# Patient Record
Sex: Female | Born: 1935 | Race: White | Hispanic: No | Smoking: Never smoker
Health system: Southern US, Community
[De-identification: ages and names within clinical notes are randomized; demographics above are authoritative.]

## PROBLEM LIST (undated history)

## (undated) DIAGNOSIS — E079 Disorder of thyroid, unspecified: Secondary | ICD-10-CM

## (undated) DIAGNOSIS — I714 Abdominal aortic aneurysm, without rupture, unspecified: Secondary | ICD-10-CM

## (undated) DIAGNOSIS — H409 Unspecified glaucoma: Secondary | ICD-10-CM

## (undated) DIAGNOSIS — F0390 Unspecified dementia without behavioral disturbance: Secondary | ICD-10-CM

## (undated) DIAGNOSIS — K5792 Diverticulitis of intestine, part unspecified, without perforation or abscess without bleeding: Secondary | ICD-10-CM

## (undated) DIAGNOSIS — I10 Essential (primary) hypertension: Secondary | ICD-10-CM

## (undated) DIAGNOSIS — R32 Unspecified urinary incontinence: Secondary | ICD-10-CM

## (undated) HISTORY — DX: Essential (primary) hypertension: I10

## (undated) HISTORY — PX: HIP SURGERY: SHX245

## (undated) HISTORY — DX: Disorder of thyroid, unspecified: E07.9

## (undated) HISTORY — DX: Unspecified glaucoma: H40.9

## (undated) HISTORY — DX: Diverticulitis of intestine, part unspecified, without perforation or abscess without bleeding: K57.92

## (undated) HISTORY — DX: Unspecified urinary incontinence: R32

## (undated) HISTORY — PX: BLADDER SURGERY: SHX569

---

## 2013-04-18 DIAGNOSIS — I8310 Varicose veins of unspecified lower extremity with inflammation: Secondary | ICD-10-CM | POA: Diagnosis not present

## 2013-04-18 DIAGNOSIS — H61009 Unspecified perichondritis of external ear, unspecified ear: Secondary | ICD-10-CM | POA: Diagnosis not present

## 2013-04-18 DIAGNOSIS — B351 Tinea unguium: Secondary | ICD-10-CM | POA: Diagnosis not present

## 2013-05-17 DIAGNOSIS — R7989 Other specified abnormal findings of blood chemistry: Secondary | ICD-10-CM | POA: Diagnosis not present

## 2013-05-17 DIAGNOSIS — Z880 Allergy status to penicillin: Secondary | ICD-10-CM | POA: Diagnosis not present

## 2013-05-17 DIAGNOSIS — A498 Other bacterial infections of unspecified site: Secondary | ICD-10-CM | POA: Diagnosis present

## 2013-05-17 DIAGNOSIS — E039 Hypothyroidism, unspecified: Secondary | ICD-10-CM | POA: Diagnosis present

## 2013-05-17 DIAGNOSIS — Z7982 Long term (current) use of aspirin: Secondary | ICD-10-CM | POA: Diagnosis not present

## 2013-05-17 DIAGNOSIS — Z882 Allergy status to sulfonamides status: Secondary | ICD-10-CM | POA: Diagnosis not present

## 2013-05-17 DIAGNOSIS — R0902 Hypoxemia: Secondary | ICD-10-CM | POA: Diagnosis not present

## 2013-05-17 DIAGNOSIS — E876 Hypokalemia: Secondary | ICD-10-CM | POA: Diagnosis not present

## 2013-05-17 DIAGNOSIS — I10 Essential (primary) hypertension: Secondary | ICD-10-CM | POA: Diagnosis present

## 2013-05-17 DIAGNOSIS — E860 Dehydration: Secondary | ICD-10-CM | POA: Diagnosis not present

## 2013-05-17 DIAGNOSIS — J189 Pneumonia, unspecified organism: Secondary | ICD-10-CM | POA: Diagnosis not present

## 2013-05-17 DIAGNOSIS — R05 Cough: Secondary | ICD-10-CM | POA: Diagnosis not present

## 2013-05-17 DIAGNOSIS — N179 Acute kidney failure, unspecified: Secondary | ICD-10-CM | POA: Diagnosis not present

## 2013-05-17 DIAGNOSIS — N390 Urinary tract infection, site not specified: Secondary | ICD-10-CM | POA: Diagnosis not present

## 2013-05-17 DIAGNOSIS — R5381 Other malaise: Secondary | ICD-10-CM | POA: Diagnosis not present

## 2013-05-17 DIAGNOSIS — F0390 Unspecified dementia without behavioral disturbance: Secondary | ICD-10-CM | POA: Diagnosis not present

## 2013-05-17 DIAGNOSIS — Z91013 Allergy to seafood: Secondary | ICD-10-CM | POA: Diagnosis not present

## 2013-05-17 DIAGNOSIS — R059 Cough, unspecified: Secondary | ICD-10-CM | POA: Diagnosis not present

## 2013-05-17 DIAGNOSIS — R4182 Altered mental status, unspecified: Secondary | ICD-10-CM | POA: Diagnosis not present

## 2013-05-27 DIAGNOSIS — I10 Essential (primary) hypertension: Secondary | ICD-10-CM | POA: Diagnosis not present

## 2013-05-27 DIAGNOSIS — F0390 Unspecified dementia without behavioral disturbance: Secondary | ICD-10-CM | POA: Diagnosis not present

## 2013-05-27 DIAGNOSIS — E039 Hypothyroidism, unspecified: Secondary | ICD-10-CM | POA: Diagnosis not present

## 2013-05-27 DIAGNOSIS — R609 Edema, unspecified: Secondary | ICD-10-CM | POA: Diagnosis not present

## 2013-06-06 DIAGNOSIS — R609 Edema, unspecified: Secondary | ICD-10-CM | POA: Diagnosis not present

## 2013-07-11 DIAGNOSIS — L98499 Non-pressure chronic ulcer of skin of other sites with unspecified severity: Secondary | ICD-10-CM | POA: Diagnosis not present

## 2013-07-11 DIAGNOSIS — I8310 Varicose veins of unspecified lower extremity with inflammation: Secondary | ICD-10-CM | POA: Diagnosis not present

## 2013-07-11 DIAGNOSIS — H61009 Unspecified perichondritis of external ear, unspecified ear: Secondary | ICD-10-CM | POA: Diagnosis not present

## 2013-07-16 DIAGNOSIS — N390 Urinary tract infection, site not specified: Secondary | ICD-10-CM | POA: Diagnosis not present

## 2013-08-26 DIAGNOSIS — M549 Dorsalgia, unspecified: Secondary | ICD-10-CM | POA: Diagnosis not present

## 2013-09-05 DIAGNOSIS — D649 Anemia, unspecified: Secondary | ICD-10-CM | POA: Diagnosis not present

## 2013-09-05 DIAGNOSIS — E039 Hypothyroidism, unspecified: Secondary | ICD-10-CM | POA: Diagnosis not present

## 2013-09-05 DIAGNOSIS — Z5189 Encounter for other specified aftercare: Secondary | ICD-10-CM | POA: Diagnosis not present

## 2013-09-05 DIAGNOSIS — I10 Essential (primary) hypertension: Secondary | ICD-10-CM | POA: Diagnosis not present

## 2013-09-05 DIAGNOSIS — Z9181 History of falling: Secondary | ICD-10-CM | POA: Diagnosis not present

## 2013-09-05 DIAGNOSIS — F0390 Unspecified dementia without behavioral disturbance: Secondary | ICD-10-CM | POA: Diagnosis not present

## 2013-09-09 DIAGNOSIS — E039 Hypothyroidism, unspecified: Secondary | ICD-10-CM | POA: Diagnosis not present

## 2013-09-09 DIAGNOSIS — F0390 Unspecified dementia without behavioral disturbance: Secondary | ICD-10-CM | POA: Diagnosis not present

## 2013-09-09 DIAGNOSIS — D649 Anemia, unspecified: Secondary | ICD-10-CM | POA: Diagnosis not present

## 2013-09-09 DIAGNOSIS — Z9181 History of falling: Secondary | ICD-10-CM | POA: Diagnosis not present

## 2013-09-09 DIAGNOSIS — Z5189 Encounter for other specified aftercare: Secondary | ICD-10-CM | POA: Diagnosis not present

## 2013-09-09 DIAGNOSIS — I10 Essential (primary) hypertension: Secondary | ICD-10-CM | POA: Diagnosis not present

## 2013-09-16 DIAGNOSIS — E039 Hypothyroidism, unspecified: Secondary | ICD-10-CM | POA: Diagnosis not present

## 2013-09-16 DIAGNOSIS — I10 Essential (primary) hypertension: Secondary | ICD-10-CM | POA: Diagnosis not present

## 2013-09-16 DIAGNOSIS — F0390 Unspecified dementia without behavioral disturbance: Secondary | ICD-10-CM | POA: Diagnosis not present

## 2013-09-16 DIAGNOSIS — R609 Edema, unspecified: Secondary | ICD-10-CM | POA: Diagnosis not present

## 2013-09-18 DIAGNOSIS — Z5189 Encounter for other specified aftercare: Secondary | ICD-10-CM | POA: Diagnosis not present

## 2013-09-18 DIAGNOSIS — F0390 Unspecified dementia without behavioral disturbance: Secondary | ICD-10-CM | POA: Diagnosis not present

## 2013-09-18 DIAGNOSIS — Z9181 History of falling: Secondary | ICD-10-CM | POA: Diagnosis not present

## 2013-09-18 DIAGNOSIS — E039 Hypothyroidism, unspecified: Secondary | ICD-10-CM | POA: Diagnosis not present

## 2013-09-18 DIAGNOSIS — D649 Anemia, unspecified: Secondary | ICD-10-CM | POA: Diagnosis not present

## 2013-09-18 DIAGNOSIS — I10 Essential (primary) hypertension: Secondary | ICD-10-CM | POA: Diagnosis not present

## 2013-09-23 DIAGNOSIS — F0390 Unspecified dementia without behavioral disturbance: Secondary | ICD-10-CM | POA: Diagnosis not present

## 2013-09-23 DIAGNOSIS — D649 Anemia, unspecified: Secondary | ICD-10-CM | POA: Diagnosis not present

## 2013-09-23 DIAGNOSIS — I10 Essential (primary) hypertension: Secondary | ICD-10-CM | POA: Diagnosis not present

## 2013-09-23 DIAGNOSIS — Z9181 History of falling: Secondary | ICD-10-CM | POA: Diagnosis not present

## 2013-09-23 DIAGNOSIS — E039 Hypothyroidism, unspecified: Secondary | ICD-10-CM | POA: Diagnosis not present

## 2013-09-23 DIAGNOSIS — Z5189 Encounter for other specified aftercare: Secondary | ICD-10-CM | POA: Diagnosis not present

## 2013-09-24 DIAGNOSIS — D649 Anemia, unspecified: Secondary | ICD-10-CM | POA: Diagnosis not present

## 2013-09-24 DIAGNOSIS — I10 Essential (primary) hypertension: Secondary | ICD-10-CM | POA: Diagnosis not present

## 2013-09-24 DIAGNOSIS — E039 Hypothyroidism, unspecified: Secondary | ICD-10-CM | POA: Diagnosis not present

## 2013-09-24 DIAGNOSIS — F0390 Unspecified dementia without behavioral disturbance: Secondary | ICD-10-CM | POA: Diagnosis not present

## 2013-09-24 DIAGNOSIS — Z5189 Encounter for other specified aftercare: Secondary | ICD-10-CM | POA: Diagnosis not present

## 2013-09-24 DIAGNOSIS — Z9181 History of falling: Secondary | ICD-10-CM | POA: Diagnosis not present

## 2013-09-29 DIAGNOSIS — F0390 Unspecified dementia without behavioral disturbance: Secondary | ICD-10-CM | POA: Diagnosis not present

## 2013-09-29 DIAGNOSIS — I10 Essential (primary) hypertension: Secondary | ICD-10-CM | POA: Diagnosis not present

## 2013-09-29 DIAGNOSIS — D649 Anemia, unspecified: Secondary | ICD-10-CM | POA: Diagnosis not present

## 2013-09-29 DIAGNOSIS — E039 Hypothyroidism, unspecified: Secondary | ICD-10-CM | POA: Diagnosis not present

## 2013-09-30 DIAGNOSIS — F0390 Unspecified dementia without behavioral disturbance: Secondary | ICD-10-CM | POA: Diagnosis not present

## 2013-09-30 DIAGNOSIS — E039 Hypothyroidism, unspecified: Secondary | ICD-10-CM | POA: Diagnosis not present

## 2013-09-30 DIAGNOSIS — I10 Essential (primary) hypertension: Secondary | ICD-10-CM | POA: Diagnosis not present

## 2013-09-30 DIAGNOSIS — D649 Anemia, unspecified: Secondary | ICD-10-CM | POA: Diagnosis not present

## 2013-09-30 DIAGNOSIS — Z9181 History of falling: Secondary | ICD-10-CM | POA: Diagnosis not present

## 2013-09-30 DIAGNOSIS — Z5189 Encounter for other specified aftercare: Secondary | ICD-10-CM | POA: Diagnosis not present

## 2013-11-21 DIAGNOSIS — T148XXA Other injury of unspecified body region, initial encounter: Secondary | ICD-10-CM | POA: Diagnosis not present

## 2013-11-21 DIAGNOSIS — K5909 Other constipation: Secondary | ICD-10-CM | POA: Diagnosis not present

## 2013-11-21 DIAGNOSIS — K219 Gastro-esophageal reflux disease without esophagitis: Secondary | ICD-10-CM | POA: Diagnosis not present

## 2013-11-21 DIAGNOSIS — S34109A Unspecified injury to unspecified level of lumbar spinal cord, initial encounter: Secondary | ICD-10-CM | POA: Diagnosis not present

## 2013-11-21 DIAGNOSIS — M25559 Pain in unspecified hip: Secondary | ICD-10-CM | POA: Diagnosis not present

## 2013-11-21 DIAGNOSIS — A498 Other bacterial infections of unspecified site: Secondary | ICD-10-CM | POA: Diagnosis not present

## 2013-11-21 DIAGNOSIS — R339 Retention of urine, unspecified: Secondary | ICD-10-CM | POA: Diagnosis present

## 2013-11-21 DIAGNOSIS — F0390 Unspecified dementia without behavioral disturbance: Secondary | ICD-10-CM | POA: Diagnosis not present

## 2013-11-21 DIAGNOSIS — K449 Diaphragmatic hernia without obstruction or gangrene: Secondary | ICD-10-CM | POA: Diagnosis not present

## 2013-11-21 DIAGNOSIS — S72143A Displaced intertrochanteric fracture of unspecified femur, initial encounter for closed fracture: Secondary | ICD-10-CM | POA: Diagnosis not present

## 2013-11-21 DIAGNOSIS — Z9181 History of falling: Secondary | ICD-10-CM | POA: Diagnosis not present

## 2013-11-21 DIAGNOSIS — R918 Other nonspecific abnormal finding of lung field: Secondary | ICD-10-CM | POA: Diagnosis not present

## 2013-11-21 DIAGNOSIS — L89109 Pressure ulcer of unspecified part of back, unspecified stage: Secondary | ICD-10-CM | POA: Diagnosis not present

## 2013-11-21 DIAGNOSIS — Z66 Do not resuscitate: Secondary | ICD-10-CM | POA: Diagnosis present

## 2013-11-21 DIAGNOSIS — M6281 Muscle weakness (generalized): Secondary | ICD-10-CM | POA: Diagnosis not present

## 2013-11-21 DIAGNOSIS — M47812 Spondylosis without myelopathy or radiculopathy, cervical region: Secondary | ICD-10-CM | POA: Diagnosis not present

## 2013-11-21 DIAGNOSIS — E039 Hypothyroidism, unspecified: Secondary | ICD-10-CM | POA: Diagnosis present

## 2013-11-21 DIAGNOSIS — S72009D Fracture of unspecified part of neck of unspecified femur, subsequent encounter for closed fracture with routine healing: Secondary | ICD-10-CM | POA: Diagnosis not present

## 2013-11-21 DIAGNOSIS — Z471 Aftercare following joint replacement surgery: Secondary | ICD-10-CM | POA: Diagnosis not present

## 2013-11-21 DIAGNOSIS — R262 Difficulty in walking, not elsewhere classified: Secondary | ICD-10-CM | POA: Diagnosis not present

## 2013-11-21 DIAGNOSIS — N390 Urinary tract infection, site not specified: Secondary | ICD-10-CM | POA: Diagnosis not present

## 2013-11-21 DIAGNOSIS — L8992 Pressure ulcer of unspecified site, stage 2: Secondary | ICD-10-CM | POA: Diagnosis not present

## 2013-11-21 DIAGNOSIS — R279 Unspecified lack of coordination: Secondary | ICD-10-CM | POA: Diagnosis not present

## 2013-11-21 DIAGNOSIS — Z043 Encounter for examination and observation following other accident: Secondary | ICD-10-CM | POA: Diagnosis not present

## 2013-11-21 DIAGNOSIS — S72009A Fracture of unspecified part of neck of unspecified femur, initial encounter for closed fracture: Secondary | ICD-10-CM | POA: Diagnosis not present

## 2013-11-21 DIAGNOSIS — I10 Essential (primary) hypertension: Secondary | ICD-10-CM | POA: Diagnosis not present

## 2013-11-21 DIAGNOSIS — D62 Acute posthemorrhagic anemia: Secondary | ICD-10-CM | POA: Diagnosis not present

## 2013-11-27 DIAGNOSIS — S72143A Displaced intertrochanteric fracture of unspecified femur, initial encounter for closed fracture: Secondary | ICD-10-CM | POA: Diagnosis not present

## 2013-11-27 DIAGNOSIS — F0390 Unspecified dementia without behavioral disturbance: Secondary | ICD-10-CM | POA: Diagnosis not present

## 2013-11-27 DIAGNOSIS — K219 Gastro-esophageal reflux disease without esophagitis: Secondary | ICD-10-CM | POA: Diagnosis not present

## 2013-11-27 DIAGNOSIS — K5900 Constipation, unspecified: Secondary | ICD-10-CM | POA: Diagnosis not present

## 2013-11-27 DIAGNOSIS — N390 Urinary tract infection, site not specified: Secondary | ICD-10-CM | POA: Diagnosis not present

## 2013-11-27 DIAGNOSIS — F411 Generalized anxiety disorder: Secondary | ICD-10-CM | POA: Diagnosis not present

## 2013-11-27 DIAGNOSIS — Z471 Aftercare following joint replacement surgery: Secondary | ICD-10-CM | POA: Diagnosis not present

## 2013-11-27 DIAGNOSIS — D62 Acute posthemorrhagic anemia: Secondary | ICD-10-CM | POA: Diagnosis not present

## 2013-11-27 DIAGNOSIS — M25559 Pain in unspecified hip: Secondary | ICD-10-CM | POA: Diagnosis not present

## 2013-11-27 DIAGNOSIS — L8992 Pressure ulcer of unspecified site, stage 2: Secondary | ICD-10-CM | POA: Diagnosis not present

## 2013-11-27 DIAGNOSIS — E039 Hypothyroidism, unspecified: Secondary | ICD-10-CM | POA: Diagnosis not present

## 2013-11-27 DIAGNOSIS — R262 Difficulty in walking, not elsewhere classified: Secondary | ICD-10-CM | POA: Diagnosis not present

## 2013-11-27 DIAGNOSIS — M79609 Pain in unspecified limb: Secondary | ICD-10-CM | POA: Diagnosis not present

## 2013-11-27 DIAGNOSIS — I10 Essential (primary) hypertension: Secondary | ICD-10-CM | POA: Diagnosis not present

## 2013-11-27 DIAGNOSIS — B351 Tinea unguium: Secondary | ICD-10-CM | POA: Diagnosis not present

## 2013-11-27 DIAGNOSIS — K5909 Other constipation: Secondary | ICD-10-CM | POA: Diagnosis not present

## 2013-11-27 DIAGNOSIS — M6281 Muscle weakness (generalized): Secondary | ICD-10-CM | POA: Diagnosis not present

## 2013-11-27 DIAGNOSIS — R279 Unspecified lack of coordination: Secondary | ICD-10-CM | POA: Diagnosis not present

## 2013-11-27 DIAGNOSIS — L89109 Pressure ulcer of unspecified part of back, unspecified stage: Secondary | ICD-10-CM | POA: Diagnosis not present

## 2013-11-27 DIAGNOSIS — S72009D Fracture of unspecified part of neck of unspecified femur, subsequent encounter for closed fracture with routine healing: Secondary | ICD-10-CM | POA: Diagnosis not present

## 2013-11-27 DIAGNOSIS — Z9181 History of falling: Secondary | ICD-10-CM | POA: Diagnosis not present

## 2013-11-29 ENCOUNTER — Encounter: Payer: Self-pay | Admitting: Adult Health

## 2013-11-29 ENCOUNTER — Non-Acute Institutional Stay (SKILLED_NURSING_FACILITY): Payer: Medicare Other | Admitting: Adult Health

## 2013-11-29 DIAGNOSIS — E039 Hypothyroidism, unspecified: Secondary | ICD-10-CM | POA: Insufficient documentation

## 2013-11-29 DIAGNOSIS — K219 Gastro-esophageal reflux disease without esophagitis: Secondary | ICD-10-CM | POA: Diagnosis not present

## 2013-11-29 DIAGNOSIS — I10 Essential (primary) hypertension: Secondary | ICD-10-CM

## 2013-11-29 DIAGNOSIS — S72141A Displaced intertrochanteric fracture of right femur, initial encounter for closed fracture: Secondary | ICD-10-CM | POA: Insufficient documentation

## 2013-11-29 DIAGNOSIS — F419 Anxiety disorder, unspecified: Secondary | ICD-10-CM | POA: Insufficient documentation

## 2013-11-29 DIAGNOSIS — F411 Generalized anxiety disorder: Secondary | ICD-10-CM

## 2013-11-29 DIAGNOSIS — S72143A Displaced intertrochanteric fracture of unspecified femur, initial encounter for closed fracture: Secondary | ICD-10-CM | POA: Diagnosis not present

## 2013-11-29 DIAGNOSIS — N390 Urinary tract infection, site not specified: Secondary | ICD-10-CM

## 2013-11-29 DIAGNOSIS — K5900 Constipation, unspecified: Secondary | ICD-10-CM

## 2013-11-29 DIAGNOSIS — F0390 Unspecified dementia without behavioral disturbance: Secondary | ICD-10-CM

## 2013-11-29 DIAGNOSIS — D62 Acute posthemorrhagic anemia: Secondary | ICD-10-CM

## 2013-11-29 NOTE — Progress Notes (Signed)
Patient ID: Virginia Davidson, female   DOB: 24-Jun-1916, 80 y.o.   MRN: [id number]               PROGRESS NOTE  DATE: 11/29/2013  FACILITY: Nursing Home Location: [facility]  LEVEL OF CARE: SNF (31)  Acute Visit  CHIEF COMPLAINT:  Follow-up Hospitalization  HISTORY OF PRESENT ILLNESS: This is a 80 year old female who has been admitted to [facility] on 11/27/13 from [facility] in [place] with her I intratrochanteric fracture, right hip status post ORIF and IM screw. She has been admitted for a short-term rehabilitation.    REASSESSMENT OF ONGOING PROBLEM(S):  HTN: Pt 's HTN remains stable.  Denies CP, sob, DOE, pedal edema, headaches, dizziness or visual disturbances.  No complications from the medications currently being used.  Last BP : 118/60  UTI: The UTI remains stable.  The patient denies ongoing suprapubic pain, flank pain, dysuria, urinary frequency, urinary hesitancy or hematuria.  No complications reported from the current antibiotic being used.  DEMENTIA: The dementia remaines stable and continues to function adequately in the current living environment with supervision.  The patient has had little changes in behavior. No complications noted from the medications presently being used.   PAST MEDICAL HISTORY : Reviewed.  No changes/see problem list  CURRENT MEDICATIONS: Reviewed per MAR/see medication list  REVIEW OF SYSTEMS: unable to asses; patient is sleepy  PHYSICAL EXAMINATION  GENERAL: no acute distress, normal body habitus SKIN:  Right hip surgical site is dry, no erythema NECK: supple, trachea midline, no neck masses, no thyroid tenderness, no thyromegaly LYMPHATICS: no LAN in the neck, no supraclavicular LAN RESPIRATORY: breathing is even & unlabored, BS CTAB CARDIAC: RRR, no murmur,no extra heart sounds, no edema GI: abdomen soft, normal BS, no masses, no tenderness, no hepatomegaly, no splenomegaly PSYCHIATRIC: the  patient is alert & oriented to person, affect & behavior appropriate  LABS/RADIOLOGY: 11/24/13  WBC 8.1 hemoglobin 10.3 hematocrit 31.5 MCV 95.7 11/21/13  sodium 141 potassium 3.5 BUN 27 creatinine 1.10 calcium 9.7 glucose 162  ASSESSMENT/PLAN:  Right intertrochanteric fracture, right  status post ORIF and IM Screw - for rehabilitation Anxiety - decrease Ativan to 0.25 mg by mouth each bedtime when necessary Right hip pain - discontinue Norco due to sedation; start old from 50 mg daily one half tab = 25 mg by mouth every 4 hours when necessary UTI - continue Ceftin Dementia - continue Namenda XR Constipation - continue Colace Hypertension - well controlled; continue lisinopril GERD - continue Prilosec Anemia, acute blood loss - check CBC Hypothyroidism - continue Synthroid   CPT CODE: 70962  Lyan Holck Vargas - NP [facility] [phone]

## 2013-12-03 ENCOUNTER — Non-Acute Institutional Stay (SKILLED_NURSING_FACILITY): Payer: Medicare Other | Admitting: Internal Medicine

## 2013-12-03 DIAGNOSIS — S72143A Displaced intertrochanteric fracture of unspecified femur, initial encounter for closed fracture: Secondary | ICD-10-CM | POA: Diagnosis not present

## 2013-12-03 DIAGNOSIS — S72141A Displaced intertrochanteric fracture of right femur, initial encounter for closed fracture: Secondary | ICD-10-CM

## 2013-12-03 DIAGNOSIS — D62 Acute posthemorrhagic anemia: Secondary | ICD-10-CM

## 2013-12-03 DIAGNOSIS — F0390 Unspecified dementia without behavioral disturbance: Secondary | ICD-10-CM | POA: Diagnosis not present

## 2013-12-03 DIAGNOSIS — K5900 Constipation, unspecified: Secondary | ICD-10-CM

## 2013-12-11 DIAGNOSIS — S72143A Displaced intertrochanteric fracture of unspecified femur, initial encounter for closed fracture: Secondary | ICD-10-CM | POA: Diagnosis not present

## 2013-12-12 NOTE — Progress Notes (Signed)
Patient ID: Virginia Davidson, female   DOB: [birth year], 80 y.o.   MRN: [id number]                HISTORY & PHYSICAL  DATE: 12/03/2013            FACILITY: [facility]  LEVEL OF CARE: SNF (31)  ALLERGIES:   Penicillins.    Sulfa.    Shellfish.    Tetracycline.    CHIEF COMPLAINT:  Manage right hip fracture, acute blood loss anemia, and dementia.    HISTORY OF PRESENT ILLNESS:  Patient is a 80 year-old, Caucasian female.    HIP FRACTURE: The patient had a mechanical fall and sustained a femur fracture.  Patient subsequently underwent surgical repair and tolerated the procedure well. Patient is admitted to this facility for short-term rehabilitation. Patient denies hip pain currently. No complications reported from the pain medications currently being used.    ANEMIA:  Postoperatively, patient suffered acute blood loss.  The anemia has been stable. The patient denies fatigue, melena or hematochezia. No complications from the medications currently being used.  Last hemoglobin was 10.3.     DEMENTIA: The dementia remains stable and continues to function adequately in the current living environment with supervision.  The patient has had little changes in behavior. No complications noted from the medications presently being used.  Overall, patient is a poor historian.    PAST MEDICAL HISTORY :   Dementia.    Hypertension.    Hypothyroidism.     GERD.    Hypokalemia.    Anxiety.    PAST SURGICAL HISTORY:  Bladder surgery x2.    SOCIAL HISTORY:  reports that she has never smoked. She has never used smokeless tobacco. She reports that she does not drink alcohol or use illicit drugs.  FAMILY HISTORY:   Patient cannot recall.    CURRENT MEDICATIONS: Reviewed per MAR/see medication list  REVIEW OF SYSTEMS:  Difficult to obtain.  Patient is a poor historian due to dementia.  However, she does complain of constipation.    See HPI otherwise 14 point ROS is  negative.  PHYSICAL EXAMINATION  VS:  See VS section  GENERAL: no acute distress, normal body habitus EYES: conjunctivae normal, sclerae normal, normal eye lids MOUTH/THROAT: lips without lesions,no lesions in the mouth,tongue is without lesions,uvula elevates in midline NECK: supple, trachea midline, no neck masses, no thyroid tenderness, no thyromegaly LYMPHATICS: no LAN in the neck, no supraclavicular LAN RESPIRATORY: breathing is even & unlabored, BS CTAB CARDIAC: RRR, no murmur,no extra heart sounds, no edema GI:  ABDOMEN: abdomen soft, normal BS, no masses, no tenderness  LIVER/SPLEEN: no hepatomegaly, no splenomegaly MUSCULOSKELETAL: HEAD: normal to inspection  EXTREMITIES: LEFT UPPER EXTREMITY: strength decreased, range of motion normal   RIGHT UPPER EXTREMITY: strength decreased, range of motion normal    LEFT LOWER EXTREMITY: unable to assess   RIGHT LOWER EXTREMITY: not tested due to surgery     PSYCHIATRIC: the patient is alert & oriented to person, affect & behavior appropriate  LABS/RADIOLOGY:  WBC 8.1, hemoglobin 10.3, platelets 182.    Creatinine 1.1.    Electrolytes normal.     Urine culture:  Positive for E.coli.    11/21/2013:  Chest x-ray:  Hiatal hernia.  Left lung base parenchymal opacity.    Right hip x-ray:  Right femur intertrochanteric fracture  ASSESSMENT/PLAN:  Right hip intertrochanteric fracture.  Status post ORIF.  Continue rehabilitation.    Acute blood loss anemia.  Recheck hemoglobin.    Dementia.  Stable.    Constipation.  New problem.  Start MiraLAX 17 g q.d.    Hypothyroidism.  Continue levothyroxine.    Hiatal hernia.  Continue Prilosec.    Hypokalemia.  Continue potassium supplementation.     Hypertension.  Well controlled.     Check CBC with diff and BMP.    I have reviewed patient's medical records received at admission/from hospitalization.  CPT CODE: 93716           Laura Caldas Y Kiki Bivens, [place] [phone]

## 2013-12-13 ENCOUNTER — Encounter: Payer: Self-pay | Admitting: *Deleted

## 2013-12-18 ENCOUNTER — Other Ambulatory Visit: Payer: Self-pay | Admitting: *Deleted

## 2013-12-18 MED ORDER — TRAMADOL HCL 50 MG PO TABS: ORAL_TABLET | ORAL | Status: DC

## 2013-12-18 NOTE — Telephone Encounter (Signed)
[facility] 

## 2013-12-24 ENCOUNTER — Encounter: Payer: Self-pay | Admitting: Adult Health

## 2013-12-24 ENCOUNTER — Non-Acute Institutional Stay (SKILLED_NURSING_FACILITY): Payer: Medicare Other | Admitting: Adult Health

## 2013-12-24 DIAGNOSIS — F0390 Unspecified dementia without behavioral disturbance: Secondary | ICD-10-CM | POA: Diagnosis not present

## 2013-12-24 DIAGNOSIS — I10 Essential (primary) hypertension: Secondary | ICD-10-CM | POA: Diagnosis not present

## 2013-12-24 DIAGNOSIS — F411 Generalized anxiety disorder: Secondary | ICD-10-CM | POA: Diagnosis not present

## 2013-12-24 DIAGNOSIS — S72009D Fracture of unspecified part of neck of unspecified femur, subsequent encounter for closed fracture with routine healing: Secondary | ICD-10-CM | POA: Diagnosis not present

## 2013-12-24 DIAGNOSIS — K219 Gastro-esophageal reflux disease without esophagitis: Secondary | ICD-10-CM

## 2013-12-24 DIAGNOSIS — S72141D Displaced intertrochanteric fracture of right femur, subsequent encounter for closed fracture with routine healing: Secondary | ICD-10-CM

## 2013-12-24 DIAGNOSIS — K5900 Constipation, unspecified: Secondary | ICD-10-CM

## 2013-12-24 DIAGNOSIS — F419 Anxiety disorder, unspecified: Secondary | ICD-10-CM

## 2013-12-24 DIAGNOSIS — D62 Acute posthemorrhagic anemia: Secondary | ICD-10-CM

## 2013-12-24 NOTE — Progress Notes (Signed)
Patient ID: Virginia Davidson, female   DOB: 1917/01/21, 80 y.o.   MRN: [id number]              PROGRESS NOTE  DATE:       12/24/13  FACILITY: Nursing Home Location: [facility]  LEVEL OF CARE: SNF (31)  Acute Visit  Discharge Notes  HISTORY OF PRESENT ILLNESS: This is a 80 year old female who is for discharge home with home health PT, OT and Nursing . DME: Wheelchair  18" X 18"  with elevating leg rest, desk arms and cushion. She has been admitted to [facility] on 11/27/13 from  [facility] in [place] with intertrochanteric fracture, right hip status post ORIF and IM screw. Patient was admitted to this facility for short-term rehabilitation after the patient's recent hospitalization.  Patient has completed SNF rehabilitation and therapy has cleared the patient for discharge.  REASSESSMENT OF ONGOING PROBLEM(S):  HTN: Pt 's HTN remains stable.  Denies CP, sob, DOE, pedal edema, headaches, dizziness or visual disturbances.  No complications from the medications currently being used.  Last BP : 130/68  GERD: pt's GERD is stable.  Denies ongoing heartburn, abd. Pain, nausea or vomiting.  Currently on a PPI & tolerates it without any adverse reactions.  ANEMIA: The anemia has been stable. The patient denies fatigue, melena or hematochezia. No complications from the medications currently being used. 9/15 hgb 11.3  PAST MEDICAL HISTORY : Reviewed.  No changes/see problem list  CURRENT MEDICATIONS: Reviewed per MAR/see medication list  REVIEW OF SYSTEMS: unable to asses; patient is sleepy  PHYSICAL EXAMINATION  GENERAL: no acute distress, normal body habitus SKIN:  Right hip surgical site is dry, no erythema NECK: supple, trachea midline, no neck masses, no thyroid tenderness, no thyromegaly LYMPHATICS: no LAN in the neck, no supraclavicular LAN RESPIRATORY: breathing is even & unlabored, BS CTAB CARDIAC: RRR, no murmur,no extra heart sounds, no  edema GI: abdomen soft, normal BS, no masses, no tenderness, no hepatomegaly, no splenomegaly PSYCHIATRIC: the patient is alert & oriented to person, affect & behavior appropriate  LABS/RADIOLOGY: 12/03/13  WBC 7.9 hemoglobin 11.3 hematocrit 38.2 MCV 101.1 11/24/13  WBC 8.1 hemoglobin 10.3 hematocrit 31.5 MCV 95.7 11/21/13  sodium 141 potassium 3.5 BUN 27 creatinine 1.10 calcium 9.7 glucose 162  ASSESSMENT/PLAN:  Right intertrochanteric fracture, right  status post ORIF and IM Screw - for home health PT, OT and Nursing Anxiety - decrease Ativan to 0.25 mg by mouth each bedtime when necessary Dementia - continue Namenda XR Constipation - continue Colace Hypertension - well controlled; continue lisinopril GERD - continue Prilosec Anemia, acute blood loss - stable Hypothyroidism - continue Synthroid    I have filled out patient's discharge paperwork and written prescriptions.  Patient will receive home health PT, OT and Nursing.  DME provided:  Wheelchair  18" X 18"  with elevating leg rest, desk arms and cushion  Total discharge time: Greater than 30 minutes  Discharge time involved coordination of the discharge process with social worker, nursing staff and therapy department. Medical justification for home health services/DME verified.  CPT CODE: 48016  Seth Bake - NP [facility] [phone]

## 2013-12-26 DIAGNOSIS — G309 Alzheimer's disease, unspecified: Secondary | ICD-10-CM | POA: Diagnosis not present

## 2013-12-26 DIAGNOSIS — E559 Vitamin D deficiency, unspecified: Secondary | ICD-10-CM | POA: Diagnosis not present

## 2013-12-26 DIAGNOSIS — E039 Hypothyroidism, unspecified: Secondary | ICD-10-CM | POA: Diagnosis not present

## 2013-12-26 DIAGNOSIS — D649 Anemia, unspecified: Secondary | ICD-10-CM | POA: Diagnosis not present

## 2013-12-26 DIAGNOSIS — D539 Nutritional anemia, unspecified: Secondary | ICD-10-CM | POA: Diagnosis not present

## 2013-12-26 DIAGNOSIS — F0390 Unspecified dementia without behavioral disturbance: Secondary | ICD-10-CM | POA: Diagnosis not present

## 2013-12-26 DIAGNOSIS — Z23 Encounter for immunization: Secondary | ICD-10-CM | POA: Diagnosis not present

## 2013-12-27 DIAGNOSIS — I10 Essential (primary) hypertension: Secondary | ICD-10-CM | POA: Diagnosis not present

## 2013-12-27 DIAGNOSIS — F0390 Unspecified dementia without behavioral disturbance: Secondary | ICD-10-CM

## 2013-12-27 DIAGNOSIS — L89152 Pressure ulcer of sacral region, stage 2: Secondary | ICD-10-CM

## 2013-12-27 DIAGNOSIS — S72141D Displaced intertrochanteric fracture of right femur, subsequent encounter for closed fracture with routine healing: Secondary | ICD-10-CM

## 2013-12-27 DIAGNOSIS — R269 Unspecified abnormalities of gait and mobility: Secondary | ICD-10-CM | POA: Diagnosis not present

## 2013-12-30 DIAGNOSIS — S72141D Displaced intertrochanteric fracture of right femur, subsequent encounter for closed fracture with routine healing: Secondary | ICD-10-CM | POA: Diagnosis not present

## 2013-12-30 DIAGNOSIS — I10 Essential (primary) hypertension: Secondary | ICD-10-CM | POA: Diagnosis not present

## 2013-12-30 DIAGNOSIS — R269 Unspecified abnormalities of gait and mobility: Secondary | ICD-10-CM | POA: Diagnosis not present

## 2013-12-30 DIAGNOSIS — L89152 Pressure ulcer of sacral region, stage 2: Secondary | ICD-10-CM | POA: Diagnosis not present

## 2013-12-30 DIAGNOSIS — F0390 Unspecified dementia without behavioral disturbance: Secondary | ICD-10-CM | POA: Diagnosis not present

## 2013-12-31 DIAGNOSIS — R413 Other amnesia: Secondary | ICD-10-CM | POA: Diagnosis not present

## 2014-01-01 DIAGNOSIS — L89152 Pressure ulcer of sacral region, stage 2: Secondary | ICD-10-CM | POA: Diagnosis not present

## 2014-01-01 DIAGNOSIS — R269 Unspecified abnormalities of gait and mobility: Secondary | ICD-10-CM | POA: Diagnosis not present

## 2014-01-01 DIAGNOSIS — S72141D Displaced intertrochanteric fracture of right femur, subsequent encounter for closed fracture with routine healing: Secondary | ICD-10-CM | POA: Diagnosis not present

## 2014-01-01 DIAGNOSIS — F0390 Unspecified dementia without behavioral disturbance: Secondary | ICD-10-CM | POA: Diagnosis not present

## 2014-01-01 DIAGNOSIS — I10 Essential (primary) hypertension: Secondary | ICD-10-CM | POA: Diagnosis not present

## 2014-01-03 DIAGNOSIS — I10 Essential (primary) hypertension: Secondary | ICD-10-CM | POA: Diagnosis not present

## 2014-01-03 DIAGNOSIS — F0390 Unspecified dementia without behavioral disturbance: Secondary | ICD-10-CM | POA: Diagnosis not present

## 2014-01-03 DIAGNOSIS — L89152 Pressure ulcer of sacral region, stage 2: Secondary | ICD-10-CM | POA: Diagnosis not present

## 2014-01-03 DIAGNOSIS — R269 Unspecified abnormalities of gait and mobility: Secondary | ICD-10-CM | POA: Diagnosis not present

## 2014-01-03 DIAGNOSIS — S72141D Displaced intertrochanteric fracture of right femur, subsequent encounter for closed fracture with routine healing: Secondary | ICD-10-CM | POA: Diagnosis not present

## 2014-01-05 DIAGNOSIS — G309 Alzheimer's disease, unspecified: Secondary | ICD-10-CM | POA: Diagnosis not present

## 2014-01-05 DIAGNOSIS — E039 Hypothyroidism, unspecified: Secondary | ICD-10-CM | POA: Diagnosis not present

## 2014-01-05 DIAGNOSIS — F0390 Unspecified dementia without behavioral disturbance: Secondary | ICD-10-CM | POA: Diagnosis not present

## 2014-01-05 DIAGNOSIS — I10 Essential (primary) hypertension: Secondary | ICD-10-CM | POA: Diagnosis not present

## 2014-01-06 DIAGNOSIS — S72141D Displaced intertrochanteric fracture of right femur, subsequent encounter for closed fracture with routine healing: Secondary | ICD-10-CM | POA: Diagnosis not present

## 2014-01-06 DIAGNOSIS — R269 Unspecified abnormalities of gait and mobility: Secondary | ICD-10-CM | POA: Diagnosis not present

## 2014-01-06 DIAGNOSIS — L89152 Pressure ulcer of sacral region, stage 2: Secondary | ICD-10-CM | POA: Diagnosis not present

## 2014-01-06 DIAGNOSIS — F0390 Unspecified dementia without behavioral disturbance: Secondary | ICD-10-CM | POA: Diagnosis not present

## 2014-01-06 DIAGNOSIS — I10 Essential (primary) hypertension: Secondary | ICD-10-CM | POA: Diagnosis not present

## 2014-01-07 DIAGNOSIS — R3981 Functional urinary incontinence: Secondary | ICD-10-CM | POA: Diagnosis not present

## 2014-01-07 DIAGNOSIS — N390 Urinary tract infection, site not specified: Secondary | ICD-10-CM | POA: Diagnosis not present

## 2014-01-08 DIAGNOSIS — F0390 Unspecified dementia without behavioral disturbance: Secondary | ICD-10-CM | POA: Diagnosis not present

## 2014-01-08 DIAGNOSIS — R269 Unspecified abnormalities of gait and mobility: Secondary | ICD-10-CM | POA: Diagnosis not present

## 2014-01-08 DIAGNOSIS — S72141D Displaced intertrochanteric fracture of right femur, subsequent encounter for closed fracture with routine healing: Secondary | ICD-10-CM | POA: Diagnosis not present

## 2014-01-08 DIAGNOSIS — L89152 Pressure ulcer of sacral region, stage 2: Secondary | ICD-10-CM | POA: Diagnosis not present

## 2014-01-08 DIAGNOSIS — I10 Essential (primary) hypertension: Secondary | ICD-10-CM | POA: Diagnosis not present

## 2014-01-09 DIAGNOSIS — E8809 Other disorders of plasma-protein metabolism, not elsewhere classified: Secondary | ICD-10-CM | POA: Diagnosis not present

## 2014-01-09 DIAGNOSIS — E039 Hypothyroidism, unspecified: Secondary | ICD-10-CM | POA: Diagnosis not present

## 2014-01-09 DIAGNOSIS — E559 Vitamin D deficiency, unspecified: Secondary | ICD-10-CM | POA: Diagnosis not present

## 2014-01-09 DIAGNOSIS — F419 Anxiety disorder, unspecified: Secondary | ICD-10-CM | POA: Diagnosis not present

## 2014-01-10 DIAGNOSIS — S72141D Displaced intertrochanteric fracture of right femur, subsequent encounter for closed fracture with routine healing: Secondary | ICD-10-CM | POA: Diagnosis not present

## 2014-01-10 DIAGNOSIS — L89152 Pressure ulcer of sacral region, stage 2: Secondary | ICD-10-CM | POA: Diagnosis not present

## 2014-01-10 DIAGNOSIS — I10 Essential (primary) hypertension: Secondary | ICD-10-CM | POA: Diagnosis not present

## 2014-01-10 DIAGNOSIS — R269 Unspecified abnormalities of gait and mobility: Secondary | ICD-10-CM | POA: Diagnosis not present

## 2014-01-10 DIAGNOSIS — F0390 Unspecified dementia without behavioral disturbance: Secondary | ICD-10-CM | POA: Diagnosis not present

## 2014-01-13 DIAGNOSIS — L89152 Pressure ulcer of sacral region, stage 2: Secondary | ICD-10-CM | POA: Diagnosis not present

## 2014-01-13 DIAGNOSIS — R269 Unspecified abnormalities of gait and mobility: Secondary | ICD-10-CM | POA: Diagnosis not present

## 2014-01-13 DIAGNOSIS — I10 Essential (primary) hypertension: Secondary | ICD-10-CM | POA: Diagnosis not present

## 2014-01-13 DIAGNOSIS — S72141D Displaced intertrochanteric fracture of right femur, subsequent encounter for closed fracture with routine healing: Secondary | ICD-10-CM | POA: Diagnosis not present

## 2014-01-13 DIAGNOSIS — F0390 Unspecified dementia without behavioral disturbance: Secondary | ICD-10-CM | POA: Diagnosis not present

## 2014-01-14 DIAGNOSIS — R269 Unspecified abnormalities of gait and mobility: Secondary | ICD-10-CM | POA: Diagnosis not present

## 2014-01-14 DIAGNOSIS — L89152 Pressure ulcer of sacral region, stage 2: Secondary | ICD-10-CM | POA: Diagnosis not present

## 2014-01-14 DIAGNOSIS — F0390 Unspecified dementia without behavioral disturbance: Secondary | ICD-10-CM | POA: Diagnosis not present

## 2014-01-14 DIAGNOSIS — S72141D Displaced intertrochanteric fracture of right femur, subsequent encounter for closed fracture with routine healing: Secondary | ICD-10-CM | POA: Diagnosis not present

## 2014-01-14 DIAGNOSIS — I10 Essential (primary) hypertension: Secondary | ICD-10-CM | POA: Diagnosis not present

## 2014-01-15 DIAGNOSIS — S72143D Displaced intertrochanteric fracture of unspecified femur, subsequent encounter for closed fracture with routine healing: Secondary | ICD-10-CM | POA: Diagnosis not present

## 2014-01-15 DIAGNOSIS — F0390 Unspecified dementia without behavioral disturbance: Secondary | ICD-10-CM | POA: Diagnosis not present

## 2014-01-15 DIAGNOSIS — S72141D Displaced intertrochanteric fracture of right femur, subsequent encounter for closed fracture with routine healing: Secondary | ICD-10-CM | POA: Diagnosis not present

## 2014-01-15 DIAGNOSIS — I10 Essential (primary) hypertension: Secondary | ICD-10-CM | POA: Diagnosis not present

## 2014-01-15 DIAGNOSIS — L89152 Pressure ulcer of sacral region, stage 2: Secondary | ICD-10-CM | POA: Diagnosis not present

## 2014-01-15 DIAGNOSIS — R269 Unspecified abnormalities of gait and mobility: Secondary | ICD-10-CM | POA: Diagnosis not present

## 2014-01-16 DIAGNOSIS — I10 Essential (primary) hypertension: Secondary | ICD-10-CM | POA: Diagnosis not present

## 2014-01-16 DIAGNOSIS — F0390 Unspecified dementia without behavioral disturbance: Secondary | ICD-10-CM | POA: Diagnosis not present

## 2014-01-16 DIAGNOSIS — F419 Anxiety disorder, unspecified: Secondary | ICD-10-CM | POA: Diagnosis not present

## 2014-01-16 DIAGNOSIS — E559 Vitamin D deficiency, unspecified: Secondary | ICD-10-CM | POA: Diagnosis not present

## 2014-01-17 DIAGNOSIS — I10 Essential (primary) hypertension: Secondary | ICD-10-CM | POA: Diagnosis not present

## 2014-01-17 DIAGNOSIS — L89152 Pressure ulcer of sacral region, stage 2: Secondary | ICD-10-CM | POA: Diagnosis not present

## 2014-01-17 DIAGNOSIS — R269 Unspecified abnormalities of gait and mobility: Secondary | ICD-10-CM | POA: Diagnosis not present

## 2014-01-17 DIAGNOSIS — F0390 Unspecified dementia without behavioral disturbance: Secondary | ICD-10-CM | POA: Diagnosis not present

## 2014-01-17 DIAGNOSIS — S72141D Displaced intertrochanteric fracture of right femur, subsequent encounter for closed fracture with routine healing: Secondary | ICD-10-CM | POA: Diagnosis not present

## 2014-01-20 DIAGNOSIS — S72141D Displaced intertrochanteric fracture of right femur, subsequent encounter for closed fracture with routine healing: Secondary | ICD-10-CM | POA: Diagnosis not present

## 2014-01-20 DIAGNOSIS — B351 Tinea unguium: Secondary | ICD-10-CM | POA: Diagnosis not present

## 2014-01-20 DIAGNOSIS — I10 Essential (primary) hypertension: Secondary | ICD-10-CM | POA: Diagnosis not present

## 2014-01-20 DIAGNOSIS — K439 Ventral hernia without obstruction or gangrene: Secondary | ICD-10-CM | POA: Diagnosis not present

## 2014-01-20 DIAGNOSIS — M79609 Pain in unspecified limb: Secondary | ICD-10-CM | POA: Diagnosis not present

## 2014-01-20 DIAGNOSIS — F0390 Unspecified dementia without behavioral disturbance: Secondary | ICD-10-CM | POA: Diagnosis not present

## 2014-01-20 DIAGNOSIS — L89152 Pressure ulcer of sacral region, stage 2: Secondary | ICD-10-CM | POA: Diagnosis not present

## 2014-01-20 DIAGNOSIS — R269 Unspecified abnormalities of gait and mobility: Secondary | ICD-10-CM | POA: Diagnosis not present

## 2014-01-21 DIAGNOSIS — I10 Essential (primary) hypertension: Secondary | ICD-10-CM | POA: Diagnosis not present

## 2014-01-21 DIAGNOSIS — S72141D Displaced intertrochanteric fracture of right femur, subsequent encounter for closed fracture with routine healing: Secondary | ICD-10-CM | POA: Diagnosis not present

## 2014-01-21 DIAGNOSIS — L89152 Pressure ulcer of sacral region, stage 2: Secondary | ICD-10-CM | POA: Diagnosis not present

## 2014-01-21 DIAGNOSIS — F0390 Unspecified dementia without behavioral disturbance: Secondary | ICD-10-CM | POA: Diagnosis not present

## 2014-01-21 DIAGNOSIS — R269 Unspecified abnormalities of gait and mobility: Secondary | ICD-10-CM | POA: Diagnosis not present

## 2014-01-23 DIAGNOSIS — R269 Unspecified abnormalities of gait and mobility: Secondary | ICD-10-CM | POA: Diagnosis not present

## 2014-01-23 DIAGNOSIS — L89152 Pressure ulcer of sacral region, stage 2: Secondary | ICD-10-CM | POA: Diagnosis not present

## 2014-01-23 DIAGNOSIS — S72141D Displaced intertrochanteric fracture of right femur, subsequent encounter for closed fracture with routine healing: Secondary | ICD-10-CM | POA: Diagnosis not present

## 2014-01-23 DIAGNOSIS — F0390 Unspecified dementia without behavioral disturbance: Secondary | ICD-10-CM | POA: Diagnosis not present

## 2014-01-23 DIAGNOSIS — I10 Essential (primary) hypertension: Secondary | ICD-10-CM | POA: Diagnosis not present

## 2014-01-28 DIAGNOSIS — S72141D Displaced intertrochanteric fracture of right femur, subsequent encounter for closed fracture with routine healing: Secondary | ICD-10-CM | POA: Diagnosis not present

## 2014-01-28 DIAGNOSIS — I10 Essential (primary) hypertension: Secondary | ICD-10-CM | POA: Diagnosis not present

## 2014-01-28 DIAGNOSIS — F0390 Unspecified dementia without behavioral disturbance: Secondary | ICD-10-CM | POA: Diagnosis not present

## 2014-01-28 DIAGNOSIS — R269 Unspecified abnormalities of gait and mobility: Secondary | ICD-10-CM | POA: Diagnosis not present

## 2014-01-28 DIAGNOSIS — L89152 Pressure ulcer of sacral region, stage 2: Secondary | ICD-10-CM | POA: Diagnosis not present

## 2014-01-29 DIAGNOSIS — F0390 Unspecified dementia without behavioral disturbance: Secondary | ICD-10-CM | POA: Diagnosis not present

## 2014-01-29 DIAGNOSIS — S72141D Displaced intertrochanteric fracture of right femur, subsequent encounter for closed fracture with routine healing: Secondary | ICD-10-CM | POA: Diagnosis not present

## 2014-01-29 DIAGNOSIS — L89152 Pressure ulcer of sacral region, stage 2: Secondary | ICD-10-CM | POA: Diagnosis not present

## 2014-01-29 DIAGNOSIS — R269 Unspecified abnormalities of gait and mobility: Secondary | ICD-10-CM | POA: Diagnosis not present

## 2014-01-29 DIAGNOSIS — I10 Essential (primary) hypertension: Secondary | ICD-10-CM | POA: Diagnosis not present

## 2014-01-30 DIAGNOSIS — F0390 Unspecified dementia without behavioral disturbance: Secondary | ICD-10-CM | POA: Diagnosis not present

## 2014-01-30 DIAGNOSIS — I10 Essential (primary) hypertension: Secondary | ICD-10-CM | POA: Diagnosis not present

## 2014-01-30 DIAGNOSIS — L89152 Pressure ulcer of sacral region, stage 2: Secondary | ICD-10-CM | POA: Diagnosis not present

## 2014-01-30 DIAGNOSIS — S72141D Displaced intertrochanteric fracture of right femur, subsequent encounter for closed fracture with routine healing: Secondary | ICD-10-CM | POA: Diagnosis not present

## 2014-01-30 DIAGNOSIS — R269 Unspecified abnormalities of gait and mobility: Secondary | ICD-10-CM | POA: Diagnosis not present

## 2014-02-03 DIAGNOSIS — R269 Unspecified abnormalities of gait and mobility: Secondary | ICD-10-CM | POA: Diagnosis not present

## 2014-02-03 DIAGNOSIS — L89152 Pressure ulcer of sacral region, stage 2: Secondary | ICD-10-CM | POA: Diagnosis not present

## 2014-02-03 DIAGNOSIS — F0390 Unspecified dementia without behavioral disturbance: Secondary | ICD-10-CM | POA: Diagnosis not present

## 2014-02-03 DIAGNOSIS — I10 Essential (primary) hypertension: Secondary | ICD-10-CM | POA: Diagnosis not present

## 2014-02-03 DIAGNOSIS — S72141D Displaced intertrochanteric fracture of right femur, subsequent encounter for closed fracture with routine healing: Secondary | ICD-10-CM | POA: Diagnosis not present

## 2014-02-05 DIAGNOSIS — L89152 Pressure ulcer of sacral region, stage 2: Secondary | ICD-10-CM | POA: Diagnosis not present

## 2014-02-05 DIAGNOSIS — R269 Unspecified abnormalities of gait and mobility: Secondary | ICD-10-CM | POA: Diagnosis not present

## 2014-02-05 DIAGNOSIS — I10 Essential (primary) hypertension: Secondary | ICD-10-CM | POA: Diagnosis not present

## 2014-02-05 DIAGNOSIS — F0390 Unspecified dementia without behavioral disturbance: Secondary | ICD-10-CM | POA: Diagnosis not present

## 2014-02-05 DIAGNOSIS — S72141D Displaced intertrochanteric fracture of right femur, subsequent encounter for closed fracture with routine healing: Secondary | ICD-10-CM | POA: Diagnosis not present

## 2014-02-10 DIAGNOSIS — M5136 Other intervertebral disc degeneration, lumbar region: Secondary | ICD-10-CM | POA: Diagnosis not present

## 2014-02-10 DIAGNOSIS — I2510 Atherosclerotic heart disease of native coronary artery without angina pectoris: Secondary | ICD-10-CM | POA: Diagnosis not present

## 2014-02-10 DIAGNOSIS — K5730 Diverticulosis of large intestine without perforation or abscess without bleeding: Secondary | ICD-10-CM | POA: Diagnosis not present

## 2014-02-10 DIAGNOSIS — J9811 Atelectasis: Secondary | ICD-10-CM | POA: Diagnosis not present

## 2014-02-10 DIAGNOSIS — R269 Unspecified abnormalities of gait and mobility: Secondary | ICD-10-CM | POA: Diagnosis not present

## 2014-02-10 DIAGNOSIS — K8020 Calculus of gallbladder without cholecystitis without obstruction: Secondary | ICD-10-CM | POA: Diagnosis not present

## 2014-02-10 DIAGNOSIS — F0390 Unspecified dementia without behavioral disturbance: Secondary | ICD-10-CM | POA: Diagnosis not present

## 2014-02-10 DIAGNOSIS — G8929 Other chronic pain: Secondary | ICD-10-CM | POA: Diagnosis not present

## 2014-02-10 DIAGNOSIS — M47898 Other spondylosis, sacral and sacrococcygeal region: Secondary | ICD-10-CM | POA: Diagnosis not present

## 2014-02-10 DIAGNOSIS — M47816 Spondylosis without myelopathy or radiculopathy, lumbar region: Secondary | ICD-10-CM | POA: Diagnosis not present

## 2014-02-10 DIAGNOSIS — K429 Umbilical hernia without obstruction or gangrene: Secondary | ICD-10-CM | POA: Diagnosis not present

## 2014-02-10 DIAGNOSIS — K449 Diaphragmatic hernia without obstruction or gangrene: Secondary | ICD-10-CM | POA: Diagnosis not present

## 2014-02-10 DIAGNOSIS — R102 Pelvic and perineal pain: Secondary | ICD-10-CM | POA: Diagnosis not present

## 2014-02-10 DIAGNOSIS — S72141D Displaced intertrochanteric fracture of right femur, subsequent encounter for closed fracture with routine healing: Secondary | ICD-10-CM | POA: Diagnosis not present

## 2014-02-10 DIAGNOSIS — I10 Essential (primary) hypertension: Secondary | ICD-10-CM | POA: Diagnosis not present

## 2014-02-10 DIAGNOSIS — K828 Other specified diseases of gallbladder: Secondary | ICD-10-CM | POA: Diagnosis not present

## 2014-02-10 DIAGNOSIS — L89152 Pressure ulcer of sacral region, stage 2: Secondary | ICD-10-CM | POA: Diagnosis not present

## 2014-02-10 DIAGNOSIS — D734 Cyst of spleen: Secondary | ICD-10-CM | POA: Diagnosis not present

## 2014-02-10 DIAGNOSIS — J90 Pleural effusion, not elsewhere classified: Secondary | ICD-10-CM | POA: Diagnosis not present

## 2014-02-10 DIAGNOSIS — L0591 Pilonidal cyst without abscess: Secondary | ICD-10-CM | POA: Diagnosis not present

## 2014-02-10 DIAGNOSIS — I714 Abdominal aortic aneurysm, without rupture: Secondary | ICD-10-CM | POA: Diagnosis not present

## 2014-02-10 DIAGNOSIS — M160 Bilateral primary osteoarthritis of hip: Secondary | ICD-10-CM | POA: Diagnosis not present

## 2014-02-10 DIAGNOSIS — N858 Other specified noninflammatory disorders of uterus: Secondary | ICD-10-CM | POA: Diagnosis not present

## 2014-02-10 DIAGNOSIS — N281 Cyst of kidney, acquired: Secondary | ICD-10-CM | POA: Diagnosis not present

## 2014-02-10 DIAGNOSIS — D1803 Hemangioma of intra-abdominal structures: Secondary | ICD-10-CM | POA: Diagnosis not present

## 2014-02-10 DIAGNOSIS — K4090 Unilateral inguinal hernia, without obstruction or gangrene, not specified as recurrent: Secondary | ICD-10-CM | POA: Diagnosis not present

## 2014-02-10 DIAGNOSIS — M4806 Spinal stenosis, lumbar region: Secondary | ICD-10-CM | POA: Diagnosis not present

## 2014-02-13 DIAGNOSIS — H6123 Impacted cerumen, bilateral: Secondary | ICD-10-CM | POA: Diagnosis not present

## 2014-02-14 DIAGNOSIS — S72141D Displaced intertrochanteric fracture of right femur, subsequent encounter for closed fracture with routine healing: Secondary | ICD-10-CM | POA: Diagnosis not present

## 2014-02-14 DIAGNOSIS — L89152 Pressure ulcer of sacral region, stage 2: Secondary | ICD-10-CM | POA: Diagnosis not present

## 2014-02-14 DIAGNOSIS — F0390 Unspecified dementia without behavioral disturbance: Secondary | ICD-10-CM | POA: Diagnosis not present

## 2014-02-14 DIAGNOSIS — R269 Unspecified abnormalities of gait and mobility: Secondary | ICD-10-CM | POA: Diagnosis not present

## 2014-02-14 DIAGNOSIS — I10 Essential (primary) hypertension: Secondary | ICD-10-CM | POA: Diagnosis not present

## 2014-02-17 DIAGNOSIS — L89152 Pressure ulcer of sacral region, stage 2: Secondary | ICD-10-CM | POA: Diagnosis not present

## 2014-02-17 DIAGNOSIS — S72141D Displaced intertrochanteric fracture of right femur, subsequent encounter for closed fracture with routine healing: Secondary | ICD-10-CM | POA: Diagnosis not present

## 2014-02-17 DIAGNOSIS — F0390 Unspecified dementia without behavioral disturbance: Secondary | ICD-10-CM | POA: Diagnosis not present

## 2014-02-17 DIAGNOSIS — I10 Essential (primary) hypertension: Secondary | ICD-10-CM | POA: Diagnosis not present

## 2014-02-17 DIAGNOSIS — R269 Unspecified abnormalities of gait and mobility: Secondary | ICD-10-CM | POA: Diagnosis not present

## 2014-02-19 DIAGNOSIS — N390 Urinary tract infection, site not specified: Secondary | ICD-10-CM | POA: Diagnosis not present

## 2014-02-19 DIAGNOSIS — I10 Essential (primary) hypertension: Secondary | ICD-10-CM | POA: Diagnosis not present

## 2014-02-19 DIAGNOSIS — S72141D Displaced intertrochanteric fracture of right femur, subsequent encounter for closed fracture with routine healing: Secondary | ICD-10-CM | POA: Diagnosis not present

## 2014-02-19 DIAGNOSIS — R269 Unspecified abnormalities of gait and mobility: Secondary | ICD-10-CM | POA: Diagnosis not present

## 2014-02-19 DIAGNOSIS — F0390 Unspecified dementia without behavioral disturbance: Secondary | ICD-10-CM | POA: Diagnosis not present

## 2014-02-19 DIAGNOSIS — L89152 Pressure ulcer of sacral region, stage 2: Secondary | ICD-10-CM | POA: Diagnosis not present

## 2014-02-21 DIAGNOSIS — L89152 Pressure ulcer of sacral region, stage 2: Secondary | ICD-10-CM | POA: Diagnosis not present

## 2014-02-21 DIAGNOSIS — F0390 Unspecified dementia without behavioral disturbance: Secondary | ICD-10-CM | POA: Diagnosis not present

## 2014-02-21 DIAGNOSIS — I10 Essential (primary) hypertension: Secondary | ICD-10-CM | POA: Diagnosis not present

## 2014-02-21 DIAGNOSIS — S72141D Displaced intertrochanteric fracture of right femur, subsequent encounter for closed fracture with routine healing: Secondary | ICD-10-CM | POA: Diagnosis not present

## 2014-02-21 DIAGNOSIS — R269 Unspecified abnormalities of gait and mobility: Secondary | ICD-10-CM | POA: Diagnosis not present

## 2014-02-24 DIAGNOSIS — L89152 Pressure ulcer of sacral region, stage 2: Secondary | ICD-10-CM | POA: Diagnosis not present

## 2014-02-24 DIAGNOSIS — S72141D Displaced intertrochanteric fracture of right femur, subsequent encounter for closed fracture with routine healing: Secondary | ICD-10-CM | POA: Diagnosis not present

## 2014-02-24 DIAGNOSIS — F0390 Unspecified dementia without behavioral disturbance: Secondary | ICD-10-CM | POA: Diagnosis not present

## 2014-02-24 DIAGNOSIS — R269 Unspecified abnormalities of gait and mobility: Secondary | ICD-10-CM | POA: Diagnosis not present

## 2014-02-24 DIAGNOSIS — I10 Essential (primary) hypertension: Secondary | ICD-10-CM | POA: Diagnosis not present

## 2014-02-25 DIAGNOSIS — F0390 Unspecified dementia without behavioral disturbance: Secondary | ICD-10-CM | POA: Diagnosis not present

## 2014-02-25 DIAGNOSIS — I714 Abdominal aortic aneurysm, without rupture: Secondary | ICD-10-CM | POA: Diagnosis not present

## 2014-02-25 DIAGNOSIS — S72141D Displaced intertrochanteric fracture of right femur, subsequent encounter for closed fracture with routine healing: Secondary | ICD-10-CM | POA: Diagnosis not present

## 2014-02-25 DIAGNOSIS — R269 Unspecified abnormalities of gait and mobility: Secondary | ICD-10-CM | POA: Diagnosis not present

## 2014-02-25 DIAGNOSIS — I10 Essential (primary) hypertension: Secondary | ICD-10-CM | POA: Diagnosis not present

## 2014-02-28 DIAGNOSIS — I10 Essential (primary) hypertension: Secondary | ICD-10-CM | POA: Diagnosis not present

## 2014-02-28 DIAGNOSIS — S72141D Displaced intertrochanteric fracture of right femur, subsequent encounter for closed fracture with routine healing: Secondary | ICD-10-CM | POA: Diagnosis not present

## 2014-02-28 DIAGNOSIS — R269 Unspecified abnormalities of gait and mobility: Secondary | ICD-10-CM | POA: Diagnosis not present

## 2014-02-28 DIAGNOSIS — I714 Abdominal aortic aneurysm, without rupture: Secondary | ICD-10-CM | POA: Diagnosis not present

## 2014-02-28 DIAGNOSIS — F0390 Unspecified dementia without behavioral disturbance: Secondary | ICD-10-CM | POA: Diagnosis not present

## 2014-03-04 DIAGNOSIS — I714 Abdominal aortic aneurysm, without rupture: Secondary | ICD-10-CM | POA: Diagnosis not present

## 2014-03-04 DIAGNOSIS — I10 Essential (primary) hypertension: Secondary | ICD-10-CM | POA: Diagnosis not present

## 2014-03-04 DIAGNOSIS — S72141D Displaced intertrochanteric fracture of right femur, subsequent encounter for closed fracture with routine healing: Secondary | ICD-10-CM | POA: Diagnosis not present

## 2014-03-04 DIAGNOSIS — F0390 Unspecified dementia without behavioral disturbance: Secondary | ICD-10-CM | POA: Diagnosis not present

## 2014-03-04 DIAGNOSIS — R269 Unspecified abnormalities of gait and mobility: Secondary | ICD-10-CM | POA: Diagnosis not present

## 2014-03-05 DIAGNOSIS — I714 Abdominal aortic aneurysm, without rupture: Secondary | ICD-10-CM | POA: Diagnosis not present

## 2014-03-05 DIAGNOSIS — B351 Tinea unguium: Secondary | ICD-10-CM | POA: Diagnosis not present

## 2014-03-05 DIAGNOSIS — H9190 Unspecified hearing loss, unspecified ear: Secondary | ICD-10-CM | POA: Diagnosis not present

## 2014-03-05 DIAGNOSIS — G309 Alzheimer's disease, unspecified: Secondary | ICD-10-CM | POA: Diagnosis not present

## 2014-03-05 DIAGNOSIS — I10 Essential (primary) hypertension: Secondary | ICD-10-CM | POA: Diagnosis not present

## 2014-03-06 DIAGNOSIS — F0390 Unspecified dementia without behavioral disturbance: Secondary | ICD-10-CM | POA: Diagnosis not present

## 2014-03-06 DIAGNOSIS — H40023 Open angle with borderline findings, high risk, bilateral: Secondary | ICD-10-CM | POA: Diagnosis not present

## 2014-03-06 DIAGNOSIS — I714 Abdominal aortic aneurysm, without rupture: Secondary | ICD-10-CM | POA: Diagnosis not present

## 2014-03-06 DIAGNOSIS — H5203 Hypermetropia, bilateral: Secondary | ICD-10-CM | POA: Diagnosis not present

## 2014-03-06 DIAGNOSIS — H25813 Combined forms of age-related cataract, bilateral: Secondary | ICD-10-CM | POA: Diagnosis not present

## 2014-03-06 DIAGNOSIS — R269 Unspecified abnormalities of gait and mobility: Secondary | ICD-10-CM | POA: Diagnosis not present

## 2014-03-06 DIAGNOSIS — H47233 Glaucomatous optic atrophy, bilateral: Secondary | ICD-10-CM | POA: Diagnosis not present

## 2014-03-06 DIAGNOSIS — S72141D Displaced intertrochanteric fracture of right femur, subsequent encounter for closed fracture with routine healing: Secondary | ICD-10-CM | POA: Diagnosis not present

## 2014-03-06 DIAGNOSIS — I10 Essential (primary) hypertension: Secondary | ICD-10-CM | POA: Diagnosis not present

## 2014-03-10 DIAGNOSIS — F0390 Unspecified dementia without behavioral disturbance: Secondary | ICD-10-CM | POA: Diagnosis not present

## 2014-03-10 DIAGNOSIS — S72141D Displaced intertrochanteric fracture of right femur, subsequent encounter for closed fracture with routine healing: Secondary | ICD-10-CM | POA: Diagnosis not present

## 2014-03-10 DIAGNOSIS — I10 Essential (primary) hypertension: Secondary | ICD-10-CM | POA: Diagnosis not present

## 2014-03-10 DIAGNOSIS — I714 Abdominal aortic aneurysm, without rupture: Secondary | ICD-10-CM | POA: Diagnosis not present

## 2014-03-10 DIAGNOSIS — D539 Nutritional anemia, unspecified: Secondary | ICD-10-CM | POA: Diagnosis not present

## 2014-03-10 DIAGNOSIS — D649 Anemia, unspecified: Secondary | ICD-10-CM | POA: Diagnosis not present

## 2014-03-10 DIAGNOSIS — M4806 Spinal stenosis, lumbar region: Secondary | ICD-10-CM | POA: Diagnosis not present

## 2014-03-10 DIAGNOSIS — F419 Anxiety disorder, unspecified: Secondary | ICD-10-CM | POA: Diagnosis not present

## 2014-03-10 DIAGNOSIS — R269 Unspecified abnormalities of gait and mobility: Secondary | ICD-10-CM | POA: Diagnosis not present

## 2014-03-11 DIAGNOSIS — I714 Abdominal aortic aneurysm, without rupture: Secondary | ICD-10-CM | POA: Diagnosis not present

## 2014-03-11 DIAGNOSIS — I10 Essential (primary) hypertension: Secondary | ICD-10-CM | POA: Diagnosis not present

## 2014-03-11 DIAGNOSIS — S72141D Displaced intertrochanteric fracture of right femur, subsequent encounter for closed fracture with routine healing: Secondary | ICD-10-CM | POA: Diagnosis not present

## 2014-03-11 DIAGNOSIS — F0390 Unspecified dementia without behavioral disturbance: Secondary | ICD-10-CM | POA: Diagnosis not present

## 2014-03-11 DIAGNOSIS — K8020 Calculus of gallbladder without cholecystitis without obstruction: Secondary | ICD-10-CM | POA: Diagnosis not present

## 2014-03-11 DIAGNOSIS — R269 Unspecified abnormalities of gait and mobility: Secondary | ICD-10-CM | POA: Diagnosis not present

## 2014-03-12 DIAGNOSIS — R269 Unspecified abnormalities of gait and mobility: Secondary | ICD-10-CM | POA: Diagnosis not present

## 2014-03-12 DIAGNOSIS — I10 Essential (primary) hypertension: Secondary | ICD-10-CM | POA: Diagnosis not present

## 2014-03-12 DIAGNOSIS — S72141D Displaced intertrochanteric fracture of right femur, subsequent encounter for closed fracture with routine healing: Secondary | ICD-10-CM | POA: Diagnosis not present

## 2014-03-12 DIAGNOSIS — F0390 Unspecified dementia without behavioral disturbance: Secondary | ICD-10-CM | POA: Diagnosis not present

## 2014-03-12 DIAGNOSIS — I714 Abdominal aortic aneurysm, without rupture: Secondary | ICD-10-CM | POA: Diagnosis not present

## 2014-03-17 DIAGNOSIS — I714 Abdominal aortic aneurysm, without rupture: Secondary | ICD-10-CM | POA: Diagnosis not present

## 2014-03-17 DIAGNOSIS — F0390 Unspecified dementia without behavioral disturbance: Secondary | ICD-10-CM | POA: Diagnosis not present

## 2014-03-17 DIAGNOSIS — S72141D Displaced intertrochanteric fracture of right femur, subsequent encounter for closed fracture with routine healing: Secondary | ICD-10-CM | POA: Diagnosis not present

## 2014-03-17 DIAGNOSIS — I10 Essential (primary) hypertension: Secondary | ICD-10-CM | POA: Diagnosis not present

## 2014-03-17 DIAGNOSIS — R269 Unspecified abnormalities of gait and mobility: Secondary | ICD-10-CM | POA: Diagnosis not present

## 2014-03-18 DIAGNOSIS — I10 Essential (primary) hypertension: Secondary | ICD-10-CM | POA: Diagnosis not present

## 2014-03-18 DIAGNOSIS — S72141D Displaced intertrochanteric fracture of right femur, subsequent encounter for closed fracture with routine healing: Secondary | ICD-10-CM | POA: Diagnosis not present

## 2014-03-18 DIAGNOSIS — F0390 Unspecified dementia without behavioral disturbance: Secondary | ICD-10-CM | POA: Diagnosis not present

## 2014-03-18 DIAGNOSIS — R269 Unspecified abnormalities of gait and mobility: Secondary | ICD-10-CM | POA: Diagnosis not present

## 2014-03-18 DIAGNOSIS — I714 Abdominal aortic aneurysm, without rupture: Secondary | ICD-10-CM | POA: Diagnosis not present

## 2014-03-24 DIAGNOSIS — I714 Abdominal aortic aneurysm, without rupture: Secondary | ICD-10-CM | POA: Diagnosis not present

## 2014-03-24 DIAGNOSIS — I10 Essential (primary) hypertension: Secondary | ICD-10-CM | POA: Diagnosis not present

## 2014-03-24 DIAGNOSIS — G309 Alzheimer's disease, unspecified: Secondary | ICD-10-CM | POA: Diagnosis not present

## 2014-03-27 DIAGNOSIS — S72141D Displaced intertrochanteric fracture of right femur, subsequent encounter for closed fracture with routine healing: Secondary | ICD-10-CM | POA: Diagnosis not present

## 2014-03-27 DIAGNOSIS — F0390 Unspecified dementia without behavioral disturbance: Secondary | ICD-10-CM | POA: Diagnosis not present

## 2014-03-27 DIAGNOSIS — I10 Essential (primary) hypertension: Secondary | ICD-10-CM | POA: Diagnosis not present

## 2014-03-27 DIAGNOSIS — I714 Abdominal aortic aneurysm, without rupture: Secondary | ICD-10-CM | POA: Diagnosis not present

## 2014-03-27 DIAGNOSIS — R269 Unspecified abnormalities of gait and mobility: Secondary | ICD-10-CM | POA: Diagnosis not present

## 2014-04-04 DIAGNOSIS — H3530 Unspecified macular degeneration: Secondary | ICD-10-CM | POA: Diagnosis not present

## 2014-04-04 DIAGNOSIS — H2513 Age-related nuclear cataract, bilateral: Secondary | ICD-10-CM | POA: Diagnosis not present

## 2014-04-09 DIAGNOSIS — S72143D Displaced intertrochanteric fracture of unspecified femur, subsequent encounter for closed fracture with routine healing: Secondary | ICD-10-CM | POA: Diagnosis not present

## 2014-04-11 DIAGNOSIS — F0390 Unspecified dementia without behavioral disturbance: Secondary | ICD-10-CM | POA: Diagnosis not present

## 2014-04-11 DIAGNOSIS — I10 Essential (primary) hypertension: Secondary | ICD-10-CM | POA: Diagnosis not present

## 2014-04-11 DIAGNOSIS — I714 Abdominal aortic aneurysm, without rupture: Secondary | ICD-10-CM | POA: Diagnosis not present

## 2014-04-11 DIAGNOSIS — S72141D Displaced intertrochanteric fracture of right femur, subsequent encounter for closed fracture with routine healing: Secondary | ICD-10-CM | POA: Diagnosis not present

## 2014-04-11 DIAGNOSIS — R269 Unspecified abnormalities of gait and mobility: Secondary | ICD-10-CM | POA: Diagnosis not present

## 2014-04-17 DIAGNOSIS — I714 Abdominal aortic aneurysm, without rupture: Secondary | ICD-10-CM | POA: Diagnosis not present

## 2014-04-17 DIAGNOSIS — R269 Unspecified abnormalities of gait and mobility: Secondary | ICD-10-CM | POA: Diagnosis not present

## 2014-04-17 DIAGNOSIS — F0390 Unspecified dementia without behavioral disturbance: Secondary | ICD-10-CM | POA: Diagnosis not present

## 2014-04-17 DIAGNOSIS — S72141D Displaced intertrochanteric fracture of right femur, subsequent encounter for closed fracture with routine healing: Secondary | ICD-10-CM | POA: Diagnosis not present

## 2014-04-17 DIAGNOSIS — I10 Essential (primary) hypertension: Secondary | ICD-10-CM | POA: Diagnosis not present

## 2014-05-12 DIAGNOSIS — E559 Vitamin D deficiency, unspecified: Secondary | ICD-10-CM | POA: Diagnosis not present

## 2014-05-12 DIAGNOSIS — E039 Hypothyroidism, unspecified: Secondary | ICD-10-CM | POA: Diagnosis not present

## 2014-05-12 DIAGNOSIS — I10 Essential (primary) hypertension: Secondary | ICD-10-CM | POA: Diagnosis not present

## 2014-05-12 DIAGNOSIS — Z79899 Other long term (current) drug therapy: Secondary | ICD-10-CM | POA: Diagnosis not present

## 2014-05-12 DIAGNOSIS — F0390 Unspecified dementia without behavioral disturbance: Secondary | ICD-10-CM | POA: Diagnosis not present

## 2014-05-15 DIAGNOSIS — J309 Allergic rhinitis, unspecified: Secondary | ICD-10-CM | POA: Diagnosis not present

## 2014-05-15 DIAGNOSIS — L309 Dermatitis, unspecified: Secondary | ICD-10-CM | POA: Diagnosis not present

## 2014-05-15 DIAGNOSIS — F0390 Unspecified dementia without behavioral disturbance: Secondary | ICD-10-CM | POA: Diagnosis not present

## 2014-05-16 DIAGNOSIS — R3981 Functional urinary incontinence: Secondary | ICD-10-CM | POA: Diagnosis not present

## 2014-05-16 DIAGNOSIS — N390 Urinary tract infection, site not specified: Secondary | ICD-10-CM | POA: Diagnosis not present

## 2014-05-23 DIAGNOSIS — S91109A Unspecified open wound of unspecified toe(s) without damage to nail, initial encounter: Secondary | ICD-10-CM | POA: Diagnosis not present

## 2014-05-23 DIAGNOSIS — L97519 Non-pressure chronic ulcer of other part of right foot with unspecified severity: Secondary | ICD-10-CM | POA: Diagnosis not present

## 2014-05-28 DIAGNOSIS — L97519 Non-pressure chronic ulcer of other part of right foot with unspecified severity: Secondary | ICD-10-CM | POA: Diagnosis not present

## 2014-05-28 DIAGNOSIS — M25571 Pain in right ankle and joints of right foot: Secondary | ICD-10-CM | POA: Diagnosis not present

## 2014-06-07 DIAGNOSIS — R197 Diarrhea, unspecified: Secondary | ICD-10-CM | POA: Diagnosis not present

## 2014-06-10 DIAGNOSIS — R197 Diarrhea, unspecified: Secondary | ICD-10-CM | POA: Diagnosis not present

## 2014-06-10 DIAGNOSIS — I10 Essential (primary) hypertension: Secondary | ICD-10-CM | POA: Diagnosis not present

## 2014-06-10 DIAGNOSIS — F0390 Unspecified dementia without behavioral disturbance: Secondary | ICD-10-CM | POA: Diagnosis not present

## 2014-06-10 DIAGNOSIS — E8809 Other disorders of plasma-protein metabolism, not elsewhere classified: Secondary | ICD-10-CM | POA: Diagnosis not present

## 2014-06-11 DIAGNOSIS — R197 Diarrhea, unspecified: Secondary | ICD-10-CM | POA: Diagnosis not present

## 2014-06-14 DIAGNOSIS — R197 Diarrhea, unspecified: Secondary | ICD-10-CM | POA: Diagnosis not present

## 2014-06-18 DIAGNOSIS — K5900 Constipation, unspecified: Secondary | ICD-10-CM | POA: Diagnosis not present

## 2014-06-18 DIAGNOSIS — L89322 Pressure ulcer of left buttock, stage 2: Secondary | ICD-10-CM | POA: Diagnosis not present

## 2014-06-18 DIAGNOSIS — R197 Diarrhea, unspecified: Secondary | ICD-10-CM | POA: Diagnosis not present

## 2014-06-18 DIAGNOSIS — G309 Alzheimer's disease, unspecified: Secondary | ICD-10-CM | POA: Diagnosis not present

## 2014-06-18 DIAGNOSIS — K648 Other hemorrhoids: Secondary | ICD-10-CM | POA: Diagnosis not present

## 2014-06-21 DIAGNOSIS — K8020 Calculus of gallbladder without cholecystitis without obstruction: Secondary | ICD-10-CM | POA: Diagnosis not present

## 2014-06-21 DIAGNOSIS — Z79899 Other long term (current) drug therapy: Secondary | ICD-10-CM | POA: Diagnosis not present

## 2014-06-21 DIAGNOSIS — F0390 Unspecified dementia without behavioral disturbance: Secondary | ICD-10-CM | POA: Diagnosis not present

## 2014-06-21 DIAGNOSIS — E876 Hypokalemia: Secondary | ICD-10-CM | POA: Diagnosis not present

## 2014-06-21 DIAGNOSIS — K5150 Left sided colitis without complications: Secondary | ICD-10-CM | POA: Diagnosis not present

## 2014-06-21 DIAGNOSIS — R109 Unspecified abdominal pain: Secondary | ICD-10-CM | POA: Diagnosis not present

## 2014-06-21 DIAGNOSIS — A09 Infectious gastroenteritis and colitis, unspecified: Secondary | ICD-10-CM | POA: Diagnosis not present

## 2014-06-21 DIAGNOSIS — Z880 Allergy status to penicillin: Secondary | ICD-10-CM | POA: Diagnosis not present

## 2014-06-21 DIAGNOSIS — I10 Essential (primary) hypertension: Secondary | ICD-10-CM | POA: Diagnosis not present

## 2014-06-21 DIAGNOSIS — I714 Abdominal aortic aneurysm, without rupture: Secondary | ICD-10-CM | POA: Diagnosis not present

## 2014-06-21 DIAGNOSIS — R197 Diarrhea, unspecified: Secondary | ICD-10-CM | POA: Diagnosis not present

## 2014-06-21 DIAGNOSIS — B9620 Unspecified Escherichia coli [E. coli] as the cause of diseases classified elsewhere: Secondary | ICD-10-CM | POA: Diagnosis not present

## 2014-06-21 DIAGNOSIS — R05 Cough: Secondary | ICD-10-CM | POA: Diagnosis not present

## 2014-06-21 DIAGNOSIS — M1712 Unilateral primary osteoarthritis, left knee: Secondary | ICD-10-CM | POA: Diagnosis present

## 2014-06-21 DIAGNOSIS — N390 Urinary tract infection, site not specified: Secondary | ICD-10-CM | POA: Diagnosis not present

## 2014-06-21 DIAGNOSIS — E039 Hypothyroidism, unspecified: Secondary | ICD-10-CM | POA: Diagnosis present

## 2014-06-25 DIAGNOSIS — R197 Diarrhea, unspecified: Secondary | ICD-10-CM | POA: Diagnosis not present

## 2014-06-30 DIAGNOSIS — G309 Alzheimer's disease, unspecified: Secondary | ICD-10-CM | POA: Diagnosis not present

## 2014-06-30 DIAGNOSIS — L89322 Pressure ulcer of left buttock, stage 2: Secondary | ICD-10-CM | POA: Diagnosis not present

## 2014-07-03 DIAGNOSIS — L89322 Pressure ulcer of left buttock, stage 2: Secondary | ICD-10-CM | POA: Diagnosis not present

## 2014-07-03 DIAGNOSIS — G309 Alzheimer's disease, unspecified: Secondary | ICD-10-CM | POA: Diagnosis not present

## 2014-07-07 DIAGNOSIS — G309 Alzheimer's disease, unspecified: Secondary | ICD-10-CM | POA: Diagnosis not present

## 2014-07-07 DIAGNOSIS — L89322 Pressure ulcer of left buttock, stage 2: Secondary | ICD-10-CM | POA: Diagnosis not present

## 2014-07-08 DIAGNOSIS — L89322 Pressure ulcer of left buttock, stage 2: Secondary | ICD-10-CM | POA: Diagnosis not present

## 2014-07-08 DIAGNOSIS — G309 Alzheimer's disease, unspecified: Secondary | ICD-10-CM | POA: Diagnosis not present

## 2014-07-10 DIAGNOSIS — G309 Alzheimer's disease, unspecified: Secondary | ICD-10-CM | POA: Diagnosis not present

## 2014-07-10 DIAGNOSIS — H2513 Age-related nuclear cataract, bilateral: Secondary | ICD-10-CM | POA: Diagnosis not present

## 2014-07-10 DIAGNOSIS — H3530 Unspecified macular degeneration: Secondary | ICD-10-CM | POA: Diagnosis not present

## 2014-07-10 DIAGNOSIS — L89322 Pressure ulcer of left buttock, stage 2: Secondary | ICD-10-CM | POA: Diagnosis not present

## 2014-07-15 DIAGNOSIS — G309 Alzheimer's disease, unspecified: Secondary | ICD-10-CM | POA: Diagnosis not present

## 2014-07-15 DIAGNOSIS — L89322 Pressure ulcer of left buttock, stage 2: Secondary | ICD-10-CM | POA: Diagnosis not present

## 2014-07-17 DIAGNOSIS — G309 Alzheimer's disease, unspecified: Secondary | ICD-10-CM | POA: Diagnosis not present

## 2014-07-17 DIAGNOSIS — L89322 Pressure ulcer of left buttock, stage 2: Secondary | ICD-10-CM | POA: Diagnosis not present

## 2014-07-22 DIAGNOSIS — G309 Alzheimer's disease, unspecified: Secondary | ICD-10-CM | POA: Diagnosis not present

## 2014-07-22 DIAGNOSIS — L89322 Pressure ulcer of left buttock, stage 2: Secondary | ICD-10-CM | POA: Diagnosis not present

## 2014-07-24 DIAGNOSIS — L89322 Pressure ulcer of left buttock, stage 2: Secondary | ICD-10-CM | POA: Diagnosis not present

## 2014-07-24 DIAGNOSIS — G309 Alzheimer's disease, unspecified: Secondary | ICD-10-CM | POA: Diagnosis not present

## 2014-07-28 DIAGNOSIS — L89322 Pressure ulcer of left buttock, stage 2: Secondary | ICD-10-CM | POA: Diagnosis not present

## 2014-07-28 DIAGNOSIS — G309 Alzheimer's disease, unspecified: Secondary | ICD-10-CM | POA: Diagnosis not present

## 2014-07-29 DIAGNOSIS — L89322 Pressure ulcer of left buttock, stage 2: Secondary | ICD-10-CM | POA: Diagnosis not present

## 2014-07-29 DIAGNOSIS — G309 Alzheimer's disease, unspecified: Secondary | ICD-10-CM | POA: Diagnosis not present

## 2014-07-31 DIAGNOSIS — G309 Alzheimer's disease, unspecified: Secondary | ICD-10-CM | POA: Diagnosis not present

## 2014-07-31 DIAGNOSIS — L89322 Pressure ulcer of left buttock, stage 2: Secondary | ICD-10-CM | POA: Diagnosis not present

## 2014-08-07 DIAGNOSIS — G309 Alzheimer's disease, unspecified: Secondary | ICD-10-CM | POA: Diagnosis not present

## 2014-08-07 DIAGNOSIS — L89322 Pressure ulcer of left buttock, stage 2: Secondary | ICD-10-CM | POA: Diagnosis not present

## 2014-08-13 DIAGNOSIS — L89322 Pressure ulcer of left buttock, stage 2: Secondary | ICD-10-CM | POA: Diagnosis not present

## 2014-08-13 DIAGNOSIS — G309 Alzheimer's disease, unspecified: Secondary | ICD-10-CM | POA: Diagnosis not present

## 2014-08-15 DIAGNOSIS — G309 Alzheimer's disease, unspecified: Secondary | ICD-10-CM | POA: Diagnosis not present

## 2014-08-15 DIAGNOSIS — L89322 Pressure ulcer of left buttock, stage 2: Secondary | ICD-10-CM | POA: Diagnosis not present

## 2014-08-20 DIAGNOSIS — M1611 Unilateral primary osteoarthritis, right hip: Secondary | ICD-10-CM | POA: Diagnosis not present

## 2014-08-20 DIAGNOSIS — S72143D Displaced intertrochanteric fracture of unspecified femur, subsequent encounter for closed fracture with routine healing: Secondary | ICD-10-CM | POA: Diagnosis not present

## 2014-09-26 ENCOUNTER — Emergency Department (HOSPITAL_BASED_OUTPATIENT_CLINIC_OR_DEPARTMENT_OTHER): Payer: Medicare Other

## 2014-09-26 ENCOUNTER — Encounter (HOSPITAL_BASED_OUTPATIENT_CLINIC_OR_DEPARTMENT_OTHER): Payer: Self-pay | Admitting: *Deleted

## 2014-09-26 ENCOUNTER — Emergency Department (HOSPITAL_BASED_OUTPATIENT_CLINIC_OR_DEPARTMENT_OTHER)
Admission: EM | Admit: 2014-09-26 | Discharge: 2014-09-26 | Disposition: A | Discharge status: Home / Self Care | Payer: Medicare Other | Attending: Emergency Medicine | Admitting: Emergency Medicine

## 2014-09-26 DIAGNOSIS — S60511A Abrasion of right hand, initial encounter: Secondary | ICD-10-CM | POA: Insufficient documentation

## 2014-09-26 DIAGNOSIS — N390 Urinary tract infection, site not specified: Secondary | ICD-10-CM | POA: Diagnosis not present

## 2014-09-26 DIAGNOSIS — R197 Diarrhea, unspecified: Secondary | ICD-10-CM | POA: Insufficient documentation

## 2014-09-26 DIAGNOSIS — M545 Low back pain, unspecified: Secondary | ICD-10-CM

## 2014-09-26 DIAGNOSIS — W19XXXA Unspecified fall, initial encounter: Secondary | ICD-10-CM

## 2014-09-26 DIAGNOSIS — S3992XA Unspecified injury of lower back, initial encounter: Secondary | ICD-10-CM | POA: Insufficient documentation

## 2014-09-26 DIAGNOSIS — I10 Essential (primary) hypertension: Secondary | ICD-10-CM | POA: Insufficient documentation

## 2014-09-26 DIAGNOSIS — Z791 Long term (current) use of non-steroidal anti-inflammatories (NSAID): Secondary | ICD-10-CM | POA: Diagnosis not present

## 2014-09-26 DIAGNOSIS — Y998 Other external cause status: Secondary | ICD-10-CM | POA: Diagnosis not present

## 2014-09-26 DIAGNOSIS — Y9289 Other specified places as the place of occurrence of the external cause: Secondary | ICD-10-CM | POA: Diagnosis not present

## 2014-09-26 DIAGNOSIS — S3993XA Unspecified injury of pelvis, initial encounter: Secondary | ICD-10-CM | POA: Diagnosis not present

## 2014-09-26 DIAGNOSIS — Z79899 Other long term (current) drug therapy: Secondary | ICD-10-CM | POA: Insufficient documentation

## 2014-09-26 DIAGNOSIS — Z880 Allergy status to penicillin: Secondary | ICD-10-CM | POA: Diagnosis not present

## 2014-09-26 DIAGNOSIS — E079 Disorder of thyroid, unspecified: Secondary | ICD-10-CM | POA: Insufficient documentation

## 2014-09-26 DIAGNOSIS — W1839XA Other fall on same level, initial encounter: Secondary | ICD-10-CM | POA: Diagnosis not present

## 2014-09-26 DIAGNOSIS — F0390 Unspecified dementia without behavioral disturbance: Secondary | ICD-10-CM | POA: Diagnosis not present

## 2014-09-26 DIAGNOSIS — R109 Unspecified abdominal pain: Secondary | ICD-10-CM | POA: Diagnosis not present

## 2014-09-26 DIAGNOSIS — M549 Dorsalgia, unspecified: Secondary | ICD-10-CM | POA: Diagnosis not present

## 2014-09-26 DIAGNOSIS — Y9389 Activity, other specified: Secondary | ICD-10-CM | POA: Insufficient documentation

## 2014-09-26 DIAGNOSIS — S3991XA Unspecified injury of abdomen, initial encounter: Secondary | ICD-10-CM | POA: Insufficient documentation

## 2014-09-26 HISTORY — DX: Unspecified dementia, unspecified severity, without behavioral disturbance, psychotic disturbance, mood disturbance, and anxiety: F03.90

## 2014-09-26 HISTORY — DX: Abdominal aortic aneurysm, without rupture: I71.4

## 2014-09-26 HISTORY — DX: Abdominal aortic aneurysm, without rupture, unspecified: I71.40

## 2014-09-26 LAB — COMPREHENSIVE METABOLIC PANEL
ALK PHOS: 78 U/L (ref 38–126)
ALT: 17 U/L (ref 14–54)
AST: 21 U/L (ref 15–41)
Albumin: 3.5 g/dL (ref 3.5–5.0)
Anion gap: 6 (ref 5–15)
BUN: 18 mg/dL (ref 6–20)
CHLORIDE: 100 mmol/L — AB (ref 101–111)
CO2: 30 mmol/L (ref 22–32)
CREATININE: 0.84 mg/dL (ref 0.44–1.00)
Calcium: 9.8 mg/dL (ref 8.9–10.3)
GFR calc Af Amer: >60 mL/min (ref >60)
GFR, EST NON AFRICAN AMERICAN: 57 mL/min — AB (ref >60)
Glucose, Bld: 116 mg/dL — ABNORMAL HIGH (ref 65–99)
Potassium: 4.4 mmol/L (ref 3.5–5.1)
Sodium: 136 mmol/L (ref 135–145)
TOTAL PROTEIN: 6.3 g/dL — AB (ref 6.5–8.1)
Total Bilirubin: 0.5 mg/dL (ref 0.3–1.2)

## 2014-09-26 LAB — CBC WITH DIFFERENTIAL/PLATELET
BASOS ABS: 0 10*3/uL (ref 0.0–0.1)
BASOS PCT: 0 % (ref 0–1)
Band Neutrophils: 2 % (ref 0–10)
EOS ABS: 0.2 10*3/uL (ref 0.0–0.7)
EOS PCT: 2 % (ref 0–5)
HEMATOCRIT: 45.6 % (ref 36.0–46.0)
Hemoglobin: 15 g/dL (ref 12.0–15.0)
Lymphocytes Relative: 8 % — ABNORMAL LOW (ref 12–46)
Lymphs Abs: 0.7 10*3/uL (ref 0.7–4.0)
MCH: 30.2 pg (ref 26.0–34.0)
MCHC: 32.9 g/dL (ref 30.0–36.0)
MCV: 91.8 fL (ref 78.0–100.0)
MONOS PCT: 10 % (ref 3–12)
Monocytes Absolute: 0.9 10*3/uL (ref 0.1–1.0)
NEUTROS ABS: 7.3 10*3/uL (ref 1.7–7.7)
Neutrophils Relative %: 78 % — ABNORMAL HIGH (ref 43–77)
Platelets: 208 10*3/uL (ref 150–400)
RBC: 4.97 MIL/uL (ref 3.87–5.11)
RDW: 15.1 % (ref 11.5–15.5)
WBC: 9.1 10*3/uL (ref 4.0–10.5)

## 2014-09-26 LAB — URINALYSIS, ROUTINE W REFLEX MICROSCOPIC
Bilirubin Urine: NEGATIVE
Glucose, UA: NEGATIVE mg/dL
KETONES UR: NEGATIVE mg/dL
NITRITE: NEGATIVE
Protein, ur: NEGATIVE mg/dL
Specific Gravity, Urine: 1.015 (ref 1.005–1.030)
Urobilinogen, UA: 0.2 mg/dL (ref 0.0–1.0)
pH: 7.5 (ref 5.0–8.0)

## 2014-09-26 LAB — URINE MICROSCOPIC-ADD ON

## 2014-09-26 MED ORDER — CEPHALEXIN 500 MG PO CAPS: 500.0000 mg | ORAL_CAPSULE | Freq: Two times a day (BID) | ORAL | Status: DC

## 2014-09-26 MED ORDER — CEPHALEXIN 250 MG PO CAPS
500.0000 mg | ORAL_CAPSULE | Freq: Once | ORAL | Status: AC
  Administered 2014-09-26: 500 mg via ORAL
  Filled 2014-09-26: qty 2

## 2014-09-26 MED ORDER — ACETAMINOPHEN 325 MG PO TABS
650.0000 mg | ORAL_TABLET | Freq: Once | ORAL | Status: AC
  Administered 2014-09-26: 650 mg via ORAL
  Filled 2014-09-26: qty 2

## 2014-09-26 NOTE — ED Notes (Signed)
MD at bedside. 

## 2014-09-26 NOTE — ED Notes (Signed)
She lost her balance and fell backward against another person. The elevator door shut on her. C.o back pain. Abrasion to her right wrist.

## 2014-09-26 NOTE — Discharge Instructions (Signed)
Ms. Hearn had a CT scan of her abdomen today that demonstrated an old compression fracture on your lower back.  You also have an aortic aneurysm that is 5.1 by 5.4 cm in size, slightly increased from the last study.  Please follow up with your family doctor or vascular surgeon regarding this.  She can take tylenol, 650 mg every six hours as needed for pain.   Back Pain, Adult Low back pain is very common. About 1 in 5 people have back pain.The cause of low back pain is rarely dangerous. The pain often gets better over time.About half of people with a sudden onset of back pain feel better in just 2 weeks. About 8 in 10 people feel better by 6 weeks.  CAUSES Some common causes of back pain include:  Strain of the muscles or ligaments supporting the spine.  Wear and tear (degeneration) of the spinal discs.  Arthritis.  Direct injury to the back. DIAGNOSIS Most of the time, the direct cause of low back pain is not known.However, back pain can be treated effectively even when the exact cause of the pain is unknown.Answering your caregiver's questions about your overall health and symptoms is one of the most accurate ways to make sure the cause of your pain is not dangerous. If your caregiver needs more information, he or she may order lab work or imaging tests (X-rays or MRIs).However, even if imaging tests show changes in your back, this usually does not require surgery. HOME CARE INSTRUCTIONS For many people, back pain returns.Since low back pain is rarely dangerous, it is often a condition that people can learn to [facility] their own.   Remain active. It is stressful on the back to sit or stand in one place. Do not sit, drive, or stand in one place for more than 30 minutes at a time. Take short walks on level surfaces as soon as pain allows.Try to increase the length of time you walk each day.  Do not stay in bed.Resting more than 1 or 2 days can delay your recovery.  Do not avoid  exercise or work.Your body is made to move.It is not dangerous to be active, even though your back may hurt.Your back will likely heal faster if you return to being active before your pain is gone.  Pay attention to your body when you bend and lift. Many people have less discomfortwhen lifting if they bend their knees, keep the load close to their bodies,and avoid twisting. Often, the most comfortable positions are those that put less stress on your recovering back.  Find a comfortable position to sleep. Use a firm mattress and lie on your side with your knees slightly bent. If you lie on your back, put a pillow under your knees.  Only take over-the-counter or prescription medicines as directed by your caregiver. Over-the-counter medicines to reduce pain and inflammation are often the most helpful.Your caregiver may prescribe muscle relaxant drugs.These medicines help dull your pain so you can more quickly return to your normal activities and healthy exercise.  Put ice on the injured area.  Put ice in a plastic bag.  Place a towel between your skin and the bag.  Leave the ice on for 15-20 minutes, 03-04 times a day for the first 2 to 3 days. After that, ice and heat may be alternated to reduce pain and spasms.  Ask your caregiver about trying back exercises and gentle massage. This may be of some benefit.  Avoid feeling anxious  or stressed.Stress increases muscle tension and can worsen back pain.It is important to recognize when you are anxious or stressed and learn ways to manage it.Exercise is a great option. SEEK MEDICAL CARE IF:  You have pain that is not relieved with rest or medicine.  You have pain that does not improve in 1 week.  You have new symptoms.  You are generally not feeling well. SEEK IMMEDIATE MEDICAL CARE IF:   You have pain that radiates from your back into your legs.  You develop new bowel or bladder control problems.  You have unusual weakness or  numbness in your arms or legs.  You develop nausea or vomiting.  You develop abdominal pain.  You feel faint. Document Released: 03/14/2005 Document Revised: 09/13/2011 Document Reviewed: 07/16/2013 [facility] Patient Information 2015 [place], [place]. This information is not intended to replace advice given to you by your health care provider. Make sure you discuss any questions you have with your health care provider.  Fall Prevention and Home Safety Falls cause injuries and can affect all age groups. It is possible to use preventive measures to significantly decrease the likelihood of falls. There are many simple measures which can make your home safer and prevent falls. OUTDOORS  Repair cracks and edges of walkways and driveways.  Remove high doorway thresholds.  Trim shrubbery on the main path into your home.  Have good outside lighting.  Clear walkways of tools, rocks, debris, and clutter.  Check that handrails are not broken and are securely fastened. Both sides of steps should have handrails.  Have leaves, snow, and ice cleared regularly.  Use sand or salt on walkways during winter months.  In the garage, clean up grease or oil spills. BATHROOM  Install night lights.  Install grab bars by the toilet and in the tub and shower.  Use non-skid mats or decals in the tub or shower.  Place a plastic non-slip stool in the shower to sit on, if needed.  Keep floors dry and clean up all water on the floor immediately.  Remove soap buildup in the tub or shower on a regular basis.  Secure bath mats with non-slip, double-sided rug tape.  Remove throw rugs and tripping hazards from the floors. BEDROOMS  Install night lights.  Make sure a bedside light is easy to reach.  Do not use oversized bedding.  Keep a telephone by your bedside.  Have a firm chair with side arms to use for getting dressed.  Remove throw rugs and tripping hazards from the floor. KITCHEN  Keep  handles on pots and pans turned toward the center of the stove. Use back burners when possible.  Clean up spills quickly and allow time for drying.  Avoid walking on wet floors.  Avoid hot utensils and knives.  Position shelves so they are not too high or low.  Place commonly used objects within easy reach.  If necessary, use a sturdy step stool with a grab bar when reaching.  Keep electrical cables out of the way.  Do not use floor polish or wax that makes floors slippery. If you must use wax, use non-skid floor wax.  Remove throw rugs and tripping hazards from the floor. STAIRWAYS  Never leave objects on stairs.  Place handrails on both sides of stairways and use them. Fix any loose handrails. Make sure handrails on both sides of the stairways are as long as the stairs.  Check carpeting to make sure it is firmly attached along stairs. Make  repairs to worn or loose carpet promptly.  Avoid placing throw rugs at the top or bottom of stairways, or properly secure the rug with carpet tape to prevent slippage. Get rid of throw rugs, if possible.  Have an electrician put in a light switch at the top and bottom of the stairs. OTHER FALL PREVENTION TIPS  Wear low-heel or rubber-soled shoes that are supportive and fit well. Wear closed toe shoes.  When using a stepladder, make sure it is fully opened and both spreaders are firmly locked. Do not climb a closed stepladder.  Add color or contrast paint or tape to grab bars and handrails in your home. Place contrasting color strips on first and last steps.  Learn and use mobility aids as needed. Install an electrical emergency response system.  Turn on lights to avoid dark areas. Replace light bulbs that burn out immediately. Get light switches that glow.  Arrange furniture to create clear pathways. Keep furniture in the same place.  Firmly attach carpet with non-skid or double-sided tape.  Eliminate uneven floor surfaces.  Select  a carpet pattern that does not visually hide the edge of steps.  Be aware of all pets. OTHER HOME SAFETY TIPS  Set the water temperature for 120 F (48.8 C).  Keep emergency numbers on or near the telephone.  Keep smoke detectors on every level of the home and near sleeping areas. Document Released: 03/04/2002 Document Revised: 09/13/2011 Document Reviewed: 06/03/2011 [facility] Patient Information 2015 [place], [place]. This information is not intended to replace advice given to you by your health care provider. Make sure you discuss any questions you have with your health care provider.  Urinary Tract Infection Urinary tract infections (UTIs) can develop anywhere along your urinary tract. Your urinary tract is your body's drainage system for removing wastes and extra water. Your urinary tract includes two kidneys, two ureters, a bladder, and a urethra. Your kidneys are a pair of bean-shaped organs. Each kidney is about the size of your fist. They are located below your ribs, one on each side of your spine. CAUSES Infections are caused by microbes, which are microscopic organisms, including fungi, viruses, and bacteria. These organisms are so small that they can only be seen through a microscope. Bacteria are the microbes that most commonly cause UTIs. SYMPTOMS  Symptoms of UTIs may vary by age and gender of the patient and by the location of the infection. Symptoms in young women typically include a frequent and intense urge to urinate and a painful, burning feeling in the bladder or urethra during urination. Older women and men are more likely to be tired, shaky, and weak and have muscle aches and abdominal pain. A fever may mean the infection is in your kidneys. Other symptoms of a kidney infection include pain in your back or sides below the ribs, nausea, and vomiting. DIAGNOSIS To diagnose a UTI, your caregiver will ask you about your symptoms. Your caregiver also will ask to provide a urine  sample. The urine sample will be tested for bacteria and white blood cells. White blood cells are made by your body to help fight infection. TREATMENT  Typically, UTIs can be treated with medication. Because most UTIs are caused by a bacterial infection, they usually can be treated with the use of antibiotics. The choice of antibiotic and length of treatment depend on your symptoms and the type of bacteria causing your infection. HOME CARE INSTRUCTIONS  If you were prescribed antibiotics, take them exactly as your  caregiver instructs you. Finish the medication even if you feel better after you have only taken some of the medication.  Drink enough water and fluids to keep your urine clear or pale yellow.  Avoid caffeine, tea, and carbonated beverages. They tend to irritate your bladder.  Empty your bladder often. Avoid holding urine for long periods of time.  Empty your bladder before and after sexual intercourse.  After a bowel movement, women should cleanse from front to back. Use each tissue only once. SEEK MEDICAL CARE IF:   You have back pain.  You develop a fever.  Your symptoms do not begin to resolve within 3 days. SEEK IMMEDIATE MEDICAL CARE IF:   You have severe back pain or lower abdominal pain.  You develop chills.  You have nausea or vomiting.  You have continued burning or discomfort with urination. MAKE SURE YOU:   Understand these instructions.  Will watch your condition.  Will get help right away if you are not doing well or get worse. Document Released: 12/22/2004 Document Revised: 09/13/2011 Document Reviewed: 24-Sep-202013 [facility] Patient Information 2015 [place], [place]. This information is not intended to replace advice given to you by your health care provider. Make sure you discuss any questions you have with your health care provider.

## 2014-09-26 NOTE — ED Provider Notes (Signed)
CSN: [id number]     Arrival date & time 09/26/14  2012 History  This chart was scribed for Quintella Reichert, MD by Irene Pap, ED Scribe. This patient was seen in room MH03/MH03 and patient care was started at 9:10 PM.   Chief Complaint  Patient presents with  . Fall   The history is provided by a caregiver, a relative and the patient. The history is limited by the condition of the patient. No language interpreter was used.    HPI Comments: Virginia Davidson is a 81 y.o. Female with dementia and abdominal aneurysm who presents to the Emergency Department complaining of a fall onset 2 hours ago. Caregiver states that the pt fell against her when she took her hands off her walker and became dizzy; states it was a hard fall that occurred in the elevator. Pt is complaining of back pain and some abdominal pain with a non-painful abrasion to the right hand; caregiver states the pt has been complaining of abdominal pain but pt states that she currently not experiencing any pain. Family member states that the pt has an abdominal  aneurysm and recently had a UTI: had associated diarrhea and was diagnosed with colitis; also a rod in right leg and hip. Pt is not oriented to place.  She denies hitting head or LOC. Family denies history of kidney disease. Level V caveat due to dementia.  Past Medical History  Diagnosis Date  . Hypertension   . Thyroid disease   . Dementia   . Abdominal aneurysm    Past Surgical History  Procedure Laterality Date  . Bladder surgery N/A     x2  . Hip surgery     No family history on file. History  Substance Use Topics  . Smoking status: Never Smoker   . Smokeless tobacco: Never Used  . Alcohol Use: No   OB History    No data available     Review of Systems  Gastrointestinal: Positive for abdominal pain.  Musculoskeletal: Positive for back pain.  Skin:       Abrasion to right wrist  All other systems reviewed and are negative.  Allergies  Alendronate sodium;  Penicillins; Sulfamethoxazole; and Tetracyclines & related  Home Medications   Prior to Admission medications   Medication Sig Start Date End Date Taking? Authorizing Provider  amLODipine (NORVASC) 2.5 MG tablet Take 2.5 mg by mouth 2 (two) times daily.    Historical Provider, MD  Calcium Carbonate-Vitamin D 600-200 MG-UNIT CAPS Take 1 tablet by mouth 2 (two) times daily.    Historical Provider, MD  levothyroxine (SYNTHROID, LEVOTHROID) 100 MCG tablet Take 100 mcg by mouth daily before breakfast.    Historical Provider, MD  lisinopril (PRINIVIL,ZESTRIL) 10 MG tablet Take 20 mg by mouth daily.     Historical Provider, MD  LORazepam (ATIVAN) 0.5 MG tablet Take 0.5 mg by mouth at bedtime.    Historical Provider, MD  memantine (NAMENDA) 10 MG tablet Take 10 mg by mouth daily.    Historical Provider, MD  nitroGLYCERIN (NITROSTAT) 0.4 MG SL tablet Place 0.4 mg under the tongue every 5 (five) minutes as needed for chest pain.    Historical Provider, MD  potassium chloride (K-DUR) 10 MEQ tablet Take 10 mEq by mouth 2 (two) times daily.    Historical Provider, MD  traMADol Veatrice Bourbon) 50 MG tablet Take one tablet by mouth three times daily for pain 12/18/13   Estill Dooms, MD   BP 146/59 mmHg  Pulse  81  Temp(Src) 98.1 F (36.7 C) (Oral)  Resp 18  Ht 5\' 4"  (1.626 m)  Wt 138 lb (62.596 kg)  BMI 23.68 kg/m2  SpO2 97% Physical Exam  Constitutional: She appears well-developed and well-nourished.  HENT:  Head: Normocephalic and atraumatic.  Cardiovascular: Normal rate and regular rhythm.   No murmur heard. Pulmonary/Chest: Effort normal and breath sounds normal. No respiratory distress.  Abdominal: Soft. There is no tenderness. There is no rebound and no guarding.  Musculoskeletal: She exhibits no edema or tenderness.  Abrasion to right dorsal hand without any local tenderness. No discrete back tenderness to palpation. 2+ femoral pulses bilaterally  Neurological: She is alert.  Disoriented to  place and time, moves all extremities symmetrically 5 out of 5 strength in bilateral lower and bilateral upper extremities  Skin: Skin is warm and dry.  Psychiatric: She has a normal mood and affect. Her behavior is normal.  Nursing note and vitals reviewed.   ED Course  Procedures (including critical care time) DIAGNOSTIC STUDIES: Oxygen Saturation is 97% on RA, normal by my interpretation.    COORDINATION OF CARE: 9:17 PM-Discussed treatment plan which includes CT scan without contrast, labs and tylenol with family at bedside and family agreed to plan.   Labs Review Labs Reviewed  URINALYSIS, ROUTINE W REFLEX MICROSCOPIC (NOT AT [facility]) - Abnormal; Notable for the following:    APPearance CLOUDY (*)    Hgb urine dipstick SMALL (*)    Leukocytes, UA LARGE (*)    All other components within normal limits  COMPREHENSIVE METABOLIC PANEL - Abnormal; Notable for the following:    Chloride 100 (*)    Glucose, Bld 116 (*)    Total Protein 6.3 (*)    GFR calc non Af Amer 57 (*)    All other components within normal limits  CBC WITH DIFFERENTIAL/PLATELET - Abnormal; Notable for the following:    Neutrophils Relative % 78 (*)    Lymphocytes Relative 8 (*)    All other components within normal limits  URINE MICROSCOPIC-ADD ON - Abnormal; Notable for the following:    Bacteria, UA MANY (*)    All other components within normal limits  URINE CULTURE    Imaging Review Ct Abdomen Pelvis Wo Contrast  09/26/2014   CLINICAL DATA:  Status post fall. Dizziness, back pain and abdominal pain. Initial encounter.  EXAM: CT ABDOMEN AND PELVIS WITHOUT CONTRAST  TECHNIQUE: Multidetector CT imaging of the abdomen and pelvis was performed following the standard protocol without IV contrast.  COMPARISON:  CT of the abdomen and pelvis from 06/21/2014  FINDINGS: Mild bibasilar atelectasis is noted. A relatively large decompressed hiatal hernia is seen. Diffuse coronary artery calcifications are noted.  The  liver and spleen are unremarkable in appearance. A large stone is noted within the decompressed gallbladder. There is persistent dilatation of the common bile duct, measuring 1.3 cm in diameter, similar in appearance to the prior study. The pancreas and adrenal glands are grossly unremarkable.  Prominent bilateral renal parapelvic cysts are seen. There is no definite evidence of hydronephrosis. Scattered tiny calcifications at the right kidney are thought to be vascular in nature. No renal or ureteral stones are seen. No perinephric stranding is appreciated.  No free fluid is identified. There is herniation of a short segment of distal ileum into a right inguinal hernia, without evidence for obstruction or strangulation. No significant associated soft tissue inflammation is seen at this time. The remaining small bowel loops are grossly unremarkable. The  stomach is within normal limits. No acute vascular abnormalities are seen.  Relatively diffuse calcification is seen along the abdominal aorta and its branches. The patient's infrarenal abdominal aortic aneurysm measures 5.4 cm in AP dimension and 5.1 cm in transverse dimension, slightly increased in size from the prior study. The lumen is not well assessed without contrast, but appears grossly stable.  The appendix is normal in caliber, without evidence of appendicitis. Scattered diverticulosis is noted along the splenic flexure of the colon, and along the descending and sigmoid colon, without evidence of diverticulitis.  The bladder is mildly distended and grossly unremarkable. The uterus is unremarkable in appearance. The ovaries are grossly symmetric. No suspicious adnexal masses are seen. No inguinal lymphadenopathy is seen.  No acute osseous abnormalities are identified. The patient's right hip arthroplasty is grossly unremarkable in appearance, though incompletely imaged. There is mild chronic compression deformity involving vertebral body L2, and multilevel  vacuum phenomenon is noted along the lower thoracic and lumbar spine. There is grade 1 retrolisthesis of T12 on L1, and grade 1 anterolisthesis of L4 on L5, reflecting underlying facet disease.  IMPRESSION: 1. No evidence of traumatic injury to the abdomen or pelvis. 2. New herniation of a short segment of distal ileum into a right inguinal hernia, without evidence for obstruction or strangulation. 3. Stable dilatation of the common bile duct to 1.3 cm, similar in appearance to the prior study and possibly reflecting the patient's baseline. 4. Cholelithiasis; gallbladder otherwise unremarkable. 5. Infrarenal abdominal aortic aneurysm measures 5.4 cm in AP dimension and 5.1 cm in transverse dimension, slightly increased in size from the prior study. The lumen is not well assessed without contrast, but appears grossly stable. Underlying diffuse calcification along the abdominal aorta and its branches. Recommend followup by abdomen and pelvis CTA in 3-6 months, and vascular surgery referral/consultation if not already obtained. This recommendation follows ACR consensus guidelines: White Paper of the ACR Incidental Findings Committee II on Vascular Findings. J Am Coll Radiol 2013; 10:789-794. 6. Relatively large decompressed hiatal hernia seen. 7. Diffuse coronary artery calcifications seen. 8. Scattered diverticulosis noted along the splenic flexure of the colon, and along the descending and sigmoid colon, without evidence of diverticulitis. 9. Prominent bilateral renal parapelvic cysts noted. 10. Mild bibasilar atelectasis seen. 11. Mild diffuse degenerative change along the lumbar spine, with mild chronic compression deformity at vertebral body L2.   Electronically Signed   By: Garald Balding M.D.   On: 09/26/2014 22:15     EKG Interpretation None      MDM   Final diagnoses:  Fall, initial encounter  Midline low back pain without sciatica  UTI (lower urinary tract infection)   Patient here for  evaluation of injuries following a fall, no history of head trauma. Patient does not have any focal hip tenderness on examination and is able to range bilateral hips. Family reports the patient complained of abdominal pain earlier, this does not appear to be present on examination the emergency department. UAs concerning for urinary tract infection, will treat with Keflex. CT abdomen and pelvis without any evidence of acute fracture or acute change in aneurysm size. Discussed with family home care and follow-up.  I personally performed the services described in this documentation, which was scribed in my presence. The recorded information has been reviewed and is accurate.    Quintella Reichert, MD 09/27/14 [phone]

## 2014-09-28 LAB — URINE CULTURE: Culture: >=100000

## 2014-09-30 ENCOUNTER — Telehealth (HOSPITAL_COMMUNITY): Payer: Self-pay

## 2014-09-30 NOTE — Telephone Encounter (Signed)
Post ED Visit - Positive Culture Follow-up  Culture report reviewed by antimicrobial stewardship pharmacist: []  Wes Dulaney, Pharm.D., BCPS [x]  Heide Guile, Pharm.D., BCPS []  Alycia Rossetti, Pharm.D., BCPS []  Zoar, Pharm.D., BCPS, AAHIVP []  Legrand Como, Pharm.D., BCPS, AAHIVP []  Isac Sarna, Pharm.D., BCPS  Positive Urine culture, >/= 100,000 colonies -> Streptococcus Agalactiae Treated with Cephalexin, organism sensitive to the same and no further patient follow-up is required at this time.  Dortha Kern 09/30/2014, 6:24 AM

## 2014-10-02 ENCOUNTER — Encounter: Payer: Self-pay | Admitting: Family Medicine

## 2014-10-02 ENCOUNTER — Ambulatory Visit (INDEPENDENT_AMBULATORY_CARE_PROVIDER_SITE_OTHER): Payer: Medicare Other | Admitting: Family Medicine

## 2014-10-02 ENCOUNTER — Ambulatory Visit (HOSPITAL_BASED_OUTPATIENT_CLINIC_OR_DEPARTMENT_OTHER)
Admission: RE | Admit: 2014-10-02 | Discharge: 2014-10-02 | Disposition: A | Discharge status: Home / Self Care | Payer: Medicare Other | Source: Ambulatory Visit | Attending: Family Medicine | Admitting: Family Medicine

## 2014-10-02 VITALS — BP 118/74 | HR 70 | Temp 98.1°F | Wt 133.8 lb

## 2014-10-02 DIAGNOSIS — M791 Myalgia: Secondary | ICD-10-CM | POA: Diagnosis not present

## 2014-10-02 DIAGNOSIS — M4856XA Collapsed vertebra, not elsewhere classified, lumbar region, initial encounter for fracture: Secondary | ICD-10-CM | POA: Diagnosis not present

## 2014-10-02 DIAGNOSIS — K449 Diaphragmatic hernia without obstruction or gangrene: Secondary | ICD-10-CM

## 2014-10-02 DIAGNOSIS — M8580 Other specified disorders of bone density and structure, unspecified site: Secondary | ICD-10-CM | POA: Insufficient documentation

## 2014-10-02 DIAGNOSIS — M5489 Other dorsalgia: Secondary | ICD-10-CM | POA: Diagnosis not present

## 2014-10-02 DIAGNOSIS — M545 Low back pain, unspecified: Secondary | ICD-10-CM

## 2014-10-02 DIAGNOSIS — K5900 Constipation, unspecified: Secondary | ICD-10-CM | POA: Insufficient documentation

## 2014-10-02 DIAGNOSIS — M810 Age-related osteoporosis without current pathological fracture: Secondary | ICD-10-CM | POA: Diagnosis not present

## 2014-10-02 DIAGNOSIS — E039 Hypothyroidism, unspecified: Secondary | ICD-10-CM | POA: Diagnosis not present

## 2014-10-02 DIAGNOSIS — S3992XA Unspecified injury of lower back, initial encounter: Secondary | ICD-10-CM | POA: Diagnosis not present

## 2014-10-02 DIAGNOSIS — M609 Myositis, unspecified: Secondary | ICD-10-CM

## 2014-10-02 DIAGNOSIS — M549 Dorsalgia, unspecified: Secondary | ICD-10-CM | POA: Diagnosis not present

## 2014-10-02 DIAGNOSIS — R29890 Loss of height: Secondary | ICD-10-CM | POA: Insufficient documentation

## 2014-10-02 DIAGNOSIS — S299XXA Unspecified injury of thorax, initial encounter: Secondary | ICD-10-CM | POA: Diagnosis not present

## 2014-10-02 DIAGNOSIS — IMO0001 Reserved for inherently not codable concepts without codable children: Secondary | ICD-10-CM

## 2014-10-02 MED ORDER — RANITIDINE HCL 300 MG PO TABS: 300.0000 mg | ORAL_TABLET | Freq: Every day | ORAL | Status: DC

## 2014-10-02 MED ORDER — TIZANIDINE HCL 4 MG PO TABS: 4.0000 mg | ORAL_TABLET | Freq: Three times a day (TID) | ORAL | Status: DC | PRN

## 2014-10-02 NOTE — Progress Notes (Signed)
Pre visit review using our clinic review tool, if applicable. No additional management support is needed unless otherwise documented below in the visit note. 

## 2014-10-02 NOTE — Progress Notes (Signed)
Patient ID: Virginia Davidson, female    DOB: [birth year]  Age: 81 y.o. MRN: [id number]    Subjective:  Subjective HPI Virginia Davidson presents with daughter in law and caretaker to establish.  She fell several days ago and was seen in ER here.  CT abd and pelvis was done with no new findings.  She has hx gallstones and is c/o abd pain and back pain after fall.  . Er note and imaging reviewed.   Review of Systems  Constitutional: Positive for activity change and appetite change. Negative for fatigue and unexpected weight change.  Respiratory: Negative for cough and shortness of breath.   Cardiovascular: Negative for chest pain and palpitations.  Musculoskeletal: Positive for back pain, arthralgias and gait problem. Negative for myalgias, joint swelling, neck pain and neck stiffness.  Psychiatric/Behavioral: Positive for dysphoric mood. Negative for suicidal ideas, hallucinations, behavioral problems, sleep disturbance and self-injury. The patient is not nervous/anxious.     History Past Medical History  Diagnosis Date  . Hypertension   . Thyroid disease   . Dementia   . Abdominal aneurysm   . Diverticulitis   . Glaucoma   . Urinary incontinence     She has past surgical history that includes Bladder surgery (N/A) and Hip surgery (Right).   Her family history includes Stroke in her mother.She reports that she has never smoked. She has never used smokeless tobacco. She reports that she does not drink alcohol or use illicit drugs.  Current Outpatient Prescriptions on File Prior to Visit  Medication Sig Dispense Refill  . Calcium Carbonate-Vitamin D 600-200 MG-UNIT CAPS Take 1 tablet by mouth 2 (two) times daily.    . cephALEXin (KEFLEX) 500 MG capsule Take 1 capsule (500 mg total) by mouth 2 (two) times daily. 10 capsule 0  . lisinopril (PRINIVIL,ZESTRIL) 10 MG tablet Take 20 mg by mouth daily.     . nitroGLYCERIN (NITROSTAT) 0.4 MG SL tablet Place 0.4 mg under the tongue every 5 (five)  minutes as needed for chest pain.     No current facility-administered medications on file prior to visit.     Objective:  Objective Physical Exam  Constitutional: She is oriented to person, place, and time. She appears well-developed and well-nourished.  HENT:  Head: Normocephalic and atraumatic.  Neck: Normal range of motion. Neck supple. No JVD present. Carotid bruit is not present. No thyromegaly present.  Cardiovascular: Normal rate, regular rhythm and normal heart sounds.   No murmur heard. Pulmonary/Chest: Effort normal and breath sounds normal. No respiratory distress. She has no wheezes. She has no rales. She exhibits no tenderness.  Abdominal: She exhibits no distension and no mass. There is no tenderness. There is no rebound and no guarding.  Musculoskeletal: She exhibits tenderness. She exhibits no edema.       Thoracic back: She exhibits tenderness, pain and spasm. She exhibits no bony tenderness, no swelling, no edema, no deformity and no laceration.       Back:  Neurological: She is alert and oriented to person, place, and time.  Skin: No rash noted. No erythema.  Psychiatric: She has a normal mood and affect. Her behavior is normal.   BP 118/74 mmHg  Pulse 70  Temp(Src) 98.1 F (36.7 C) (Oral)  Wt 133 lb 12.8 oz (60.691 kg)  SpO2 98% Wt Readings from Last 3 Encounters:  10/02/14 133 lb 12.8 oz (60.691 kg)  09/26/14 138 lb (62.596 kg)  12/24/13 142 lb 3.2 oz (64.501 kg)  Lab Results  Component Value Date   WBC 9.1 09/26/2014   HGB 15.0 09/26/2014   HCT 45.6 09/26/2014   PLT 208 09/26/2014   GLUCOSE 116* 09/26/2014   ALT 17 09/26/2014   AST 21 09/26/2014   NA 136 09/26/2014   K 4.4 09/26/2014   CL 100* 09/26/2014   CREATININE 0.84 09/26/2014   BUN 18 09/26/2014   CO2 30 09/26/2014    Ct Abdomen Pelvis Wo Contrast  09/26/2014   CLINICAL DATA:  Status post fall. Dizziness, back pain and abdominal pain. Initial encounter.  EXAM: CT ABDOMEN AND  PELVIS WITHOUT CONTRAST  TECHNIQUE: Multidetector CT imaging of the abdomen and pelvis was performed following the standard protocol without IV contrast.  COMPARISON:  CT of the abdomen and pelvis from 06/21/2014  FINDINGS: Mild bibasilar atelectasis is noted. A relatively large decompressed hiatal hernia is seen. Diffuse coronary artery calcifications are noted.  The liver and spleen are unremarkable in appearance. A large stone is noted within the decompressed gallbladder. There is persistent dilatation of the common bile duct, measuring 1.3 cm in diameter, similar in appearance to the prior study. The pancreas and adrenal glands are grossly unremarkable.  Prominent bilateral renal parapelvic cysts are seen. There is no definite evidence of hydronephrosis. Scattered tiny calcifications at the right kidney are thought to be vascular in nature. No renal or ureteral stones are seen. No perinephric stranding is appreciated.  No free fluid is identified. There is herniation of a short segment of distal ileum into a right inguinal hernia, without evidence for obstruction or strangulation. No significant associated soft tissue inflammation is seen at this time. The remaining small bowel loops are grossly unremarkable. The stomach is within normal limits. No acute vascular abnormalities are seen.  Relatively diffuse calcification is seen along the abdominal aorta and its branches. The patient's infrarenal abdominal aortic aneurysm measures 5.4 cm in AP dimension and 5.1 cm in transverse dimension, slightly increased in size from the prior study. The lumen is not well assessed without contrast, but appears grossly stable.  The appendix is normal in caliber, without evidence of appendicitis. Scattered diverticulosis is noted along the splenic flexure of the colon, and along the descending and sigmoid colon, without evidence of diverticulitis.  The bladder is mildly distended and grossly unremarkable. The uterus is  unremarkable in appearance. The ovaries are grossly symmetric. No suspicious adnexal masses are seen. No inguinal lymphadenopathy is seen.  No acute osseous abnormalities are identified. The patient's right hip arthroplasty is grossly unremarkable in appearance, though incompletely imaged. There is mild chronic compression deformity involving vertebral body L2, and multilevel vacuum phenomenon is noted along the lower thoracic and lumbar spine. There is grade 1 retrolisthesis of T12 on L1, and grade 1 anterolisthesis of L4 on L5, reflecting underlying facet disease.  IMPRESSION: 1. No evidence of traumatic injury to the abdomen or pelvis. 2. New herniation of a short segment of distal ileum into a right inguinal hernia, without evidence for obstruction or strangulation. 3. Stable dilatation of the common bile duct to 1.3 cm, similar in appearance to the prior study and possibly reflecting the patient's baseline. 4. Cholelithiasis; gallbladder otherwise unremarkable. 5. Infrarenal abdominal aortic aneurysm measures 5.4 cm in AP dimension and 5.1 cm in transverse dimension, slightly increased in size from the prior study. The lumen is not well assessed without contrast, but appears grossly stable. Underlying diffuse calcification along the abdominal aorta and its branches. Recommend followup by abdomen and pelvis CTA  in 3-6 months, and vascular surgery referral/consultation if not already obtained. This recommendation follows ACR consensus guidelines: White Paper of the ACR Incidental Findings Committee II on Vascular Findings. J Am Coll Radiol 2013; 10:789-794. 6. Relatively large decompressed hiatal hernia seen. 7. Diffuse coronary artery calcifications seen. 8. Scattered diverticulosis noted along the splenic flexure of the colon, and along the descending and sigmoid colon, without evidence of diverticulitis. 9. Prominent bilateral renal parapelvic cysts noted. 10. Mild bibasilar atelectasis seen. 11. Mild diffuse  degenerative change along the lumbar spine, with mild chronic compression deformity at vertebral body L2.   Electronically Signed   By: Garald Balding M.D.   On: 09/26/2014 22:15     Assessment & Plan:  Plan I have discontinued Ms. Bale's amLODipine, memantine, potassium chloride, LORazepam, and traMADol. I am also having her start on ranitidine and tiZANidine. Additionally, I am having her maintain her lisinopril, Calcium Carbonate-Vitamin D, nitroGLYCERIN, cephALEXin, NAMENDA XR, and levothyroxine.  Meds ordered this encounter  Medications  . NAMENDA XR 14 MG CP24 24 hr capsule    Sig: Take 1 capsule by mouth daily.    Refill:  6  . levothyroxine (SYNTHROID, LEVOTHROID) 88 MCG tablet    Sig: Take 1 tablet by mouth daily.  . ranitidine (ZANTAC) 300 MG tablet    Sig: Take 1 tablet (300 mg total) by mouth at bedtime.    Dispense:  30 tablet    Refill:  5  . tiZANidine (ZANAFLEX) 4 MG tablet    Sig: Take 1 tablet (4 mg total) by mouth every 8 (eight) hours as needed for muscle spasms.    Dispense:  30 tablet    Refill:  0    Problem List Items Addressed This Visit    None    Visit Diagnoses    Midline low back pain without sciatica    -  Primary    Relevant Medications    tiZANidine (ZANAFLEX) 4 MG tablet    Other Relevant Orders    DG Lumbar Spine Complete (Completed)    DG Thoracic Spine 2 View (Completed)    Hiatal hernia        Relevant Medications    ranitidine (ZANTAC) 300 MG tablet    Other Relevant Orders    Basic metabolic panel    CBC with Differential/Platelet    Hepatic function panel    Lipid panel    POCT urinalysis dipstick    TSH    Vitamin D 1,25 dihydroxy    Hypothyroidism, unspecified hypothyroidism type        Relevant Medications    levothyroxine (SYNTHROID, LEVOTHROID) 88 MCG tablet    Other Relevant Orders    Basic metabolic panel    CBC with Differential/Platelet    Hepatic function panel    Lipid panel    POCT urinalysis dipstick    TSH     Vitamin D 1,25 dihydroxy    Myalgia and myositis        Relevant Orders    Vitamin D 1,25 dihydroxy    Osteoporosis        Relevant Orders    Vitamin D 1,25 dihydroxy       Follow-up: Return in about 3 months (around 01/02/2015), or if symptoms worsen or fail to improve, for annual exam.  Garnet Koyanagi, DO

## 2014-10-02 NOTE — Patient Instructions (Signed)
Hiatal Hernia A hiatal hernia occurs when part of your stomach slides above the muscle that separates your abdomen from your chest (diaphragm). You can be born with a hiatal hernia (congenital), or it may develop over time. In almost all cases of hiatal hernia, only the top part of the stomach pushes through.  Many people have a hiatal hernia with no symptoms. The larger the hernia, the more likely that you will have symptoms. In some cases, a hiatal hernia allows stomach acid to flow back into the tube that carries food from your mouth to your stomach (esophagus). This may cause heartburn symptoms. Severe heartburn symptoms may mean you have developed a condition called gastroesophageal reflux disease (GERD).  CAUSES  Hiatal hernias are caused by a weakness in the opening (hiatus) where your esophagus passes through your diaphragm to attach to the upper part of your stomach. You may be born with a weakness in your hiatus, or a weakness can develop. RISK FACTORS Older age is a major risk factor for a hiatal hernia. Anything that increases pressure on your diaphragm can also increase your risk of a hiatal hernia. This includes:  Pregnancy.  Excess weight.  Frequent constipation. SIGNS AND SYMPTOMS  People with a hiatal hernia often have no symptoms. If symptoms develop, they are almost always caused by GERD. They may include:  Heartburn.  Belching.  Indigestion.  Trouble swallowing.  Coughing or wheezing.  Sore throat.  Hoarseness.  Chest pain. DIAGNOSIS  A hiatal hernia is sometimes found during an exam for another problem. Your health care provider may suspect a hiatal hernia if you have symptoms of GERD. Tests may be done to diagnose GERD. These may include:  X-rays of your stomach or chest.  An upper gastrointestinal (GI) series. This is an X-ray exam of your GI tract involving the use of a chalky liquid that you swallow. The liquid shows up clearly on the X-ray.  Endoscopy.  This is a procedure to look into your stomach using a thin, flexible tube that has a tiny camera and light on the end of it. TREATMENT  If you have no symptoms, you may not need treatment. If you have symptoms, treatment may include:  Dietary and lifestyle changes to help reduce GERD symptoms.  Medicines. These may include:  Over-the-counter antacids.  Medicines that make your stomach empty more quickly.  Medicines that block the production of stomach acid (H2 blockers).  Stronger medicines to reduce stomach acid (proton pump inhibitors).  You may need surgery to repair the hernia if other treatments are not helping. HOME CARE INSTRUCTIONS   Take all medicines as directed by your health care provider.  Quit smoking, if you smoke.  Try to achieve and maintain a healthy body weight.  Eat frequent small meals instead of three large meals a day. This keeps your stomach from getting too full.  Eat slowly.  Do not lie down right after eating.  Do noteat 1-2 hours before bed.   Do not drink beverages with caffeine. These include cola, coffee, cocoa, and tea.  Do not drink alcohol.  Avoid foods that can make symptoms of GERD worse. These may include:  Fatty foods.  Citrus fruits.  Other foods and drinks that contain acid.  Avoid putting pressure on your belly. Anything that puts pressure on your belly increases the amount of acid that may be pushed up into your esophagus.   Avoid bending over, especially after eating.  Raise the head of your bed   by putting blocks under the legs. This keeps your head and esophagus higher than your stomach.  Do not wear tight clothing around your chest or stomach.  Try not to strain when having a bowel movement, when urinating, or when lifting heavy objects. SEEK MEDICAL CARE IF:  Your symptoms are not controlled with medicines or lifestyle changes.  You are having trouble swallowing.  You have coughing or wheezing that will not  go away. SEEK IMMEDIATE MEDICAL CARE IF:  Your pain is getting worse.  Your pain spreads to your arms, neck, jaw, teeth, or back.  You have shortness of breath.  You sweat for no reason.  You feel sick to your stomach (nauseous) or vomit.  You vomit blood.  You have bright red blood in your stools.  You have black, tarry stools.  Document Released: 06/04/2003 Document Revised: 07/29/2013 Document Reviewed: 03/01/2013 Exit[employer] Patient Information 2015 [employer]. This information is not intended to replace advice given to you by your health care provider. Make sure you discuss any questions you have with your health care provider.  

## 2014-10-03 ENCOUNTER — Telehealth: Payer: Self-pay | Admitting: Family Medicine

## 2014-10-03 DIAGNOSIS — S22000A Wedge compression fracture of unspecified thoracic vertebra, initial encounter for closed fracture: Secondary | ICD-10-CM

## 2014-10-03 DIAGNOSIS — M5136 Other intervertebral disc degeneration, lumbar region: Secondary | ICD-10-CM

## 2014-10-03 LAB — HEPATIC FUNCTION PANEL
ALT: 14 U/L (ref 0–35)
AST: 17 U/L (ref 0–37)
Albumin: 3.5 g/dL (ref 3.5–5.2)
Alkaline Phosphatase: 76 U/L (ref 39–117)
BILIRUBIN DIRECT: 0.1 mg/dL (ref 0.0–0.3)
Total Bilirubin: 0.5 mg/dL (ref 0.2–1.2)
Total Protein: 6.2 g/dL (ref 6.0–8.3)

## 2014-10-03 LAB — CBC WITH DIFFERENTIAL/PLATELET
BASOS ABS: 0.1 10*3/uL (ref 0.0–0.1)
BASOS PCT: 1.9 % (ref 0.0–3.0)
Eosinophils Absolute: 0.2 10*3/uL (ref 0.0–0.7)
Eosinophils Relative: 3.1 % (ref 0.0–5.0)
HCT: 45 % (ref 36.0–46.0)
HEMOGLOBIN: 15.1 g/dL — AB (ref 12.0–15.0)
LYMPHS ABS: 1.2 10*3/uL (ref 0.7–4.0)
LYMPHS PCT: 16.5 % (ref 12.0–46.0)
MCHC: 33.6 g/dL (ref 30.0–36.0)
MCV: 89.9 fl (ref 78.0–100.0)
MONOS PCT: 12.1 % — AB (ref 3.0–12.0)
Monocytes Absolute: 0.8 10*3/uL (ref 0.1–1.0)
NEUTROS ABS: 4.6 10*3/uL (ref 1.4–7.7)
Neutrophils Relative %: 66.4 % (ref 43.0–77.0)
PLATELETS: 240 10*3/uL (ref 150.0–400.0)
RBC: 5.01 Mil/uL (ref 3.87–5.11)
RDW: 15.1 % (ref 11.5–15.5)
WBC: 7 10*3/uL (ref 4.0–10.5)

## 2014-10-03 LAB — LIPID PANEL
Cholesterol: 145 mg/dL (ref 0–200)
HDL: 50.1 mg/dL (ref >39.00)
LDL Cholesterol: 79 mg/dL (ref 0–99)
NONHDL: 94.9
Total CHOL/HDL Ratio: 3
Triglycerides: 78 mg/dL (ref 0.0–149.0)
VLDL: 15.6 mg/dL (ref 0.0–40.0)

## 2014-10-03 LAB — BASIC METABOLIC PANEL
BUN: 16 mg/dL (ref 6–23)
CO2: 29 mEq/L (ref 19–32)
CREATININE: 0.86 mg/dL (ref 0.40–1.20)
Calcium: 9.8 mg/dL (ref 8.4–10.5)
Chloride: 101 mEq/L (ref 96–112)
GFR: 64.97 mL/min (ref >60.00)
Glucose, Bld: 87 mg/dL (ref 70–99)
POTASSIUM: 4.3 meq/L (ref 3.5–5.1)
Sodium: 135 mEq/L (ref 135–145)

## 2014-10-03 LAB — TSH: TSH: 0.69 u[IU]/mL (ref 0.35–4.50)

## 2014-10-03 NOTE — Telephone Encounter (Signed)
Caller name: DIANNE Relationship to patient:DAUGHTER  Can be [phone] Pharmacy:  Reason for call: DR HANDY DOES NOT DO BACKS  SHE NEEDS A DIFFERENT  DOCTOR  SHOULD THE MRI BE DONE PREVIOUS TO SEEING ORTHO

## 2014-10-03 NOTE — Telephone Encounter (Signed)
Discussed with Diane and she verbalized understanding, she stated that Dr.Handy does not see people for backs she she is wanting to see Ortho in [place] who specializes in [place]. I made Jen aware and she will work on the referral.     KP  Notes Recorded by Rosalita Chessman, DO on 10/02/2014 at 5:31 PM + compression fracture ---- ? Old or new--- MRI t spine no contrast  Notes Recorded by Rosalita Chessman, DO on 10/02/2014 at 5:30 PM degen disc disease Osteopenia Chronic fracture--- not new

## 2014-10-03 NOTE — Telephone Encounter (Signed)
The ref has been placed for Ortho the patient is wanting to see someone in [place] who specializes in [place] [employer]. I made Jen aware and she is currently working on it.      KP

## 2014-10-03 NOTE — Telephone Encounter (Signed)
Caller name: diane Relation to pt: daughter in law Call back number: [phone] Pharmacy:  Reason for call:   Virginia Davidson is requesting patients Xray results

## 2014-10-05 LAB — VITAMIN D 1,25 DIHYDROXY
Vitamin D 1, 25 (OH)2 Total: 49 pg/mL (ref 18–72)
Vitamin D2 1, 25 (OH)2: 32 pg/mL
Vitamin D3 1, 25 (OH)2: 17 pg/mL

## 2014-10-07 NOTE — Telephone Encounter (Signed)
What is GI telling her?  She has gallstones it looks like---are they referring her to surgery for evaluation. ?

## 2014-10-07 NOTE — Telephone Encounter (Signed)
She probably should see a surgeon if she is in pain

## 2014-10-07 NOTE — Telephone Encounter (Signed)
They haven't told her anything, that why she called here.    KP

## 2014-10-07 NOTE — Telephone Encounter (Signed)
Virginia Davidson calling back regarding this. She states that she has additional questions regarding Xray and is wanting a callback.

## 2014-10-07 NOTE — Telephone Encounter (Signed)
patient would like your opinion on the gallbladder b/c the GI called and advised of abnormal CT, she wants to know if the results is something she needed to be concerned about. She is also waiting for a return call from GI, the patient see's [facility].     KP

## 2014-10-08 NOTE — Telephone Encounter (Signed)
Called and spoke with the pt's daughter-in-law(Diane) and informed her of the note below.  She asked what did Dr. Etter Sjogren see on the CT.  Informed her that Dr. Etter Sjogren did not receive the results because she didn't order the CT,so Dr. Etter Sjogren did not address the CT.  Informed her that Dr. Etter Sjogren stated that she should talk with GI.  Pt agreed and stated that she will wait to hear from GI.//AB/CMA

## 2014-10-09 DIAGNOSIS — W19XXXA Unspecified fall, initial encounter: Secondary | ICD-10-CM | POA: Diagnosis not present

## 2014-10-09 DIAGNOSIS — M8580 Other specified disorders of bone density and structure, unspecified site: Secondary | ICD-10-CM | POA: Diagnosis not present

## 2014-10-09 DIAGNOSIS — S32030A Wedge compression fracture of third lumbar vertebra, initial encounter for closed fracture: Secondary | ICD-10-CM | POA: Diagnosis not present

## 2014-10-09 DIAGNOSIS — M546 Pain in thoracic spine: Secondary | ICD-10-CM | POA: Diagnosis not present

## 2014-10-09 DIAGNOSIS — S32000A Wedge compression fracture of unspecified lumbar vertebra, initial encounter for closed fracture: Secondary | ICD-10-CM | POA: Diagnosis not present

## 2014-10-10 DIAGNOSIS — N390 Urinary tract infection, site not specified: Secondary | ICD-10-CM | POA: Diagnosis not present

## 2014-10-10 DIAGNOSIS — R3981 Functional urinary incontinence: Secondary | ICD-10-CM | POA: Diagnosis not present

## 2014-10-13 DIAGNOSIS — K8020 Calculus of gallbladder without cholecystitis without obstruction: Secondary | ICD-10-CM | POA: Diagnosis not present

## 2014-10-13 DIAGNOSIS — K449 Diaphragmatic hernia without obstruction or gangrene: Secondary | ICD-10-CM | POA: Diagnosis not present

## 2014-10-13 DIAGNOSIS — K4090 Unilateral inguinal hernia, without obstruction or gangrene, not specified as recurrent: Secondary | ICD-10-CM | POA: Diagnosis not present

## 2014-10-13 DIAGNOSIS — G309 Alzheimer's disease, unspecified: Secondary | ICD-10-CM | POA: Diagnosis not present

## 2014-10-13 DIAGNOSIS — I714 Abdominal aortic aneurysm, without rupture: Secondary | ICD-10-CM | POA: Diagnosis not present

## 2014-10-31 ENCOUNTER — Telehealth: Payer: Self-pay

## 2014-10-31 MED ORDER — LISINOPRIL 10 MG PO TABS: 10.0000 mg | ORAL_TABLET | Freq: Every day | ORAL | Status: DC

## 2014-10-31 MED ORDER — NAMENDA XR 14 MG PO CP24: 14.0000 mg | ORAL_CAPSULE | Freq: Every day | ORAL | Status: DC

## 2014-10-31 MED ORDER — ALPRAZOLAM 0.25 MG PO TABS: 0.2500 mg | ORAL_TABLET | Freq: Three times a day (TID) | ORAL | Status: DC | PRN

## 2014-10-31 MED ORDER — LEVOTHYROXINE SODIUM 88 MCG PO TABS: 88.0000 ug | ORAL_TABLET | Freq: Every day | ORAL | Status: DC

## 2014-10-31 NOTE — Telephone Encounter (Signed)
VM left advising Rx being faxed.      KP

## 2014-10-31 NOTE — Telephone Encounter (Signed)
Requesting Xanax 0.25 for the patient, she had a reaction to the Ativan (it was causing her to fall) she said the Xanax is only given as needed or she is requesting something to keep her calm but not drugged. She doesn't want to keep her sleep all day, just something that will help her relax. Please advise      KP

## 2014-10-31 NOTE — Telephone Encounter (Signed)
Xanax 0/25 mg #30  1 po tid prn

## 2014-11-26 ENCOUNTER — Ambulatory Visit (INDEPENDENT_AMBULATORY_CARE_PROVIDER_SITE_OTHER): Payer: Medicare Other | Admitting: Internal Medicine

## 2014-11-26 ENCOUNTER — Encounter: Payer: Self-pay | Admitting: Internal Medicine

## 2014-11-26 VITALS — BP 116/64 | HR 85 | Temp 98.5°F | Ht 62.5 in | Wt 131.1 lb

## 2014-11-26 DIAGNOSIS — R1031 Right lower quadrant pain: Secondary | ICD-10-CM

## 2014-11-26 NOTE — Progress Notes (Signed)
Subjective:    Patient ID: Virginia Davidson, female    DOB: 05-12-1916, 81 y.o.   MRN: [id number]  DOS:  11/26/2014 Type of visit - description : Acute visit, here with her care giver  (an RN) and the daughter-in-law Interval history: The patient has been complaining of right-sided, lower abdominal pain since Monday, on and off, described as a soreness. Caregiver have not noticed any bruises. Had a fall and had a CT at the ER 09/26/2014, report reviewed. The family is concerned about appendicitis.    Review of Systems No fever chills Appetite is okay, good by mouth tolerance No nausea vomiting. BMs regular, last BM yesterday. No rash. Has not reported dysuria or gross hematuria. Urine has certain odor which is not a rare occurrence.   Past Medical History  Diagnosis Date  . Hypertension   . Thyroid disease   . Dementia   . Abdominal aneurysm   . Diverticulitis   . Glaucoma   . Urinary incontinence     Past Surgical History  Procedure Laterality Date  . Bladder surgery N/A     x2  . Hip surgery Right     Social History   Social History  . Marital Status: Widowed    Spouse Name: N/A  . Number of Children: N/A  . Years of Education: N/A   Occupational History  . Not on file.   Social History Main Topics  . Smoking status: Never Smoker   . Smokeless tobacco: Never Used  . Alcohol Use: No  . Drug Use: No  . Sexual Activity: Not Currently   Other Topics Concern  . Not on file   Social History Narrative        Medication List       This list is accurate as of: 11/26/14 11:59 PM.  Always use your most recent med list.               ALPRAZolam 0.25 MG tablet  Commonly known as:  XANAX  Take 1 tablet (0.25 mg total) by mouth 3 (three) times daily as needed for anxiety.     Calcium Carbonate-Vitamin D 600-200 MG-UNIT Caps  Take 1 tablet by mouth 2 (two) times daily.     levothyroxine 88 MCG tablet  Commonly known as:  SYNTHROID, LEVOTHROID  Take  1 tablet (88 mcg total) by mouth daily.     lisinopril 10 MG tablet  Commonly known as:  PRINIVIL,ZESTRIL  Take 1 tablet (10 mg total) by mouth daily.     NAMENDA XR 14 MG Cp24 24 hr capsule  Generic drug:  memantine  Take 1 capsule (14 mg total) by mouth daily.     nitroGLYCERIN 0.4 MG SL tablet  Commonly known as:  NITROSTAT  Place 0.4 mg under the tongue every 5 (five) minutes as needed for chest pain.     ranitidine 300 MG tablet  Commonly known as:  ZANTAC  Take 1 tablet (300 mg total) by mouth at bedtime.     tiZANidine 4 MG tablet  Commonly known as:  ZANAFLEX  Take 1 tablet (4 mg total) by mouth every 8 (eight) hours as needed for muscle spasms.           Objective:   Physical Exam BP 116/64 mmHg  Pulse 85  Temp(Src) 98.5 F (36.9 C) (Oral)  Ht 5' 2.5" (1.588 m)  Wt 131 lb 2 oz (59.478 kg)  BMI 23.59 kg/m2  SpO2 98% General:   Well  developed, sitting in a wheelchair  HEENT:  Normocephalic . Face symmetric, atraumatic Lungs:  CTA B Normal respiratory effort, no intercostal retractions, no accessory muscle use. Heart: RRR,  no murmur.  no pretibial edema bilaterally  Abdomen:  Not distended, soft, non-tender. No rebound or rigidity. Good bowel sounds. On the right suprapubic area there is a hernia, reducible, no TTP. Skin: Not pale. Not jaundice Neurologic:  alert & oriented X3.  Speech normal. Gait not tested but we held her transfer to the examining table, she favored the left leg.  Psych-- recently demented, cooperative. No anxious or depressed appearing.    Assessment & Plan:    Abdominal pain: 81 year old lady with history of gallbladder stones, an abdominal aneurysm, inguinal hernia, dementia >>>>presents after she complained of right lower abdominal pain. Family is concerned about appendicitis.  Exam is benign, she has a reducible, nontender hernia. Symptoms are unlikely to be from the aortic aneurysm. A gallbladder problem or appendicitis  in the elderly may present  in a very subtle way and that remains in the differential.  A right hip pain also may present this way in the elderly, the daughter-in-law reports that she had an x-ray recently with no acute findings. I talked to the family that the only way to be sure this is not early appendicitis is to do a CAT scan but alternatively we could keep her in observation by the family, check a UA, BMP and CBC. I think that is a reasonable approach consequently we agreed to do that. See  instructions.  Addendum: After the blood was drawn, she again complained of pain this time at the upper bilateral abdomen. I reexamined the patient, abdomen remained soft,  no distress. We agreed to stick with our original plan.  Today, I spent more than 40 minutes with the patient and her family:    : >50% of the time counseling regards the differential diagnosis, the pros and cons of doing a CAT scan versus observation Also: -reviewing the chart and labs ordered by other providers     CT 09/26/2014 after a fall: 1. No evidence of traumatic injury to the abdomen or pelvis. 2. New herniation of a short segment of distal ileum into a right inguinal hernia, without evidence for obstruction or strangulation. 3. Stable dilatation of the common bile duct to 1.3 cm, similar in appearance to the prior study and possibly reflecting the patient's baseline. 4. Cholelithiasis; gallbladder otherwise unremarkable. 5. Infrarenal abdominal aortic aneurysm measures 5.4 cm in AP dimension and 5.1 cm in transverse dimension, slightly increased in size from the prior study. The lumen is not well assessed without contrast, but appears grossly stable. Underlying diffuse calcification along the abdominal aorta and its branches. Recommend followup by abdomen and pelvis CTA in 3-6 months, and vascular surgery referral/consultation if not already obtained. This recommendation follows ACR consensus guidelines: White  Paper of the ACR Incidental Findings Committee II on Vascular Findings. J Am Coll Radiol 2013; 10:789-794. 6. Relatively large decompressed hiatal hernia seen. 7. Diffuse coronary artery calcifications seen. 8. Scattered diverticulosis noted along the splenic flexure of the colon, and along the descending and sigmoid colon, without evidence of diverticulitis. 9. Prominent bilateral renal parapelvic cysts noted. 10. Mild bibasilar atelectasis seen. 11. Mild diffuse degenerative change along the lumbar spine, with mild chronic compression deformity at vertebral body L2.

## 2014-11-26 NOTE — Patient Instructions (Signed)
Get your blood work before you leave    We also need a urine sample  Please call if: Severe or persistent pain. Decreased appetite, fever, chills, nausea, constipation. Also if not improving in the next few days

## 2014-11-26 NOTE — Progress Notes (Signed)
Pre visit review using our clinic review tool, if applicable. No additional management support is needed unless otherwise documented below in the visit note. 

## 2014-11-27 LAB — CBC WITH DIFFERENTIAL/PLATELET
BASOS ABS: 0.1 10*3/uL (ref 0.0–0.1)
Basophils Relative: 1.7 % (ref 0.0–3.0)
Eosinophils Absolute: 0.2 10*3/uL (ref 0.0–0.7)
Eosinophils Relative: 3.1 % (ref 0.0–5.0)
HCT: 46.3 % — ABNORMAL HIGH (ref 36.0–46.0)
Hemoglobin: 15.3 g/dL — ABNORMAL HIGH (ref 12.0–15.0)
LYMPHS ABS: 1.2 10*3/uL (ref 0.7–4.0)
Lymphocytes Relative: 15.9 % (ref 12.0–46.0)
MCHC: 33 g/dL (ref 30.0–36.0)
MCV: 91.4 fl (ref 78.0–100.0)
MONO ABS: 1 10*3/uL (ref 0.1–1.0)
Monocytes Relative: 12.4 % — ABNORMAL HIGH (ref 3.0–12.0)
NEUTROS ABS: 5.2 10*3/uL (ref 1.4–7.7)
Neutrophils Relative %: 66.9 % (ref 43.0–77.0)
PLATELETS: 232 10*3/uL (ref 150.0–400.0)
RBC: 5.06 Mil/uL (ref 3.87–5.11)
RDW: 14.3 % (ref 11.5–15.5)
WBC: 7.7 10*3/uL (ref 4.0–10.5)

## 2014-11-27 LAB — BASIC METABOLIC PANEL
BUN: 16 mg/dL (ref 6–23)
CO2: 29 meq/L (ref 19–32)
Calcium: 9.9 mg/dL (ref 8.4–10.5)
Chloride: 102 mEq/L (ref 96–112)
Creatinine, Ser: 0.95 mg/dL (ref 0.40–1.20)
GFR: 57.9 mL/min — ABNORMAL LOW (ref >60.00)
GLUCOSE: 111 mg/dL — AB (ref 70–99)
Potassium: 4.1 mEq/L (ref 3.5–5.1)
Sodium: 138 mEq/L (ref 135–145)

## 2014-11-27 LAB — URINALYSIS, ROUTINE W REFLEX MICROSCOPIC
Bilirubin Urine: NEGATIVE
Ketones, ur: NEGATIVE
NITRITE: POSITIVE — AB
SPECIFIC GRAVITY, URINE: 1.02 (ref 1.000–1.030)
Total Protein, Urine: 30 — AB
URINE GLUCOSE: NEGATIVE
Urobilinogen, UA: 0.2 (ref 0.0–1.0)
pH: 6.5 (ref 5.0–8.0)

## 2014-11-28 MED ORDER — CIPROFLOXACIN HCL 250 MG PO TABS: 250.0000 mg | ORAL_TABLET | Freq: Two times a day (BID) | ORAL | Status: DC

## 2014-11-28 NOTE — Addendum Note (Signed)
Addended by: Wilfrid Lund on: 11/28/2014 04:59 PM   Modules accepted: Orders

## 2014-11-29 LAB — URINE CULTURE: Colony Count: >=100000

## 2014-12-22 ENCOUNTER — Other Ambulatory Visit: Payer: Self-pay | Admitting: Family Medicine

## 2014-12-22 NOTE — Telephone Encounter (Signed)
Rx printed and signed by Dr. Etter Sjogren.  Rx sent to preferred pharmacy at [phone].  Fax confirmation received.

## 2014-12-22 NOTE — Telephone Encounter (Signed)
Requesting: ALPRAZolam (XANAX) 0.25 MG tablet Contract: no UDS: no  Last OV: 10/02/2014 (has an appt with you 01/05/2015) Last Refill: 10/31/2014 #30 no refills  Please Advise

## 2015-01-05 ENCOUNTER — Encounter: Payer: Self-pay | Admitting: Family Medicine

## 2015-01-05 ENCOUNTER — Ambulatory Visit (INDEPENDENT_AMBULATORY_CARE_PROVIDER_SITE_OTHER): Payer: Medicare Other | Admitting: Family Medicine

## 2015-01-05 VITALS — BP 126/74 | HR 79 | Temp 97.9°F | Ht 63.0 in | Wt 128.8 lb

## 2015-01-05 DIAGNOSIS — Z0000 Encounter for general adult medical examination without abnormal findings: Secondary | ICD-10-CM

## 2015-01-05 DIAGNOSIS — Z23 Encounter for immunization: Secondary | ICD-10-CM | POA: Diagnosis not present

## 2015-01-05 NOTE — Progress Notes (Signed)
Subjective:   Virginia Davidson is a 81 y.o. female who presents for Medicare Annual (Subsequent) preventive examination.  Review of Systems:   Review of Systems  Constitutional: Negative for activity change, appetite change and fatigue.  HENT: Negative for hearing loss, congestion, tinnitus and ear discharge.   Eyes: Negative for visual disturbance (see optho q1y -- vision corrected to 20/20 with glasses).  Respiratory: Negative for cough, chest tightness and shortness of breath.   Cardiovascular: Negative for chest pain, palpitations and leg swelling.  Gastrointestinal: Negative for abdominal pain, diarrhea, constipation and abdominal distention.  Genitourinary: Negative for urgency, frequency, decreased urine volume and difficulty urinating.  Musculoskeletal: Negative for back pain, arthralgias and gait problem.  Skin: Negative for color change, pallor and rash.  Neurological: Negative for dizziness, light-headedness, numbness and headaches.  Hematological: Negative for adenopathy. Does not bruise/bleed easily.  Psychiatric/Behavioral: Negative for suicidal ideas, confusion, sleep disturbance, self-injury, dysphoric mood, decreased concentration and agitation.  Pt is able to read and write and can do all ADLs No risk for falling No abuse/ violence in home          Objective:     Vitals: BP 126/74 mmHg  Pulse 79  Temp(Src) 97.9 F (36.6 C) (Oral)  Ht 5\' 3"  (1.6 m)  Wt 128 lb 12.8 oz (58.423 kg)  BMI 22.82 kg/m2  SpO2 94% BP 126/74 mmHg  Pulse 79  Temp(Src) 97.9 F (36.6 C) (Oral)  Ht 5\' 3"  (1.6 m)  Wt 128 lb 12.8 oz (58.423 kg)  BMI 22.82 kg/m2  SpO2 94% General appearance: alert, cooperative, appears stated age and no distress Head: Normocephalic, without obvious abnormality, atraumatic Eyes: negative findings: lids and lashes normal and pupils equal, round, reactive to light and accomodation Ears: normal TM's and external ear canals both ears Nose: Nares normal.  Septum midline. Mucosa normal. No drainage or sinus tenderness. Throat: lips, mucosa, and tongue normal; teeth and gums normal Neck: no adenopathy, no carotid bruit, no JVD, supple, symmetrical, trachea midline and thyroid not enlarged, symmetric, no tenderness/mass/nodules Back: symmetric, no curvature. ROM normal. No CVA tenderness. Lungs: clear to auscultation bilaterally Breasts: normal appearance, no masses or tenderness Heart: S1, S2 normal Abdomen: soft, non-tender; bowel sounds normal; no masses,  no organomegaly Pelvic: not indicated; post-menopausal, no abnormal Pap smears in past Extremities: extremities normal, atraumatic, no cyanosis or edema Pulses: 2+ and symmetric Skin: Skin color, texture, turgor normal. No rashes or lesions Lymph nodes: Cervical, supraclavicular, and axillary nodes normal. Neurologic: Mental status: alertness: lethargic, orientation: person, affect: blunted Gait: Normal   Tobacco History  Smoking status  . Never Smoker   Smokeless tobacco  . Never Used     Counseling given: Not Answered   Past Medical History  Diagnosis Date  . Hypertension   . Thyroid disease   . Dementia   . Abdominal aneurysm ([place])   . Diverticulitis   . Glaucoma   . Urinary incontinence    Past Surgical History  Procedure Laterality Date  . Bladder surgery N/A     x2  . Hip surgery Right    Family History  Problem Relation Age of Onset  . Stroke Mother    History  Sexual Activity  . Sexual Activity: Not Currently    Outpatient Encounter Prescriptions as of 01/05/2015  Medication Sig  . ALPRAZolam (XANAX) 0.25 MG tablet TAKE ONE TABLET 3 TIMES A DAY AS NEEDED.  . Calcium Carbonate-Vitamin D 600-200 MG-UNIT CAPS Take 1 tablet by mouth  2 (two) times daily.  Marland Kitchen levothyroxine (SYNTHROID, LEVOTHROID) 88 MCG tablet Take 1 tablet (88 mcg total) by mouth daily.  Marland Kitchen lisinopril (PRINIVIL,ZESTRIL) 10 MG tablet Take 1 tablet (10 mg total) by mouth daily.  Marland Kitchen NAMENDA XR  14 MG CP24 24 hr capsule Take 1 capsule (14 mg total) by mouth daily.  . nitroGLYCERIN (NITROSTAT) 0.4 MG SL tablet Place 0.4 mg under the tongue every 5 (five) minutes as needed for chest pain.  . ranitidine (ZANTAC) 300 MG tablet Take 1 tablet (300 mg total) by mouth at bedtime.  . [DISCONTINUED] ciprofloxacin (CIPRO) 250 MG tablet Take 1 tablet (250 mg total) by mouth 2 (two) times daily.  . [DISCONTINUED] tiZANidine (ZANAFLEX) 4 MG tablet Take 1 tablet (4 mg total) by mouth every 8 (eight) hours as needed for muscle spasms. (Patient not taking: Reported on 11/26/2014)   No facility-administered encounter medications on file as of 01/05/2015.    Activities of Daily Living In your present state of health, do you have any difficulty performing the following activities: 01/05/2015 10/02/2014  Hearing? N N  Vision? N Y  Difficulty concentrating or making decisions? Tempie Donning  Walking or climbing stairs? Y Y  Dressing or bathing? N Y  Doing errands, shopping? Tempie Donning    Patient Care Team: Rosalita Chessman, DO as PCP - General (Family Medicine) Wynona Neat. Shana Chute, MD as Referring Physician (Gastroenterology) Debby Bud., MD (Surgery) Luellen Pucker, MD (Obstetrics and Gynecology) Christy Sartorius, MD as Referring Physician (Urology) Altamese Woodson, MD as Consulting Physician (Orthopedic Surgery) Marilynne Halsted, MD as Referring Physician (Ophthalmology)    Assessment:    cpe Exercise Activities and Dietary recommendations--- cont walking     Goals    None     Fall Risk Fall Risk  01/05/2015 10/02/2014  Falls in the past year? No Yes  Number falls in past yr: - 2 or more  Injury with Fall? - Yes  Risk Factor Category  - High Fall Risk  Risk for fall due to : - History of fall(s)   Depression Screen PHQ 2/9 Scores 01/05/2015 10/02/2014  PHQ - 2 Score 0 0     Cognitive Testing + dementia--- able to answer simple questions---- slept through most of appointment---- daughter answered  most questions  Immunization History  Administered Date(s) Administered  . Influenza, High Dose Seasonal PF 01/05/2015   Screening Tests Health Maintenance  Topic Date Due  . TETANUS/TDAP  04/21/1937  . ZOSTAVAX  04/21/1978  . DEXA SCAN  04/22/1983  . PNA vac Low Risk Adult (1 of 2 - PCV13) 04/22/1983  . INFLUENZA VACCINE  10/27/2014      Plan:    see avs  During the course of the visit the patient was educated and counseled about the following appropriate screening and preventive services:   Vaccines to include Pneumoccal, Influenza, Hepatitis B, Td, Zostavax, HCV  Electrocardiogram  Cardiovascular Disease  Colorectal cancer screening  Bone density screening  Diabetes screening  Glaucoma screening  Mammography/PAP  Nutrition counseling   Patient Instructions (the written plan) was given to the patient.  1. Medicare annual wellness visit, subsequent See above  2. Routine history and physical examination of adult    3. Need for prophylactic vaccination and inoculation against influenza   - Flu vaccine HIGH DOSE PF (Fluzone High dose)  Garnet Koyanagi, DO  01/05/2015

## 2015-01-05 NOTE — Patient Instructions (Signed)
Preventive Care for Adults, Female A healthy lifestyle and preventive care can promote health and wellness. Preventive health guidelines for women include the following key practices.  A routine yearly physical is a good way to check with your health care provider about your health and preventive screening. It is a chance to share any concerns and updates on your health and to receive a thorough exam.  Visit your dentist for a routine exam and preventive care every 6 months. Brush your teeth twice a day and floss once a day. Good oral hygiene prevents tooth decay and gum disease.  The frequency of eye exams is based on your age, health, family medical history, use of contact lenses, and other factors. Follow your health care provider's recommendations for frequency of eye exams.  Eat a healthy diet. Foods like vegetables, fruits, whole grains, low-fat dairy products, and lean protein foods contain the nutrients you need without too many calories. Decrease your intake of foods high in solid fats, added sugars, and salt. Eat the right amount of calories for you.Get information about a proper diet from your health care provider, if necessary.  Regular physical exercise is one of the most important things you can do for your health. Most adults should get at least 150 minutes of moderate-intensity exercise (any activity that increases your heart rate and causes you to sweat) each week. In addition, most adults need muscle-strengthening exercises on 2 or more days a week.  Maintain a healthy weight. The body mass index (BMI) is a screening tool to identify possible weight problems. It provides an estimate of body fat based on height and weight. Your health care provider can find your BMI and can help you achieve or maintain a healthy weight.For adults 20 years and older:  A BMI below 18.5 is considered underweight.  A BMI of 18.5 to 24.9 is normal.  A BMI of 25 to 29.9 is considered overweight.  A  BMI of 30 and above is considered obese.  Maintain normal blood lipids and cholesterol levels by exercising and minimizing your intake of saturated fat. Eat a balanced diet with plenty of fruit and vegetables. Blood tests for lipids and cholesterol should begin at age 45 and be repeated every 5 years. If your lipid or cholesterol levels are high, you are over 50, or you are at high risk for heart disease, you may need your cholesterol levels checked more frequently.Ongoing high lipid and cholesterol levels should be treated with medicines if diet and exercise are not working.  If you smoke, find out from your health care provider how to quit. If you do not use tobacco, do not start.  Lung cancer screening is recommended for adults aged 45-80 years who are at high risk for developing lung cancer because of a history of smoking. A yearly low-dose CT scan of the lungs is recommended for people who have at least a 30-pack-year history of smoking and are a current smoker or have quit within the past 15 years. A pack year of smoking is smoking an average of 1 pack of cigarettes a day for 1 year (for example: 1 pack a day for 30 years or 2 packs a day for 15 years). Yearly screening should continue until the smoker has stopped smoking for at least 15 years. Yearly screening should be stopped for people who develop a health problem that would prevent them from having lung cancer treatment.  If you are pregnant, do not drink alcohol. If you are  breastfeeding, be very cautious about drinking alcohol. If you are not pregnant and choose to drink alcohol, do not have more than 1 drink per day. One drink is considered to be 12 ounces (355 mL) of beer, 5 ounces (148 mL) of wine, or 1.5 ounces (44 mL) of liquor.  Avoid use of street drugs. Do not share needles with anyone. Ask for help if you need support or instructions about stopping the use of drugs.  High blood pressure causes heart disease and increases the risk  of stroke. Your blood pressure should be checked at least every 1 to 2 years. Ongoing high blood pressure should be treated with medicines if weight loss and exercise do not work.  If you are 55-79 years old, ask your health care provider if you should take aspirin to prevent strokes.  Diabetes screening is done by taking a blood sample to check your blood glucose level after you have not eaten for a certain period of time (fasting). If you are not overweight and you do not have risk factors for diabetes, you should be screened once every 3 years starting at age 45. If you are overweight or obese and you are 40-70 years of age, you should be screened for diabetes every year as part of your cardiovascular risk assessment.  Breast cancer screening is essential preventive care for women. You should practice "breast self-awareness." This means understanding the normal appearance and feel of your breasts and may include breast self-examination. Any changes detected, no matter how small, should be reported to a health care provider. Women in their 20s and 30s should have a clinical breast exam (CBE) by a health care provider as part of a regular health exam every 1 to 3 years. After age 40, women should have a CBE every year. Starting at age 40, women should consider having a mammogram (breast X-ray test) every year. Women who have a family history of breast cancer should talk to their health care provider about genetic screening. Women at a high risk of breast cancer should talk to their health care providers about having an MRI and a mammogram every year.  Breast cancer gene (BRCA)-related cancer risk assessment is recommended for women who have family members with BRCA-related cancers. BRCA-related cancers include breast, ovarian, tubal, and peritoneal cancers. Having family members with these cancers may be associated with an increased risk for harmful changes (mutations) in the breast cancer genes BRCA1 and  BRCA2. Results of the assessment will determine the need for genetic counseling and BRCA1 and BRCA2 testing.  Your health care provider may recommend that you be screened regularly for cancer of the pelvic organs (ovaries, uterus, and vagina). This screening involves a pelvic examination, including checking for microscopic changes to the surface of your cervix (Pap test). You may be encouraged to have this screening done every 3 years, beginning at age 21.  For women ages 30-65, health care providers may recommend pelvic exams and Pap testing every 3 years, or they may recommend the Pap and pelvic exam, combined with testing for human papilloma virus (HPV), every 5 years. Some types of HPV increase your risk of cervical cancer. Testing for HPV may also be done on women of any age with unclear Pap test results.  Other health care providers may not recommend any screening for nonpregnant women who are considered low risk for pelvic cancer and who do not have symptoms. Ask your health care provider if a screening pelvic exam is right for   you.  If you have had past treatment for cervical cancer or a condition that could lead to cancer, you need Pap tests and screening for cancer for at least 20 years after your treatment. If Pap tests have been discontinued, your risk factors (such as having a new sexual partner) need to be reassessed to determine if screening should resume. Some women have medical problems that increase the chance of getting cervical cancer. In these cases, your health care provider may recommend more frequent screening and Pap tests.  Colorectal cancer can be detected and often prevented. Most routine colorectal cancer screening begins at the age of 50 years and continues through age 75 years. However, your health care provider may recommend screening at an earlier age if you have risk factors for colon cancer. On a yearly basis, your health care provider may provide home test kits to check  for hidden blood in the stool. Use of a small camera at the end of a tube, to directly examine the colon (sigmoidoscopy or colonoscopy), can detect the earliest forms of colorectal cancer. Talk to your health care provider about this at age 50, when routine screening begins. Direct exam of the colon should be repeated every 5-10 years through age 75 years, unless early forms of precancerous polyps or small growths are found.  People who are at an increased risk for hepatitis B should be screened for this virus. You are considered at high risk for hepatitis B if:  You were born in a country where hepatitis B occurs often. Talk with your health care provider about which countries are considered high risk.  Your parents were born in a high-risk country and you have not received a shot to protect against hepatitis B (hepatitis B vaccine).  You have HIV or AIDS.  You use needles to inject street drugs.  You live with, or have sex with, someone who has hepatitis B.  You get hemodialysis treatment.  You take certain medicines for conditions like cancer, organ transplantation, and autoimmune conditions.  Hepatitis C blood testing is recommended for all people born from 1945 through 1965 and any individual with known risks for hepatitis C.  Practice safe sex. Use condoms and avoid high-risk sexual practices to reduce the spread of sexually transmitted infections (STIs). STIs include gonorrhea, chlamydia, syphilis, trichomonas, herpes, HPV, and human immunodeficiency virus (HIV). Herpes, HIV, and HPV are viral illnesses that have no cure. They can result in disability, cancer, and death.  You should be screened for sexually transmitted illnesses (STIs) including gonorrhea and chlamydia if:  You are sexually active and are younger than 24 years.  You are older than 24 years and your health care provider tells you that you are at risk for this type of infection.  Your sexual activity has changed  since you were last screened and you are at an increased risk for chlamydia or gonorrhea. Ask your health care provider if you are at risk.  If you are at risk of being infected with HIV, it is recommended that you take a prescription medicine daily to prevent HIV infection. This is called preexposure prophylaxis (PrEP). You are considered at risk if:  You are sexually active and do not regularly use condoms or know the HIV status of your partner(s).  You take drugs by injection.  You are sexually active with a partner who has HIV.  Talk with your health care provider about whether you are at high risk of being infected with HIV. If   you choose to begin PrEP, you should first be tested for HIV. You should then be tested every 3 months for as long as you are taking PrEP.  Osteoporosis is a disease in which the bones lose minerals and strength with aging. This can result in serious bone fractures or breaks. The risk of osteoporosis can be identified using a bone density scan. Women ages 67 years and over and women at risk for fractures or osteoporosis should discuss screening with their health care providers. Ask your health care provider whether you should take a calcium supplement or vitamin D to reduce the rate of osteoporosis.  Menopause can be associated with physical symptoms and risks. Hormone replacement therapy is available to decrease symptoms and risks. You should talk to your health care provider about whether hormone replacement therapy is right for you.  Use sunscreen. Apply sunscreen liberally and repeatedly throughout the day. You should seek shade when your shadow is shorter than you. Protect yourself by wearing long sleeves, pants, a wide-brimmed hat, and sunglasses year round, whenever you are outdoors.  Once a month, do a whole body skin exam, using a mirror to look at the skin on your back. Tell your health care provider of new moles, moles that have irregular borders, moles that  are larger than a pencil eraser, or moles that have changed in shape or color.  Stay current with required vaccines (immunizations).  Influenza vaccine. All adults should be immunized every year.  Tetanus, diphtheria, and acellular pertussis (Td, Tdap) vaccine. Pregnant women should receive 1 dose of Tdap vaccine during each pregnancy. The dose should be obtained regardless of the length of time since the last dose. Immunization is preferred during the 27th-36th week of gestation. An adult who has not previously received Tdap or who does not know her vaccine status should receive 1 dose of Tdap. This initial dose should be followed by tetanus and diphtheria toxoids (Td) booster doses every 10 years. Adults with an unknown or incomplete history of completing a 3-dose immunization series with Td-containing vaccines should begin or complete a primary immunization series including a Tdap dose. Adults should receive a Td booster every 10 years.  Varicella vaccine. An adult without evidence of immunity to varicella should receive 2 doses or a second dose if she has previously received 1 dose. Pregnant females who do not have evidence of immunity should receive the first dose after pregnancy. This first dose should be obtained before leaving the health care facility. The second dose should be obtained 4-8 weeks after the first dose.  Human papillomavirus (HPV) vaccine. Females aged 13-26 years who have not received the vaccine previously should obtain the 3-dose series. The vaccine is not recommended for use in pregnant females. However, pregnancy testing is not needed before receiving a dose. If a female is found to be pregnant after receiving a dose, no treatment is needed. In that case, the remaining doses should be delayed until after the pregnancy. Immunization is recommended for any person with an immunocompromised condition through the age of 61 years if she did not get any or all doses earlier. During the  3-dose series, the second dose should be obtained 4-8 weeks after the first dose. The third dose should be obtained 24 weeks after the first dose and 16 weeks after the second dose.  Zoster vaccine. One dose is recommended for adults aged 30 years or older unless certain conditions are present.  Measles, mumps, and rubella (MMR) vaccine. Adults born  before 1957 generally are considered immune to measles and mumps. Adults born in 1957 or later should have 1 or more doses of MMR vaccine unless there is a contraindication to the vaccine or there is laboratory evidence of immunity to each of the three diseases. A routine second dose of MMR vaccine should be obtained at least 28 days after the first dose for students attending postsecondary schools, health care workers, or international travelers. People who received inactivated measles vaccine or an unknown type of measles vaccine during 1963-1967 should receive 2 doses of MMR vaccine. People who received inactivated mumps vaccine or an unknown type of mumps vaccine before 1979 and are at high risk for mumps infection should consider immunization with 2 doses of MMR vaccine. For females of childbearing age, rubella immunity should be determined. If there is no evidence of immunity, females who are not pregnant should be vaccinated. If there is no evidence of immunity, females who are pregnant should delay immunization until after pregnancy. Unvaccinated health care workers born before 1957 who lack laboratory evidence of measles, mumps, or rubella immunity or laboratory confirmation of disease should consider measles and mumps immunization with 2 doses of MMR vaccine or rubella immunization with 1 dose of MMR vaccine.  Pneumococcal 13-valent conjugate (PCV13) vaccine. When indicated, a person who is uncertain of his immunization history and has no record of immunization should receive the PCV13 vaccine. All adults 65 years of age and older should receive this  vaccine. An adult aged 19 years or older who has certain medical conditions and has not been previously immunized should receive 1 dose of PCV13 vaccine. This PCV13 should be followed with a dose of pneumococcal polysaccharide (PPSV23) vaccine. Adults who are at high risk for pneumococcal disease should obtain the PPSV23 vaccine at least 8 weeks after the dose of PCV13 vaccine. Adults older than 79 years of age who have normal immune system function should obtain the PPSV23 vaccine dose at least 1 year after the dose of PCV13 vaccine.  Pneumococcal polysaccharide (PPSV23) vaccine. When PCV13 is also indicated, PCV13 should be obtained first. All adults aged 65 years and older should be immunized. An adult younger than age 65 years who has certain medical conditions should be immunized. Any person who resides in a nursing home or long-term care facility should be immunized. An adult smoker should be immunized. People with an immunocompromised condition and certain other conditions should receive both PCV13 and PPSV23 vaccines. People with human immunodeficiency virus (HIV) infection should be immunized as soon as possible after diagnosis. Immunization during chemotherapy or radiation therapy should be avoided. Routine use of PPSV23 vaccine is not recommended for American Indians, [place] Natives, or people younger than 65 years unless there are medical conditions that require PPSV23 vaccine. When indicated, people who have unknown immunization and have no record of immunization should receive PPSV23 vaccine. One-time revaccination 5 years after the first dose of PPSV23 is recommended for people aged 19-64 years who have chronic kidney failure, nephrotic syndrome, asplenia, or immunocompromised conditions. People who received 1-2 doses of PPSV23 before age 65 years should receive another dose of PPSV23 vaccine at age 65 years or later if at least 5 years have passed since the previous dose. Doses of PPSV23 are not  needed for people immunized with PPSV23 at or after age 65 years.  Meningococcal vaccine. Adults with asplenia or persistent complement component deficiencies should receive 2 doses of quadrivalent meningococcal conjugate (MenACWY-D) vaccine. The doses should be obtained   at least 2 months apart. Microbiologists working with certain meningococcal bacteria, [place] recruits, people at risk during an outbreak, and people who travel to or live in countries with a high rate of meningitis should be immunized. A first-year college student up through age 34 years who is living in a residence hall should receive a dose if she did not receive a dose on or after her 16th birthday. Adults who have certain high-risk conditions should receive one or more doses of vaccine.  Hepatitis A vaccine. Adults who wish to be protected from this disease, have certain high-risk conditions, work with hepatitis A-infected animals, work in hepatitis A research labs, or travel to or work in countries with a high rate of hepatitis A should be immunized. Adults who were previously unvaccinated and who anticipate close contact with an international adoptee during the first 60 days after arrival in the [place] from a country with a high rate of hepatitis A should be immunized.  Hepatitis B vaccine. Adults who wish to be protected from this disease, have certain high-risk conditions, may be exposed to blood or other infectious body fluids, are household contacts or sex partners of hepatitis B positive people, are clients or workers in certain care facilities, or travel to or work in countries with a high rate of hepatitis B should be immunized.  Haemophilus influenzae type b (Hib) vaccine. A previously unvaccinated person with asplenia or sickle cell disease or having a scheduled splenectomy should receive 1 dose of Hib vaccine. Regardless of previous immunization, a recipient of a hematopoietic stem cell transplant should receive a  3-dose series 6-12 months after her successful transplant. Hib vaccine is not recommended for adults with HIV infection. Preventive Services / Frequency Ages 35 to 4 years  Blood pressure check.** / Every 3-5 years.  Lipid and cholesterol check.** / Every 5 years beginning at age 60.  Clinical breast exam.** / Every 3 years for women in their 71s and 10s.  BRCA-related cancer risk assessment.** / For women who have family members with a BRCA-related cancer (breast, ovarian, tubal, or peritoneal cancers).  Pap test.** / Every 2 years from ages 76 through 26. Every 3 years starting at age 61 through age 76 or 93 with a history of 3 consecutive normal Pap tests.  HPV screening.** / Every 3 years from ages 37 through ages 60 to 51 with a history of 3 consecutive normal Pap tests.  Hepatitis C blood test.** / For any individual with known risks for hepatitis C.  Skin self-exam. / Monthly.  Influenza vaccine. / Every year.  Tetanus, diphtheria, and acellular pertussis (Tdap, Td) vaccine.** / Consult your health care provider. Pregnant women should receive 1 dose of Tdap vaccine during each pregnancy. 1 dose of Td every 10 years.  Varicella vaccine.** / Consult your health care provider. Pregnant females who do not have evidence of immunity should receive the first dose after pregnancy.  HPV vaccine. / 3 doses over 6 months, if 93 and younger. The vaccine is not recommended for use in pregnant females. However, pregnancy testing is not needed before receiving a dose.  Measles, mumps, rubella (MMR) vaccine.** / You need at least 1 dose of MMR if you were born in 1957 or later. You may also need a 2nd dose. For females of childbearing age, rubella immunity should be determined. If there is no evidence of immunity, females who are not pregnant should be vaccinated. If there is no evidence of immunity, females who are  pregnant should delay immunization until after pregnancy.  Pneumococcal  13-valent conjugate (PCV13) vaccine.** / Consult your health care provider.  Pneumococcal polysaccharide (PPSV23) vaccine.** / 1 to 2 doses if you smoke cigarettes or if you have certain conditions.  Meningococcal vaccine.** / 1 dose if you are age 68 to 8 years and a Market researcher living in a residence hall, or have one of several medical conditions, you need to get vaccinated against meningococcal disease. You may also need additional booster doses.  Hepatitis A vaccine.** / Consult your health care provider.  Hepatitis B vaccine.** / Consult your health care provider.  Haemophilus influenzae type b (Hib) vaccine.** / Consult your health care provider. Ages 7 to 53 years  Blood pressure check.** / Every year.  Lipid and cholesterol check.** / Every 5 years beginning at age 25 years.  Lung cancer screening. / Every year if you are aged 11-80 years and have a 30-pack-year history of smoking and currently smoke or have quit within the past 15 years. Yearly screening is stopped once you have quit smoking for at least 15 years or develop a health problem that would prevent you from having lung cancer treatment.  Clinical breast exam.** / Every year after age 48 years.  BRCA-related cancer risk assessment.** / For women who have family members with a BRCA-related cancer (breast, ovarian, tubal, or peritoneal cancers).  Mammogram.** / Every year beginning at age 41 years and continuing for as long as you are in good health. Consult with your health care provider.  Pap test.** / Every 3 years starting at age 65 years through age 37 or 70 years with a history of 3 consecutive normal Pap tests.  HPV screening.** / Every 3 years from ages 72 years through ages 60 to 40 years with a history of 3 consecutive normal Pap tests.  Fecal occult blood test (FOBT) of stool. / Every year beginning at age 21 years and continuing until age 5 years. You may not need to do this test if you get  a colonoscopy every 10 years.  Flexible sigmoidoscopy or colonoscopy.** / Every 5 years for a flexible sigmoidoscopy or every 10 years for a colonoscopy beginning at age 35 years and continuing until age 48 years.  Hepatitis C blood test.** / For all people born from [birth year] through 1965 and any individual with known risks for hepatitis C.  Skin self-exam. / Monthly.  Influenza vaccine. / Every year.  Tetanus, diphtheria, and acellular pertussis (Tdap/Td) vaccine.** / Consult your health care provider. Pregnant women should receive 1 dose of Tdap vaccine during each pregnancy. 1 dose of Td every 10 years.  Varicella vaccine.** / Consult your health care provider. Pregnant females who do not have evidence of immunity should receive the first dose after pregnancy.  Zoster vaccine.** / 1 dose for adults aged 30 years or older.  Measles, mumps, rubella (MMR) vaccine.** / You need at least 1 dose of MMR if you were born in 1957 or later. You may also need a second dose. For females of childbearing age, rubella immunity should be determined. If there is no evidence of immunity, females who are not pregnant should be vaccinated. If there is no evidence of immunity, females who are pregnant should delay immunization until after pregnancy.  Pneumococcal 13-valent conjugate (PCV13) vaccine.** / Consult your health care provider.  Pneumococcal polysaccharide (PPSV23) vaccine.** / 1 to 2 doses if you smoke cigarettes or if you have certain conditions.  Meningococcal vaccine.** /  Consult your health care provider.  Hepatitis A vaccine.** / Consult your health care provider.  Hepatitis B vaccine.** / Consult your health care provider.  Haemophilus influenzae type b (Hib) vaccine.** / Consult your health care provider. Ages 64 years and over  Blood pressure check.** / Every year.  Lipid and cholesterol check.** / Every 5 years beginning at age 23 years.  Lung cancer screening. / Every year if you  are aged 16-80 years and have a 30-pack-year history of smoking and currently smoke or have quit within the past 15 years. Yearly screening is stopped once you have quit smoking for at least 15 years or develop a health problem that would prevent you from having lung cancer treatment.  Clinical breast exam.** / Every year after age 74 years.  BRCA-related cancer risk assessment.** / For women who have family members with a BRCA-related cancer (breast, ovarian, tubal, or peritoneal cancers).  Mammogram.** / Every year beginning at age 44 years and continuing for as long as you are in good health. Consult with your health care provider.  Pap test.** / Every 3 years starting at age 58 years through age 22 or 39 years with 3 consecutive normal Pap tests. Testing can be stopped between 65 and 70 years with 3 consecutive normal Pap tests and no abnormal Pap or HPV tests in the past 10 years.  HPV screening.** / Every 3 years from ages 64 years through ages 70 or 61 years with a history of 3 consecutive normal Pap tests. Testing can be stopped between 65 and 70 years with 3 consecutive normal Pap tests and no abnormal Pap or HPV tests in the past 10 years.  Fecal occult blood test (FOBT) of stool. / Every year beginning at age 40 years and continuing until age 27 years. You may not need to do this test if you get a colonoscopy every 10 years.  Flexible sigmoidoscopy or colonoscopy.** / Every 5 years for a flexible sigmoidoscopy or every 10 years for a colonoscopy beginning at age 7 years and continuing until age 32 years.  Hepatitis C blood test.** / For all people born from [birth year] through 1963 and any individual with known risks for hepatitis C.  Osteoporosis screening.** / A one-time screening for women ages 30 years and over and women at risk for fractures or osteoporosis.  Skin self-exam. / Monthly.  Influenza vaccine. / Every year.  Tetanus, diphtheria, and acellular pertussis (Tdap/Td)  vaccine.** / 1 dose of Td every 10 years.  Varicella vaccine.** / Consult your health care provider.  Zoster vaccine.** / 1 dose for adults aged 35 years or older.  Pneumococcal 13-valent conjugate (PCV13) vaccine.** / Consult your health care provider.  Pneumococcal polysaccharide (PPSV23) vaccine.** / 1 dose for all adults aged 46 years and older.  Meningococcal vaccine.** / Consult your health care provider.  Hepatitis A vaccine.** / Consult your health care provider.  Hepatitis B vaccine.** / Consult your health care provider.  Haemophilus influenzae type b (Hib) vaccine.** / Consult your health care provider. ** Family history and personal history of risk and conditions may change your health care provider's recommendations.   This information is not intended to replace advice given to you by your health care provider. Make sure you discuss any questions you have with your health care provider.   Document Released: 05/10/2001 Document Revised: 04/04/2014 Document Reviewed: 08/09/2010 Elsevier Interactive Patient Education [employer].

## 2015-01-05 NOTE — Progress Notes (Signed)
Pre visit review using our clinic review tool, if applicable. No additional management support is needed unless otherwise documented below in the visit note. 

## 2015-03-19 DIAGNOSIS — R3981 Functional urinary incontinence: Secondary | ICD-10-CM | POA: Diagnosis not present

## 2015-03-19 DIAGNOSIS — N390 Urinary tract infection, site not specified: Secondary | ICD-10-CM | POA: Diagnosis not present

## 2015-03-19 DIAGNOSIS — R399 Unspecified symptoms and signs involving the genitourinary system: Secondary | ICD-10-CM | POA: Diagnosis not present

## 2015-04-13 ENCOUNTER — Encounter: Payer: Self-pay | Admitting: Family Medicine

## 2015-04-13 ENCOUNTER — Ambulatory Visit (INDEPENDENT_AMBULATORY_CARE_PROVIDER_SITE_OTHER): Payer: Medicare Other | Admitting: Family Medicine

## 2015-04-13 VITALS — BP 122/68 | HR 79 | Temp 98.0°F | Ht 62.0 in | Wt 127.2 lb

## 2015-04-13 DIAGNOSIS — G309 Alzheimer's disease, unspecified: Secondary | ICD-10-CM

## 2015-04-13 DIAGNOSIS — F0390 Unspecified dementia without behavioral disturbance: Secondary | ICD-10-CM

## 2015-04-13 DIAGNOSIS — R5383 Other fatigue: Secondary | ICD-10-CM | POA: Diagnosis not present

## 2015-04-13 DIAGNOSIS — R1013 Epigastric pain: Secondary | ICD-10-CM

## 2015-04-13 DIAGNOSIS — F0280 Dementia in other diseases classified elsewhere without behavioral disturbance: Secondary | ICD-10-CM | POA: Diagnosis not present

## 2015-04-13 LAB — CBC WITH DIFFERENTIAL/PLATELET
BASOS ABS: 0 10*3/uL (ref 0.0–0.1)
Basophils Relative: 0.5 % (ref 0.0–3.0)
EOS PCT: 1 % (ref 0.0–5.0)
Eosinophils Absolute: 0.1 10*3/uL (ref 0.0–0.7)
HEMATOCRIT: 42.3 % (ref 36.0–46.0)
Hemoglobin: 14 g/dL (ref 12.0–15.0)
LYMPHS PCT: 13.8 % (ref 12.0–46.0)
Lymphs Abs: 0.8 10*3/uL (ref 0.7–4.0)
MCHC: 33.1 g/dL (ref 30.0–36.0)
MCV: 92.2 fl (ref 78.0–100.0)
MONOS PCT: 16 % — AB (ref 3.0–12.0)
Monocytes Absolute: 0.9 10*3/uL (ref 0.1–1.0)
NEUTROS ABS: 3.7 10*3/uL (ref 1.4–7.7)
Neutrophils Relative %: 68.7 % (ref 43.0–77.0)
PLATELETS: 165 10*3/uL (ref 150.0–400.0)
RBC: 4.59 Mil/uL (ref 3.87–5.11)
RDW: 16.4 % — ABNORMAL HIGH (ref 11.5–15.5)
WBC: 5.4 10*3/uL (ref 4.0–10.5)

## 2015-04-13 LAB — TSH: TSH: 0.08 u[IU]/mL — ABNORMAL LOW (ref 0.35–4.50)

## 2015-04-13 LAB — COMPREHENSIVE METABOLIC PANEL
ALT: 7 U/L (ref 0–35)
AST: 15 U/L (ref 0–37)
Albumin: 3.1 g/dL — ABNORMAL LOW (ref 3.5–5.2)
Alkaline Phosphatase: 61 U/L (ref 39–117)
BILIRUBIN TOTAL: 0.5 mg/dL (ref 0.2–1.2)
BUN: 24 mg/dL — ABNORMAL HIGH (ref 6–23)
CALCIUM: 9.9 mg/dL (ref 8.4–10.5)
CO2: 27 mEq/L (ref 19–32)
Chloride: 106 mEq/L (ref 96–112)
Creatinine, Ser: 1.19 mg/dL (ref 0.40–1.20)
GFR: 44.61 mL/min — ABNORMAL LOW (ref >60.00)
Glucose, Bld: 95 mg/dL (ref 70–99)
Potassium: 4 mEq/L (ref 3.5–5.1)
Sodium: 141 mEq/L (ref 135–145)
Total Protein: 5.4 g/dL — ABNORMAL LOW (ref 6.0–8.3)

## 2015-04-13 MED ORDER — RANITIDINE HCL 150 MG/10ML PO SYRP: 150.0000 mg | ORAL_SOLUTION | Freq: Two times a day (BID) | ORAL | Status: DC

## 2015-04-13 MED ORDER — RANITIDINE HCL 150 MG PO TABS: 150.0000 mg | ORAL_TABLET | Freq: Two times a day (BID) | ORAL | Status: DC

## 2015-04-13 MED ORDER — MEMANTINE HCL ER 7 MG PO CP24: 7.0000 mg | ORAL_CAPSULE | Freq: Every day | ORAL | Status: DC

## 2015-04-13 NOTE — Progress Notes (Signed)
Patient ID: Zahriah Roes, female    DOB: 04-01-1916  Age: 82 y.o. MRN: [id number]    Subjective:  Subjective HPI Jamaria Amborn presents for f/u and c/o weight loss --- daughter in law is with her with one of the nurses that helps pt.  She has 24 hour care in her indep living facility.  She had 2 bouts of diarrha over last few days but it has stopped.  Review of Systems  Constitutional: Negative for diaphoresis, appetite change, fatigue and unexpected weight change.  Eyes: Negative for pain, redness and visual disturbance.  Respiratory: Negative for cough, chest tightness, shortness of breath and wheezing.   Cardiovascular: Negative for chest pain, palpitations and leg swelling.  Endocrine: Negative for cold intolerance, heat intolerance, polydipsia, polyphagia and polyuria.  Genitourinary: Negative for dysuria, frequency and difficulty urinating.  Neurological: Negative for dizziness, light-headedness, numbness and headaches.    History Past Medical History  Diagnosis Date  . Hypertension   . Thyroid disease   . Dementia   . Abdominal aneurysm ([place])   . Diverticulitis   . Glaucoma   . Urinary incontinence     She has past surgical history that includes Bladder surgery (N/A) and Hip surgery (Right).   Her family history includes Stroke in her mother.She reports that she has never smoked. She has never used smokeless tobacco. She reports that she does not drink alcohol or use illicit drugs.  Current Outpatient Prescriptions on File Prior to Visit  Medication Sig Dispense Refill  . ALPRAZolam (XANAX) 0.25 MG tablet TAKE ONE TABLET 3 TIMES A DAY AS NEEDED. 30 tablet 1  . Calcium Carbonate-Vitamin D 600-200 MG-UNIT CAPS Take 1 tablet by mouth 2 (two) times daily.    Marland Kitchen levothyroxine (SYNTHROID, LEVOTHROID) 88 MCG tablet Take 1 tablet (88 mcg total) by mouth daily. 90 tablet 1  . lisinopril (PRINIVIL,ZESTRIL) 10 MG tablet Take 1 tablet (10 mg total) by mouth daily. 90 tablet 1  .  nitroGLYCERIN (NITROSTAT) 0.4 MG SL tablet Place 0.4 mg under the tongue every 5 (five) minutes as needed for chest pain.     No current facility-administered medications on file prior to visit.     Objective:  Objective Physical Exam  Constitutional: She is oriented to person, place, and time. She appears well-developed and well-nourished.  HENT:  Head: Normocephalic and atraumatic.  Eyes: Conjunctivae and EOM are normal.  Neck: Normal range of motion. Neck supple. No JVD present. Carotid bruit is not present. No thyromegaly present.  Cardiovascular: Normal rate, regular rhythm and normal heart sounds.   No murmur heard. Pulmonary/Chest: Effort normal and breath sounds normal. No respiratory distress. She has no wheezes. She has no rales. She exhibits no tenderness.  Musculoskeletal: She exhibits no edema.  Neurological: She is alert and oriented to person, place, and time.  Psychiatric: She has a normal mood and affect. Cognition and memory are impaired.  Nursing note and vitals reviewed.  BP 122/68 mmHg  Pulse 79  Temp(Src) 98 F (36.7 C) (Oral)  Ht '5\' 2"'  (1.575 m)  Wt 127 lb 3.2 oz (57.698 kg)  BMI 23.26 kg/m2  SpO2 96% Wt Readings from Last 3 Encounters:  04/13/15 127 lb 3.2 oz (57.698 kg)  01/05/15 128 lb 12.8 oz (58.423 kg)  11/26/14 131 lb 2 oz (59.478 kg)     Lab Results  Component Value Date   WBC 5.4 04/13/2015   HGB 14.0 04/13/2015   HCT 42.3 04/13/2015   PLT 165.0 04/13/2015  GLUCOSE 95 04/13/2015   CHOL 145 10/02/2014   TRIG 78.0 10/02/2014   HDL 50.10 10/02/2014   LDLCALC 79 10/02/2014   ALT 7 04/13/2015   AST 15 04/13/2015   NA 141 04/13/2015   K 4.0 04/13/2015   CL 106 04/13/2015   CREATININE 1.19 04/13/2015   BUN 24* 04/13/2015   CO2 27 04/13/2015   TSH 0.08* 04/13/2015    Dg Thoracic Spine 2 View  10/02/2014  CLINICAL DATA:  Status post fall several days ago with persistent complaints of mid and lower back pain especially with movement CT  scan of the abdomen and pelvis which included the lower thoracic spine dated September 26, 2014 EXAM: THORACIC SPINE - 2-3 VIEWS COMPARISON:  None. FINDINGS: There is new partial compression of the body of T11. The loss of height anteriorly is approximately 20%. There is minimal posterior height loss. Elsewhere the thoracic vertebral levels appear preserved in height. There is no high-grade disc space narrowing. IMPRESSION: There is an acute partial compression of the body of T11 with loss of height anteriorly of 20%. Electronically Signed   By: David  Martinique M.D.   On: 10/02/2014 16:06   Dg Lumbar Spine Complete  10/02/2014  CLINICAL DATA:  Recent fall.  Back pain EXAM: LUMBAR SPINE - COMPLETE 4+ VIEW COMPARISON:  CT abdomen pelvis 09/26/2014 FINDINGS: Generalized osteopenia. Limited bony detail due to osteopenia and overlying bowel gas and stool. Calcified gallstones. Atherosclerotic aorta with abdominal aortic aneurysm. Mild to moderate compression fracture L2 is unchanged and appears chronic. No acute fracture. Moderate dextroscoliosis of the lumbar spine. Disc degeneration L4-5 and L5-S1. IMPRESSION: Chronic compression fracture L2.  No acute fracture Constipation Limited bony detail due to osteopenia and overlying bowel contents. Electronically Signed   By: Franchot Gallo M.D.   On: 10/02/2014 16:03     Assessment & Plan:  Plan I have discontinued Ms. Fricke's ranitidine and NAMENDA XR. I am also having her start on memantine and ranitidine. Additionally, I am having her maintain her Calcium Carbonate-Vitamin D, nitroGLYCERIN, levothyroxine, lisinopril, ALPRAZolam, and (Lactobacillus Rhamnosus, GG, (CULTURELLE PO)).  Meds ordered this encounter  Medications  . Lactobacillus Rhamnosus, GG, (CULTURELLE PO)    Sig: Take by mouth.  . memantine (NAMENDA XR) 7 MG CP24 24 hr capsule    Sig: Take 1 capsule (7 mg total) by mouth daily.    Dispense:  30 capsule    Refill:  2  . ranitidine (ZANTAC) 150 MG  tablet    Sig: Take 1 tablet (150 mg total) by mouth 2 (two) times daily.    Dispense:  30 tablet    Refill:  5    Problem List Items Addressed This Visit    None    Visit Diagnoses    Other fatigue    -  Primary    Relevant Orders    Comp Met (CMET) (Completed)    CBC with Differential/Platelet (Completed)    TSH (Completed)    POCT urinalysis dipstick    Alzheimer's dementia        Relevant Medications    memantine (NAMENDA XR) 7 MG CP24 24 hr capsule    Dyspepsia        Relevant Medications    ranitidine (ZANTAC) 150 MG tablet       Follow-up: Return if symptoms worsen or fail to improve.  Garnet Koyanagi, DO

## 2015-04-13 NOTE — Patient Instructions (Signed)

## 2015-04-13 NOTE — Progress Notes (Signed)
Pre visit review using our clinic review tool, if applicable. No additional management support is needed unless otherwise documented below in the visit note. 

## 2015-04-13 NOTE — Assessment & Plan Note (Signed)
Family interested in dec meds Dec namenda dose -- 1/2 change

## 2015-04-17 DIAGNOSIS — N390 Urinary tract infection, site not specified: Secondary | ICD-10-CM | POA: Diagnosis not present

## 2015-04-22 ENCOUNTER — Telehealth: Payer: Self-pay

## 2015-04-22 MED ORDER — LEVOTHYROXINE SODIUM 75 MCG PO TABS: 75.0000 ug | ORAL_TABLET | Freq: Every day | ORAL | Status: DC

## 2015-04-22 NOTE — Telephone Encounter (Signed)
Spoke with Diane and she verbalized understanding of the results. She stated she has tried the ensure and boost but it gives the patient diarrhea. She stated that patient has had diarrhea since 04/12/15 and it was mentioned at the visit but not discussed, she wanted to know what she could give her to help. (I scheduled her for Friday)  Please advise      KP

## 2015-04-22 NOTE — Telephone Encounter (Signed)
Thyroid could be causing it.  Can take otc meds for diarrhea like pepto or kaopectate

## 2015-04-22 NOTE — Telephone Encounter (Signed)
-----   Message from Rosalita Chessman, DO sent at 04/18/2015 12:34 PM EST ----- Decrease synthroid to 75 mcg , tsh too low Albumin low--- needs more protein as discussed in office--- boost, ensure etc

## 2015-04-24 ENCOUNTER — Ambulatory Visit: Payer: Medicare Other | Admitting: Family Medicine

## 2015-04-24 NOTE — Telephone Encounter (Signed)
I spoke with Virginia Davidson and the patient is feeling better and the diarrhea has stopped, she wanted to cancel the apt for today. Apt cancelled.    KP

## 2015-05-05 ENCOUNTER — Telehealth: Payer: Self-pay | Admitting: Family Medicine

## 2015-05-05 ENCOUNTER — Ambulatory Visit: Payer: PRIVATE HEALTH INSURANCE | Admitting: Family Medicine

## 2015-05-05 NOTE — Telephone Encounter (Signed)
No charge. 

## 2015-05-05 NOTE — Telephone Encounter (Signed)
Caller name: Diane   Relationship to patient: daughter   Reason for call: pt's daughter says that mom (pt ) isn't feeling well she says that she its to difficult to bring her out this morning so she needed to cancel pt's appt.

## 2015-05-05 NOTE — Telephone Encounter (Signed)
To MD for review     KP 

## 2015-05-14 ENCOUNTER — Other Ambulatory Visit: Payer: Self-pay | Admitting: Family Medicine

## 2015-05-23 ENCOUNTER — Emergency Department (HOSPITAL_BASED_OUTPATIENT_CLINIC_OR_DEPARTMENT_OTHER): Payer: Medicare Other

## 2015-05-23 ENCOUNTER — Inpatient Hospital Stay (HOSPITAL_BASED_OUTPATIENT_CLINIC_OR_DEPARTMENT_OTHER)
Admission: EM | Admit: 2015-05-23 | Discharge: 2015-05-28 | DRG: 871 | Disposition: A | Discharge status: Home / Self Care | Payer: Medicare Other | Attending: Internal Medicine | Admitting: Internal Medicine

## 2015-05-23 ENCOUNTER — Encounter (HOSPITAL_BASED_OUTPATIENT_CLINIC_OR_DEPARTMENT_OTHER): Payer: Self-pay | Admitting: Emergency Medicine

## 2015-05-23 DIAGNOSIS — Z87440 Personal history of urinary (tract) infections: Secondary | ICD-10-CM

## 2015-05-23 DIAGNOSIS — F0390 Unspecified dementia without behavioral disturbance: Secondary | ICD-10-CM | POA: Diagnosis not present

## 2015-05-23 DIAGNOSIS — J101 Influenza due to other identified influenza virus with other respiratory manifestations: Secondary | ICD-10-CM | POA: Diagnosis not present

## 2015-05-23 DIAGNOSIS — H409 Unspecified glaucoma: Secondary | ICD-10-CM | POA: Diagnosis present

## 2015-05-23 DIAGNOSIS — J9600 Acute respiratory failure, unspecified whether with hypoxia or hypercapnia: Secondary | ICD-10-CM | POA: Diagnosis present

## 2015-05-23 DIAGNOSIS — R509 Fever, unspecified: Secondary | ICD-10-CM | POA: Diagnosis not present

## 2015-05-23 DIAGNOSIS — N390 Urinary tract infection, site not specified: Secondary | ICD-10-CM | POA: Diagnosis present

## 2015-05-23 DIAGNOSIS — F419 Anxiety disorder, unspecified: Secondary | ICD-10-CM | POA: Diagnosis not present

## 2015-05-23 DIAGNOSIS — J9601 Acute respiratory failure with hypoxia: Secondary | ICD-10-CM | POA: Diagnosis not present

## 2015-05-23 DIAGNOSIS — E860 Dehydration: Secondary | ICD-10-CM | POA: Diagnosis present

## 2015-05-23 DIAGNOSIS — R0602 Shortness of breath: Secondary | ICD-10-CM | POA: Diagnosis present

## 2015-05-23 DIAGNOSIS — J1000 Influenza due to other identified influenza virus with unspecified type of pneumonia: Secondary | ICD-10-CM | POA: Diagnosis present

## 2015-05-23 DIAGNOSIS — I10 Essential (primary) hypertension: Secondary | ICD-10-CM | POA: Diagnosis not present

## 2015-05-23 DIAGNOSIS — K219 Gastro-esophageal reflux disease without esophagitis: Secondary | ICD-10-CM | POA: Diagnosis present

## 2015-05-23 DIAGNOSIS — Y95 Nosocomial condition: Secondary | ICD-10-CM | POA: Diagnosis present

## 2015-05-23 DIAGNOSIS — E039 Hypothyroidism, unspecified: Secondary | ICD-10-CM | POA: Diagnosis present

## 2015-05-23 DIAGNOSIS — G9340 Encephalopathy, unspecified: Secondary | ICD-10-CM | POA: Diagnosis present

## 2015-05-23 DIAGNOSIS — Z66 Do not resuscitate: Secondary | ICD-10-CM | POA: Diagnosis present

## 2015-05-23 DIAGNOSIS — J189 Pneumonia, unspecified organism: Secondary | ICD-10-CM | POA: Diagnosis present

## 2015-05-23 DIAGNOSIS — R339 Retention of urine, unspecified: Secondary | ICD-10-CM | POA: Diagnosis present

## 2015-05-23 DIAGNOSIS — A419 Sepsis, unspecified organism: Secondary | ICD-10-CM | POA: Diagnosis not present

## 2015-05-23 DIAGNOSIS — R05 Cough: Secondary | ICD-10-CM | POA: Diagnosis not present

## 2015-05-23 LAB — COMPREHENSIVE METABOLIC PANEL
ALBUMIN: 3.6 g/dL (ref 3.5–5.0)
ALT: 11 U/L — ABNORMAL LOW (ref 14–54)
ANION GAP: 9 (ref 5–15)
AST: 23 U/L (ref 15–41)
Alkaline Phosphatase: 65 U/L (ref 38–126)
BILIRUBIN TOTAL: 0.6 mg/dL (ref 0.3–1.2)
BUN: 19 mg/dL (ref 6–20)
CO2: 26 mmol/L (ref 22–32)
Calcium: 9.3 mg/dL (ref 8.9–10.3)
Chloride: 105 mmol/L (ref 101–111)
Creatinine, Ser: 1.02 mg/dL — ABNORMAL HIGH (ref 0.44–1.00)
GFR calc Af Amer: 52 mL/min — ABNORMAL LOW (ref >60)
GFR, EST NON AFRICAN AMERICAN: 45 mL/min — AB (ref >60)
Glucose, Bld: 150 mg/dL — ABNORMAL HIGH (ref 65–99)
POTASSIUM: 4 mmol/L (ref 3.5–5.1)
Sodium: 140 mmol/L (ref 135–145)
TOTAL PROTEIN: 6.4 g/dL — AB (ref 6.5–8.1)

## 2015-05-23 LAB — URINALYSIS, ROUTINE W REFLEX MICROSCOPIC
Bilirubin Urine: NEGATIVE
GLUCOSE, UA: NEGATIVE mg/dL
Ketones, ur: NEGATIVE mg/dL
Nitrite: NEGATIVE
PH: 5.5 (ref 5.0–8.0)
PROTEIN: 30 mg/dL — AB
Specific Gravity, Urine: 1.016 (ref 1.005–1.030)

## 2015-05-23 LAB — CBC WITH DIFFERENTIAL/PLATELET
BASOS ABS: 0 10*3/uL (ref 0.0–0.1)
Basophils Relative: 0 %
Eosinophils Absolute: 0 10*3/uL (ref 0.0–0.7)
Eosinophils Relative: 0 %
HEMATOCRIT: 46.9 % — AB (ref 36.0–46.0)
Hemoglobin: 15.6 g/dL — ABNORMAL HIGH (ref 12.0–15.0)
LYMPHS PCT: 7 %
Lymphs Abs: 0.4 10*3/uL — ABNORMAL LOW (ref 0.7–4.0)
MCH: 31.1 pg (ref 26.0–34.0)
MCHC: 33.3 g/dL (ref 30.0–36.0)
MCV: 93.6 fL (ref 78.0–100.0)
Monocytes Absolute: 0.8 10*3/uL (ref 0.1–1.0)
Monocytes Relative: 13 %
NEUTROS ABS: 4.9 10*3/uL (ref 1.7–7.7)
NEUTROS PCT: 80 %
Platelets: 150 10*3/uL (ref 150–400)
RBC: 5.01 MIL/uL (ref 3.87–5.11)
RDW: 15.4 % (ref 11.5–15.5)
WBC: 6.2 10*3/uL (ref 4.0–10.5)

## 2015-05-23 LAB — I-STAT VENOUS BLOOD GAS, ED
Acid-Base Excess: 2 mmol/L (ref 0.0–2.0)
BICARBONATE: 25.7 meq/L — AB (ref 20.0–24.0)
O2 Saturation: 66 %
TCO2: 27 mmol/L (ref 0–100)
pCO2, Ven: 42 mmHg — ABNORMAL LOW (ref 45.0–50.0)
pH, Ven: 7.403 — ABNORMAL HIGH (ref 7.250–7.300)
pO2, Ven: 38 mmHg (ref 30.0–45.0)

## 2015-05-23 LAB — I-STAT CG4 LACTIC ACID, ED: LACTIC ACID, VENOUS: 1.2 mmol/L (ref 0.5–2.0)

## 2015-05-23 LAB — PROTIME-INR
INR: 1.21 (ref 0.00–1.49)
PROTHROMBIN TIME: 15 s (ref 11.6–15.2)

## 2015-05-23 LAB — URINE MICROSCOPIC-ADD ON

## 2015-05-23 LAB — APTT: aPTT: 31 seconds (ref 24–37)

## 2015-05-23 MED ORDER — CEFEPIME HCL 1 G IJ SOLR
INTRAMUSCULAR | Status: AC
  Filled 2015-05-23: qty 1

## 2015-05-23 MED ORDER — SODIUM CHLORIDE 0.9 % IV BOLUS (SEPSIS)
1000.0000 mL | Freq: Once | INTRAVENOUS | Status: AC
  Administered 2015-05-23: 1000 mL via INTRAVENOUS

## 2015-05-23 MED ORDER — SODIUM CHLORIDE 0.9 % IV SOLN
INTRAVENOUS | Status: DC
  Administered 2015-05-23: via INTRAVENOUS
  Administered 2015-05-24: 75 mL/h via INTRAVENOUS
  Administered 2015-05-25 – 2015-05-27 (×3): via INTRAVENOUS

## 2015-05-23 MED ORDER — SODIUM CHLORIDE 0.9 % IV BOLUS (SEPSIS)
500.0000 mL | Freq: Once | INTRAVENOUS | Status: AC
  Administered 2015-05-23: 500 mL via INTRAVENOUS

## 2015-05-23 MED ORDER — DEXTROSE 5 % IV SOLN
1.0000 g | Freq: Once | INTRAVENOUS | Status: AC
  Administered 2015-05-23: 1 g via INTRAVENOUS

## 2015-05-23 MED ORDER — NITROGLYCERIN 0.4 MG SL SUBL: 0.4000 mg | SUBLINGUAL_TABLET | SUBLINGUAL | Status: DC | PRN

## 2015-05-23 MED ORDER — LEVOTHYROXINE SODIUM 75 MCG PO TABS
75.0000 ug | ORAL_TABLET | Freq: Every day | ORAL | Status: DC
  Filled 2015-05-23: qty 1

## 2015-05-23 MED ORDER — CULTURELLE PO CAPS: 1.0000 | ORAL_CAPSULE | Freq: Every day | ORAL | Status: DC

## 2015-05-23 MED ORDER — ENOXAPARIN SODIUM 40 MG/0.4ML ~~LOC~~ SOLN
40.0000 mg | Freq: Every day | SUBCUTANEOUS | Status: DC
  Administered 2015-05-23: 40 mg via SUBCUTANEOUS
  Filled 2015-05-23: qty 0.4

## 2015-05-23 MED ORDER — RISAQUAD PO CAPS
1.0000 | ORAL_CAPSULE | Freq: Every day | ORAL | Status: DC
  Administered 2015-05-24 – 2015-05-28 (×5): 1 via ORAL
  Filled 2015-05-23 (×5): qty 1

## 2015-05-23 MED ORDER — LORATADINE 10 MG PO TABS
10.0000 mg | ORAL_TABLET | Freq: Every day | ORAL | Status: DC
  Administered 2015-05-24 – 2015-05-28 (×5): 10 mg via ORAL
  Filled 2015-05-23 (×5): qty 1

## 2015-05-23 MED ORDER — VANCOMYCIN HCL IN DEXTROSE 1-5 GM/200ML-% IV SOLN
1000.0000 mg | Freq: Once | INTRAVENOUS | Status: AC
  Administered 2015-05-23: 1000 mg via INTRAVENOUS
  Filled 2015-05-23: qty 200

## 2015-05-23 MED ORDER — CALCIUM CARBONATE-VITAMIN D 500-200 MG-UNIT PO TABS
1.0000 | ORAL_TABLET | Freq: Two times a day (BID) | ORAL | Status: DC
  Administered 2015-05-24 – 2015-05-28 (×8): 1 via ORAL
  Filled 2015-05-23 (×12): qty 1

## 2015-05-23 MED ORDER — FAMOTIDINE 10 MG PO TABS
10.0000 mg | ORAL_TABLET | Freq: Every day | ORAL | Status: DC
Start: 2015-05-23 — End: 2015-05-28
  Administered 2015-05-23 – 2015-05-28 (×6): 10 mg via ORAL
  Filled 2015-05-23 (×6): qty 1

## 2015-05-23 MED ORDER — MEMANTINE HCL ER 7 MG PO CP24
7.0000 mg | ORAL_CAPSULE | Freq: Every day | ORAL | Status: DC
  Administered 2015-05-24 – 2015-05-28 (×5): 7 mg via ORAL
  Filled 2015-05-23 (×5): qty 1

## 2015-05-23 MED ORDER — CALCIUM CARBONATE-VITAMIN D 600-200 MG-UNIT PO CAPS: 1.0000 | ORAL_CAPSULE | Freq: Two times a day (BID) | ORAL | Status: DC

## 2015-05-23 MED ORDER — VANCOMYCIN HCL 500 MG IV SOLR: 500.0000 mg | INTRAVENOUS | Status: DC

## 2015-05-23 MED ORDER — ACETAMINOPHEN 160 MG/5ML PO SOLN
650.0000 mg | Freq: Once | ORAL | Status: AC
  Administered 2015-05-23: 650 mg via ORAL
  Filled 2015-05-23: qty 20.3

## 2015-05-23 MED ORDER — DEXTROSE 5 % IV SOLN
1.0000 g | Freq: Three times a day (TID) | INTRAVENOUS | Status: DC
  Administered 2015-05-24 – 2015-05-26 (×5): 1 g via INTRAVENOUS
  Filled 2015-05-23 (×7): qty 1

## 2015-05-23 MED ORDER — DM-GUAIFENESIN ER 30-600 MG PO TB12
1.0000 | ORAL_TABLET | Freq: Two times a day (BID) | ORAL | Status: DC
  Administered 2015-05-23 – 2015-05-28 (×10): 1 via ORAL
  Filled 2015-05-23 (×11): qty 1

## 2015-05-23 MED ORDER — HYDRALAZINE HCL 20 MG/ML IJ SOLN
5.0000 mg | INTRAMUSCULAR | Status: DC | PRN
  Administered 2015-05-25: 5 mg via INTRAVENOUS
  Filled 2015-05-23 (×2): qty 1

## 2015-05-23 MED ORDER — ALPRAZOLAM 0.25 MG PO TABS
0.2500 mg | ORAL_TABLET | Freq: Three times a day (TID) | ORAL | Status: DC | PRN
  Administered 2015-05-23 – 2015-05-25 (×4): 0.25 mg via ORAL
  Filled 2015-05-23 (×5): qty 1

## 2015-05-23 NOTE — ED Notes (Signed)
carelink here at [facility], family updated, preparing to transport, no changes, VSS/improved.

## 2015-05-23 NOTE — Progress Notes (Signed)
Pharmacy Antibiotic Note  Virginia Davidson is a 82 y.o. female admitted on 05/23/2015 with pneumonia.  Pharmacy has been consulted for vancomycin dosing. Tmax is 102.7 and WBC is WNL. Scr is WNL at 1.02. Lactic acid is 1.2. Patient transferred from [facility] to [facility].  Patient received initial Cefepime x 1 dose at [facility] and upon [facility] admission, pharmacy consulted to dose Aztreonam for HCAP.  Plan: - Continue Vancomycin  500mg  IV Q24h - F/u renal fxn, C&S, clinical status and trough at SS -Aztreonam 1gm IV q8h (will start 2/26 @ 19:00, as Cefepime dose received today @ 18:58 will provide coverage x 24 hrs)  Height: 5\' 4"  (162.6 cm) Weight: 119 lb 0.8 oz (54 kg) IBW/kg (Calculated) : 54.7  Temp (24hrs), Avg:100.7 F (38.2 C), Min:98.7 F (37.1 C), Max:102.7 F (39.3 C)   Recent Labs Lab 05/23/15 1820 05/23/15 1821  WBC 6.2  --   CREATININE 1.02*  --   LATICACIDVEN  --  1.20    Estimated Creatinine Clearance: 26.9 mL/min (by C-G formula based on Cr of 1.02).    Allergies  Allergen Reactions  . Penicillins Rash    Family is not sure of reaction as far as breathing issues are concerned  . Sulfamethoxazole Rash  . Alendronate Sodium     Unknown Reaction per caregiver  . Tetracyclines & Related     Unknown Reaction per caregiver    Antimicrobials this admission: 2/25 Vanc >> 2/25 Cefepime x 1  2/26 Aztreonam  Dose adjustments this admission:   Microbiology results: 2/25 Sputum: 2/25 Resp virus panel: 2/25 Urine: 2/25 Blood:   Thank you for allowing pharmacy to be a part of this patient's care.  Everette Rank, PharmD 05/23/2015 11:32 PM

## 2015-05-23 NOTE — ED Notes (Signed)
PT HIGH FALL RISK, ARM BAND PLACED ON ARM, SIDERAILS X 2 UP, FAMILY AT SIDE, DOOR TO ROOM OPEN FOR CONT OBSERVATION, FAMILY TEACHING DONE

## 2015-05-23 NOTE — ED Notes (Signed)
Transfer consent signed, Dr. Ralene Bathe in to update pt/family, IVF bolus continues, antibiotics complete. No changes. Pt resting, NAD, calm, resps e/u, VSS/ improved.

## 2015-05-23 NOTE — ED Provider Notes (Signed)
CSN: [id number]     Arrival date & time 05/23/15  1709 History  By signing my name below, I, Altamease Oiler, attest that this documentation has been prepared under the direction and in the presence of Quintella Reichert, MD. Electronically Signed: Altamease Oiler, ED Scribe. 05/23/2015. 6:05 PM   Chief Complaint  Patient presents with  . Code Sepsis   . Nasal Congestion   Level V caveat due to dementia   The history is provided by a relative. No language interpreter was used.   Virginia Davidson is a 82 y.o. female with history of dementia and HTN who presents to the Emergency Department with her son and daughter-in-law complaining of a cough with onset 3 days ago. Associated symptoms include watering eyes, complaint of chest pain 1 hour ago (per daughter-in-law), and generalized weakness. Daughter-in-law denies fever, vomiting, diarrhea. Pt denies chest pain, abdominal pain, and SOB. At baseline the pt feeds herself, answers questions, and ambulates with a walker. She lives at an assisted living facility where she has 24 hour care. Pt is not currently on an abx but has history of recurrent UTIs and finished a course of Cipro a few weeks ago. Daughter- in-law states that some of her caregivers have been sick.    Past Medical History  Diagnosis Date  . Hypertension   . Thyroid disease   . Dementia   . Abdominal aneurysm ([place])   . Diverticulitis   . Glaucoma   . Urinary incontinence    Past Surgical History  Procedure Laterality Date  . Bladder surgery N/A     x2  . Hip surgery Right    Family History  Problem Relation Age of Onset  . Stroke Mother    Social History  Substance Use Topics  . Smoking status: Never Smoker   . Smokeless tobacco: Never Used  . Alcohol Use: No   OB History    No data available     Review of Systems  Unable to perform ROS: Dementia   Allergies  Penicillins; Sulfamethoxazole; Alendronate sodium; and Tetracyclines & related  Home Medications    Prior to Admission medications   Medication Sig Start Date End Date Taking? Authorizing Provider  ALPRAZolam (XANAX) 0.25 MG tablet TAKE ONE TABLET 3 TIMES A DAY AS NEEDED. Patient taking differently: TAKE ONE TABLET 3 TIMES A DAY AS NEEDED FOR ANXIETY 12/22/14  Yes Alferd Apa Lowne, DO  Calcium Carbonate-Vitamin D 600-200 MG-UNIT CAPS Take 1 tablet by mouth 2 (two) times daily.   Yes Historical Provider, MD  dextromethorphan (DELSYM) 30 MG/5ML liquid Take 30 mg by mouth as needed for cough.   Yes Historical Provider, MD  fexofenadine (ALLEGRA) 180 MG tablet Take 180 mg by mouth daily.   Yes Historical Provider, MD  Lactobacillus Rhamnosus, GG, (CULTURELLE PO) Take 1 capsule by mouth daily.    Yes Historical Provider, MD  levothyroxine (SYNTHROID, LEVOTHROID) 75 MCG tablet Take 1 tablet (75 mcg total) by mouth daily. 04/22/15  Yes Yvonne R Lowne, DO  lisinopril (PRINIVIL,ZESTRIL) 10 MG tablet TAKE ONE (1) TABLET BY MOUTH EVERY DAY 05/14/15  Yes Yvonne R Lowne, DO  memantine (NAMENDA XR) 7 MG CP24 24 hr capsule Take 1 capsule (7 mg total) by mouth daily. 04/13/15  Yes Yvonne R Lowne, DO  ranitidine (ZANTAC) 150 MG tablet Take 1 tablet (150 mg total) by mouth 2 (two) times daily. Patient taking differently: Take 150 mg by mouth at bedtime.  04/13/15  Yes Rosalita Chessman, DO  nitroGLYCERIN (NITROSTAT) 0.4 MG SL tablet Place 0.4 mg under the tongue every 5 (five) minutes as needed for chest pain.    Historical Provider, MD  ranitidine (ZANTAC) 150 MG/10ML syrup Take 10 mLs (150 mg total) by mouth 2 (two) times daily. Patient not taking: Reported on 05/23/2015 04/13/15   Alferd Apa Lowne, DO   BP 177/111 mmHg  Pulse 114  Temp(Src) 100.7 F (38.2 C) (Oral)  Resp 44  Ht 5\' 4"  (1.626 m)  Wt 126 lb 8 oz (57.38 kg)  BMI 21.70 kg/m2  SpO2 96% Physical Exam  Constitutional:  Frail  HENT:  Head: Normocephalic and atraumatic.  Cardiovascular: Regular rhythm.  Tachycardia present.   No murmur  heard. Pulmonary/Chest: Tachypnea noted. She is in respiratory distress.  Crackles in the left lung base Decreased air movement bilaterally  Abdominal: Soft. There is no tenderness. There is no rebound and no guarding.  Musculoskeletal: She exhibits no edema or tenderness.  Neurological:  Drowsy and confused Generalized weakness  Skin: Skin is warm and dry.  Psychiatric: She has a normal mood and affect. Her behavior is normal.  Nursing note and vitals reviewed.   ED Course  Procedures (including critical care time) DIAGNOSTIC STUDIES: Oxygen Saturation is 96% on 4L COORDINATION OF CARE: 5:58 PM Discussed treatment plan which includes lab work, CXR, EKG, and Tylenol  with the patient's family at bedside and they agreed to plan.  Labs Review Labs Reviewed  COMPREHENSIVE METABOLIC PANEL - Abnormal; Notable for the following:    Glucose, Bld 150 (*)    Creatinine, Ser 1.02 (*)    Total Protein 6.4 (*)    ALT 11 (*)    GFR calc non Af Amer 45 (*)    GFR calc Af Amer 52 (*)    All other components within normal limits  CBC WITH DIFFERENTIAL/PLATELET - Abnormal; Notable for the following:    Hemoglobin 15.6 (*)    HCT 46.9 (*)    Lymphs Abs 0.4 (*)    All other components within normal limits  URINALYSIS, ROUTINE W REFLEX MICROSCOPIC (NOT AT [facility]) - Abnormal; Notable for the following:    APPearance CLOUDY (*)    Hgb urine dipstick MODERATE (*)    Protein, ur 30 (*)    Leukocytes, UA SMALL (*)    All other components within normal limits  URINE MICROSCOPIC-ADD ON - Abnormal; Notable for the following:    Squamous Epithelial / LPF 0-5 (*)    Bacteria, UA MANY (*)    All other components within normal limits  I-STAT VENOUS BLOOD GAS, ED - Abnormal; Notable for the following:    pH, Ven 7.403 (*)    pCO2, Ven 42.0 (*)    Bicarbonate 25.7 (*)    All other components within normal limits  CULTURE, BLOOD (ROUTINE X 2)  CULTURE, BLOOD (ROUTINE X 2)  URINE CULTURE  RESPIRATORY  VIRUS PANEL  CULTURE, EXPECTORATED SPUTUM-ASSESSMENT  GRAM STAIN  INFLUENZA PANEL BY PCR (TYPE A & B, H1N1)  TSH  LACTIC ACID, PLASMA  LACTIC ACID, PLASMA  PROCALCITONIN  PROTIME-INR  APTT  STREP PNEUMONIAE URINARY ANTIGEN  LEGIONELLA ANTIGEN, URINE  I-STAT CG4 LACTIC ACID, ED    Imaging Review Dg Chest Port 1 View  05/23/2015  CLINICAL DATA:  Cough.  Fever. EXAM: PORTABLE CHEST 1 VIEW COMPARISON:  June 21, 2014 FINDINGS: No pneumothorax. There appears to be a moderate to large hiatal hernia. The heart, hila, and mediastinum are otherwise unremarkable. No pulmonary  nodules, masses, or infiltrates. No overt pulmonary edema. IMPRESSION: No active disease. Electronically Signed   By: Dorise Bullion III M.D   On: 05/23/2015 18:34   I have personally reviewed and evaluated these images and lab results as part of my medical decision-making.   EKG Interpretation None      MDM   Final diagnoses:  None   Patient here with cough, congestion, weakness. She is in respiratory distress on initial evaluation with lethargy. Sepsis protocol was initiated given her presentation. She started IV fluids, Tylenol for fever, antibiotics for presumed pneumonia given the crackles in her left lung base. On repeat evaluation patient is considerably improved condition. She is more alert and her work of breathing has lessened significantly. Plan to admit for further treatment.  I personally performed the services described in this documentation, which was scribed in my presence. The recorded information has been reviewed and is accurate.    Quintella Reichert, MD 05/23/15 [phone]

## 2015-05-23 NOTE — ED Notes (Signed)
Pt more alert and responsive at this time, follows commands, more interactive to nursing staff

## 2015-05-23 NOTE — Progress Notes (Signed)
This is a no charge note  Transfer from [facility] per Dr. Ralene Bathe  82 year old lady with past medical history of hypertension, GERD, anxiety, depression, diverticulitis, glaucoma, urinary incontinence, who presents with one-week of cough, shortness of breath. Patient is lethargic and confused.   In Ed, patient was found to have crackles bilaterally on auscultation, tachycardia, tachypnea, temperature 102.7, lactate 1.20, positive urinalysis with moderate amount of leukocytes, WBC 6.2, creatinine 1.02, negative chest x-ray for infiltration. Per Dr. Ralene Bathe, patient may had HCAP clinically. Flu PCR is pending. Patient was started with vancomycin and cefepime in ED. Pt is accepted to SDU as recommended by Dr. Ralene Bathe. Initially family requested patient be [place] [facility] where there is no bed available, then family requested patient be transferred to [place].  Ivor Costa, MD  [facility] Pager [phone]  If 7PM-7AM, please contact night-coverage [email or web address] Password [email or web address] 05/23/2015, 8:21 PM

## 2015-05-23 NOTE — ED Notes (Signed)
PCXR done, EDP reviewed at bedside as well

## 2015-05-23 NOTE — ED Notes (Signed)
High Fall Risk interventions remain in place

## 2015-05-23 NOTE — Progress Notes (Signed)
Pharmacy Antibiotic Note  Virginia Davidson is a 82 y.o. female admitted on 05/23/2015 with pneumonia.  Pharmacy has been consulted for vancomycin dosing. Tmax is 102.7 and WBC is WNL. Scr is WNL at 1.02. Lactic acid is 1.2.   Plan: - Vancomycin 1gm IV x 1 then 500mg  IV Q24h - F/u renal fxn, C&S, clinical status and trough at [facility] - F/u continuation of cefepime or other gram neg coverage  Height: 5\' 4"  (162.6 cm) Weight: 126 lb 8 oz (57.38 kg) IBW/kg (Calculated) : 54.7  Temp (24hrs), Avg:101.7 F (38.7 C), Min:100.7 F (38.2 C), Max:102.7 F (39.3 C)   Recent Labs Lab 05/23/15 1820 05/23/15 1821  WBC 6.2  --   CREATININE 1.02*  --   LATICACIDVEN  --  1.20    Estimated Creatinine Clearance: 27.2 mL/min (by C-G formula based on Cr of 1.02).    Allergies  Allergen Reactions  . Penicillins Rash  . Sulfamethoxazole Rash  . Alendronate Sodium     Unknown Reaction per caregiver  . Tetracyclines & Related     Unknown Reaction per caregiver    Antimicrobials this admission: Vanc 2/25>> Cefepime x 1 2/25  Dose adjustments this admission: N/A  Microbiology results: Pending  Thank you for allowing pharmacy to be a part of this patient's care.  Khrystal Jeanmarie, Folashade Wincek 05/23/2015 6:56 PM

## 2015-05-23 NOTE — ED Notes (Signed)
Pt brought in by family with pt having congested cough, not as alert as usual, having poor appetite. Appears lethargic at times. Placed on stretcher, high fowlers position, placed on cont cardiac monitor with cont POX and int NBP monitoring, 12 lead ECG obtained per EDP orders. Pt on 4lpm via [place] secondary to POX in the 80's.

## 2015-05-23 NOTE — ED Notes (Signed)
Family at bedside. 

## 2015-05-23 NOTE — ED Notes (Signed)
Per pt's daughter, pt has had a head cold all week, but today sounds very "rattley" and pt has been very lethargic and "not wanting to do anything - which is not like herself."

## 2015-05-23 NOTE — ED Notes (Signed)
Pt alert, NAD, calm, interactive, resps shallow, e/u, weak cough noted, speaking in clear completes sentences, family at [facility]. (Denies: pain, sob, nausea, dizziness or other sx). IVF and vanc infusing. VSS.

## 2015-05-23 NOTE — ED Notes (Signed)
MD at bedside. 

## 2015-05-24 DIAGNOSIS — J101 Influenza due to other identified influenza virus with other respiratory manifestations: Secondary | ICD-10-CM | POA: Diagnosis present

## 2015-05-24 DIAGNOSIS — N3000 Acute cystitis without hematuria: Secondary | ICD-10-CM

## 2015-05-24 LAB — MRSA PCR SCREENING: MRSA by PCR: NEGATIVE

## 2015-05-24 LAB — BASIC METABOLIC PANEL WITH GFR
Anion gap: 7 (ref 5–15)
BUN: 18 mg/dL (ref 6–20)
CO2: 21 mmol/L — ABNORMAL LOW (ref 22–32)
Calcium: 8.8 mg/dL — ABNORMAL LOW (ref 8.9–10.3)
Chloride: 112 mmol/L — ABNORMAL HIGH (ref 101–111)
Creatinine, Ser: 0.78 mg/dL (ref 0.44–1.00)
GFR calc Af Amer: >60 mL/min
GFR calc non Af Amer: >60 mL/min
Glucose, Bld: 86 mg/dL (ref 65–99)
Potassium: 3.4 mmol/L — ABNORMAL LOW (ref 3.5–5.1)
Sodium: 140 mmol/L (ref 135–145)

## 2015-05-24 LAB — TSH: TSH: 0.024 u[IU]/mL — ABNORMAL LOW (ref 0.350–4.500)

## 2015-05-24 LAB — PROCALCITONIN: Procalcitonin: <0.1 ng/mL

## 2015-05-24 LAB — LACTIC ACID, PLASMA
Lactic Acid, Venous: 1.2 mmol/L (ref 0.5–2.0)
Lactic Acid, Venous: 1.6 mmol/L (ref 0.5–2.0)

## 2015-05-24 LAB — CBC
HCT: 42 % (ref 36.0–46.0)
HEMOGLOBIN: 13.4 g/dL (ref 12.0–15.0)
MCH: 30.7 pg (ref 26.0–34.0)
MCHC: 31.9 g/dL (ref 30.0–36.0)
MCV: 96.1 fL (ref 78.0–100.0)
Platelets: 132 10*3/uL — ABNORMAL LOW (ref 150–400)
RBC: 4.37 MIL/uL (ref 3.87–5.11)
RDW: 15.2 % (ref 11.5–15.5)
WBC: 3.4 10*3/uL — ABNORMAL LOW (ref 4.0–10.5)

## 2015-05-24 LAB — STREP PNEUMONIAE URINARY ANTIGEN: Strep Pneumo Urinary Antigen: NEGATIVE

## 2015-05-24 LAB — INFLUENZA PANEL BY PCR (TYPE A & B)
H1N1FLUPCR: NOT DETECTED
INFLAPCR: POSITIVE — AB
INFLBPCR: NEGATIVE

## 2015-05-24 LAB — MAGNESIUM: Magnesium: 1.6 mg/dL — ABNORMAL LOW (ref 1.7–2.4)

## 2015-05-24 MED ORDER — LISINOPRIL 10 MG PO TABS
10.0000 mg | ORAL_TABLET | Freq: Every day | ORAL | Status: DC
  Administered 2015-05-24 – 2015-05-28 (×5): 10 mg via ORAL
  Filled 2015-05-24 (×5): qty 1

## 2015-05-24 MED ORDER — OSELTAMIVIR PHOSPHATE 30 MG PO CAPS
30.0000 mg | ORAL_CAPSULE | Freq: Every day | ORAL | Status: DC
  Filled 2015-05-24: qty 1

## 2015-05-24 MED ORDER — LEVOTHYROXINE SODIUM 125 MCG PO TABS
62.5000 ug | ORAL_TABLET | Freq: Every day | ORAL | Status: DC
  Administered 2015-05-24 – 2015-05-28 (×5): 62.5 ug via ORAL
  Filled 2015-05-24 (×6): qty 0.5

## 2015-05-24 MED ORDER — HYDROCODONE-HOMATROPINE 5-1.5 MG/5ML PO SYRP
5.0000 mL | ORAL_SOLUTION | Freq: Four times a day (QID) | ORAL | Status: DC | PRN
  Administered 2015-05-25 – 2015-05-27 (×3): 5 mL via ORAL
  Filled 2015-05-24 (×3): qty 5

## 2015-05-24 MED ORDER — ENOXAPARIN SODIUM 40 MG/0.4ML ~~LOC~~ SOLN
40.0000 mg | Freq: Every day | SUBCUTANEOUS | Status: DC
  Administered 2015-05-24: 40 mg via SUBCUTANEOUS
  Filled 2015-05-24: qty 0.4

## 2015-05-24 MED ORDER — OSELTAMIVIR PHOSPHATE 30 MG PO CAPS
30.0000 mg | ORAL_CAPSULE | Freq: Two times a day (BID) | ORAL | Status: DC
  Administered 2015-05-24 – 2015-05-28 (×9): 30 mg via ORAL
  Filled 2015-05-24 (×11): qty 1

## 2015-05-24 MED ORDER — TAMSULOSIN HCL 0.4 MG PO CAPS
0.4000 mg | ORAL_CAPSULE | Freq: Every day | ORAL | Status: DC
  Administered 2015-05-26 – 2015-05-27 (×2): 0.4 mg via ORAL
  Filled 2015-05-24 (×4): qty 1

## 2015-05-24 MED ORDER — POTASSIUM CHLORIDE CRYS ER 20 MEQ PO TBCR
40.0000 meq | EXTENDED_RELEASE_TABLET | Freq: Once | ORAL | Status: AC
  Administered 2015-05-24: 40 meq via ORAL
  Filled 2015-05-24: qty 2

## 2015-05-24 MED ORDER — VANCOMYCIN HCL IN DEXTROSE 750-5 MG/150ML-% IV SOLN
750.0000 mg | INTRAVENOUS | Status: DC
  Administered 2015-05-24: 750 mg via INTRAVENOUS
  Filled 2015-05-24: qty 150

## 2015-05-24 MED ORDER — LEVALBUTEROL HCL 1.25 MG/0.5ML IN NEBU
1.2500 mg | INHALATION_SOLUTION | Freq: Four times a day (QID) | RESPIRATORY_TRACT | Status: DC
  Administered 2015-05-24: 1.25 mg via RESPIRATORY_TRACT
  Filled 2015-05-24: qty 0.5

## 2015-05-24 MED ORDER — HALOPERIDOL LACTATE 5 MG/ML IJ SOLN
1.0000 mg | Freq: Once | INTRAMUSCULAR | Status: AC
  Administered 2015-05-24: 1 mg via INTRAVENOUS
  Filled 2015-05-24: qty 1

## 2015-05-24 MED ORDER — MAGNESIUM SULFATE 50 % IJ SOLN
3.0000 g | Freq: Once | INTRAVENOUS | Status: AC
  Administered 2015-05-24: 3 g via INTRAVENOUS
  Filled 2015-05-24: qty 6

## 2015-05-24 MED ORDER — ENOXAPARIN SODIUM 30 MG/0.3ML ~~LOC~~ SOLN: 30.0000 mg | Freq: Every day | SUBCUTANEOUS | Status: DC

## 2015-05-24 MED ORDER — LEVALBUTEROL HCL 1.25 MG/0.5ML IN NEBU
1.2500 mg | INHALATION_SOLUTION | Freq: Two times a day (BID) | RESPIRATORY_TRACT | Status: DC
  Administered 2015-05-24 – 2015-05-25 (×2): 1.25 mg via RESPIRATORY_TRACT
  Filled 2015-05-24 (×2): qty 0.5

## 2015-05-24 NOTE — H&P (Addendum)
[facility] History and Physical  Virginia Davidson [id number] DOB: July 22, 1916 DOA: 05/23/2015  Referring physician: ED physician PCP: Garnet Koyanagi, DO  Specialists:   Chief Complaint: Productive cough, shortness of breath, altered mental status.  HPI: Virginia Davidson is a 82 y.o. female with PMH of hypertension, GERD, anxiety, dementia, AAA, diverticulitis, glaucoma, urinary incontinence, recurrent UTI, who presents with productive cough, short of breath and altered mental status.  Per patient's son and daughter-in-law, patient has been confused in the past 2 days. She has productive cough, no chest pain. She also has fever, chills and decreased oral intake. Patient does not have diarrhea, abdominal pain, nausea, vomiting. She moves all extremities per family.  In ED, patient was found to have WBC 6.2, positive urinalysis with moderate amount of leukocytes, temperature 102.7, tachycardia, tachypnea, creatinine 1.02, negative chest x-ray for acute abnormalities, lactate 1.20. Patient is admitted to inpatient for foot infection treatment.  EKG: Not done in ED, will get one.   Where does patient live?  Assistant living facility   Can patient participate in ADLs? None  Review of Systems:   General: has fevers, chills, no changes in body weight, has poor appetite, has fatigue HEENT: no blurry vision, hearing changes or sore throat Pulm: has dyspnea, coughing, no wheezing CV: no chest pain, no palpitations Abd: no nausea, vomiting, abdominal pain, diarrhea, constipation GU: no dysuria, burning on urination, increased urinary frequency, hematuria  Ext: no leg edema Neuro: has confusion Skin: no rash MSK: No muscle spasm, no deformity, no limitation of range of movement in spin Heme: No easy bruising.  Travel history: No recent long distant travel.  Allergy:  Allergies  Allergen Reactions  . Penicillins Rash    Family is not sure of reaction as far as breathing issues are  concerned  . Sulfamethoxazole Rash  . Alendronate Sodium     Unknown Reaction per caregiver  . Tetracyclines & Related     Unknown Reaction per caregiver    Past Medical History  Diagnosis Date  . Hypertension   . Thyroid disease   . Dementia   . Abdominal aneurysm ([place])   . Diverticulitis   . Glaucoma   . Urinary incontinence     Past Surgical History  Procedure Laterality Date  . Bladder surgery N/A     x2  . Hip surgery Right     Social History:  reports that she has never smoked. She has never used smokeless tobacco. She reports that she does not drink alcohol or use illicit drugs.  Family History:  Family History  Problem Relation Age of Onset  . Stroke Mother      Prior to Admission medications   Medication Sig Start Date End Date Taking? Authorizing Provider  ALPRAZolam (XANAX) 0.25 MG tablet TAKE ONE TABLET 3 TIMES A DAY AS NEEDED. Patient taking differently: TAKE ONE TABLET 3 TIMES A DAY AS NEEDED FOR ANXIETY 12/22/14  Yes Alferd Apa Lowne, DO  Calcium Carbonate-Vitamin D 600-200 MG-UNIT CAPS Take 1 tablet by mouth 2 (two) times daily.   Yes Historical Provider, MD  dextromethorphan (DELSYM) 30 MG/5ML liquid Take 30 mg by mouth as needed for cough.   Yes Historical Provider, MD  fexofenadine (ALLEGRA) 180 MG tablet Take 180 mg by mouth daily.   Yes Historical Provider, MD  Lactobacillus Rhamnosus, GG, (CULTURELLE PO) Take 1 capsule by mouth daily.    Yes Historical Provider, MD  levothyroxine (SYNTHROID, LEVOTHROID) 75 MCG tablet Take 1 tablet (75 mcg total) by  mouth daily. 04/22/15  Yes Yvonne R Lowne, DO  lisinopril (PRINIVIL,ZESTRIL) 10 MG tablet TAKE ONE (1) TABLET BY MOUTH EVERY DAY 05/14/15  Yes Yvonne R Lowne, DO  memantine (NAMENDA XR) 7 MG CP24 24 hr capsule Take 1 capsule (7 mg total) by mouth daily. 04/13/15  Yes Yvonne R Lowne, DO  ranitidine (ZANTAC) 150 MG tablet Take 1 tablet (150 mg total) by mouth 2 (two) times daily. Patient taking differently: Take  150 mg by mouth at bedtime.  04/13/15  Yes Yvonne R Lowne, DO  nitroGLYCERIN (NITROSTAT) 0.4 MG SL tablet Place 0.4 mg under the tongue every 5 (five) minutes as needed for chest pain.    Historical Provider, MD  ranitidine (ZANTAC) 150 MG/10ML syrup Take 10 mLs (150 mg total) by mouth 2 (two) times daily. Patient not taking: Reported on 05/23/2015 04/13/15   Rosalita Chessman, DO    Physical Exam: Filed Vitals:   05/23/15 2100 05/23/15 2200 05/23/15 2300 05/24/15 0000  BP: 105/60 118/59  147/67  Pulse: 79 72 66 75  Temp:    98.6 F (37 C)  TempSrc:    Oral  Resp: 26 28 22  32  Height:  5\' 4"  (1.626 m)    Weight:  54 kg (119 lb 0.8 oz)    SpO2: 98% 99% 100% 100%   General: Not in acute distress HEENT:       Eyes: PERRL, EOMI, no scleral icterus.       ENT: No discharge from the ears and nose, no pharynx injection, no tonsillar enlargement.        Neck: No JVD, no bruit, no mass felt. Heme: No neck lymph node enlargement. Cardiac: S1/S2, RRR, No murmurs, No gallops or rubs. Pulm: has rales bilaterally, no wheezing or rubs. Abd: Soft, nondistended, nontender, no rebound pain, no organomegaly, BS present. Ext: No pitting leg edema bilaterally. 2+DP/PT pulse bilaterally. Musculoskeletal: No joint deformities, No joint redness or warmth, no limitation of ROM in spin. Skin: No rashes.  Neuro: Alert, not oriented X3, cranial nerves II-XII grossly intact, moves all extremities. Psych: Patient is not psychotic, no suicidal or hemocidal ideation.  Labs on Admission:  Basic Metabolic Panel:  Recent Labs Lab 05/23/15 1820  NA 140  K 4.0  CL 105  CO2 26  GLUCOSE 150*  BUN 19  CREATININE 1.02*  CALCIUM 9.3   Liver Function Tests:  Recent Labs Lab 05/23/15 1820  AST 23  ALT 11*  ALKPHOS 65  BILITOT 0.6  PROT 6.4*  ALBUMIN 3.6   No results for input(s): LIPASE, AMYLASE in the last 168 hours. No results for input(s): AMMONIA in the last 168 hours. CBC:  Recent Labs Lab  05/23/15 1820  WBC 6.2  NEUTROABS 4.9  HGB 15.6*  HCT 46.9*  MCV 93.6  PLT 150   Cardiac Enzymes: No results for input(s): CKTOTAL, CKMB, CKMBINDEX, TROPONINI in the last 168 hours.  BNP (last 3 results) No results for input(s): BNP in the last 8760 hours.  ProBNP (last 3 results) No results for input(s): PROBNP in the last 8760 hours.  CBG: No results for input(s): GLUCAP in the last 168 hours.  Radiological Exams on Admission: Dg Chest Port 1 View  05/23/2015  CLINICAL DATA:  Cough.  Fever. EXAM: PORTABLE CHEST 1 VIEW COMPARISON:  June 21, 2014 FINDINGS: No pneumothorax. There appears to be a moderate to large hiatal hernia. The heart, hila, and mediastinum are otherwise unremarkable. No pulmonary nodules, masses, or infiltrates. No  overt pulmonary edema. IMPRESSION: No active disease. Electronically Signed   By: Dorise Bullion III M.D   On: 05/23/2015 18:34    Assessment/Plan Principal Problem:   Acute respiratory failure (HCC) Active Problems:   Essential hypertension, benign   Hypothyroidism   GERD (gastroesophageal reflux disease)   Dementia   Anxiety   UTI (urinary tract infection)   Sepsis ([place])   HCAP (healthcare-associated pneumonia)   Acute respiratory failure due to possible HCAP and sepsis: Patient has productive cough, shortness of breath and crackles on auscultation, clinically consistency with possible HCAP. She may have early stage of pneumonia since chest x-ray did not show infiltration. Patient is septic on admission with tachycardia, tachypnea and fever. Lactate is 1.20, hemodynamically stable.  - Will admit to Telemetry Bed - IV Vancomycin and Aztreonam.  - Mucinex for cough  - Xopenex Neb prn for SOB - Urine legionella and S. pneumococcal antigen - Follow up blood culture x2, sputum culture and respiratory virus panel, plus Flu pcr - will get Procalcitonin and trend lactic acid level per sepsis protocol - IVF: 1.5L of NS bolus in ED, followed  by 75 mL per hour of NS - SLP  UTI: pt has positive positive urinalysis, not sure if patient has symptoms. -On IV antibiotics as above -Follow-up urine culture  Acute encephalopathy: Most likely due to sepsis secondary to possible HCAP and UTI -Treat underlying issues as above -Frequent neuro check  HTN: -Hold lisinopril since patient is at risk of developing hypotension due to sepsis -IV hydralazine when necessary  Hypothyroidism: Last TSH was 0.08 on 04/13/15 -Continue home Synthroid -Check TSH  GERD: -Pepcid  Dementia: -continue home Namenda  Anxiety: Continue home when necessary Xanax  DVT ppx: SQ Lovenox  Code Status: DNR Family Communication:   Yes, patient's son and daughter-in law at bed side Disposition Plan: Admit to inpatient   Date of Service: Patient is an admitted on 05/23/15, history of present illness is completed on 05/24/15.    Ivor Costa [facility] Pager [phone]  If 7PM-7AM, please contact night-coverage [email or web address] Password [facility] 05/24/2015, 4:55 AM

## 2015-05-24 NOTE — Progress Notes (Signed)
OT Cancellation Note  Patient Details Name: Virginia Davidson MRN: [id number] DOB: 01-07-1917   Cancelled Treatment:    Reason Eval/Treat Not Completed: Medical issues which prohibited therapy -- patient not medically ready. Will follow up for OT evaluation as indicated.  [name] A 05/24/2015, 10:36 AM

## 2015-05-24 NOTE — Progress Notes (Signed)
Utilization review completed.  

## 2015-05-24 NOTE — Progress Notes (Signed)
PT Cancellation Note  Patient Details Name: Virginia Davidson MRN: [id number] DOB: 10/03/1916   Cancelled Treatment:    Reason Eval/Treat Not Completed: Patient not medically ready (RN recommended towait on PT. will check back tomorrow.)   Claretha Cooper 05/24/2015, 9:03 AM Tresa Endo PT [phone]

## 2015-05-24 NOTE — Progress Notes (Signed)
Per Family request, removed Pt dentures & gave to family member, who put in pink denture cup.

## 2015-05-24 NOTE — Progress Notes (Addendum)
Pharmacy - Brief Note  Please refer to pharmacist note from last evening for full details.  Assessment: SCr has improved with hydration  Plan: - based on improvement in renal fx:  -adjust tamiflu to 30mg  BID and vancomycin to 750mg  IV q24h  - change enoxaparin back to 40mg  SQ q24h - Appears patient was given cephalexin in past, consider change aztreonam to cephalosporin if deemed appropriate.  Doreene Eland, PharmD, BCPS.   Pager: [phone] 05/24/2015 10:52 AM

## 2015-05-24 NOTE — Progress Notes (Signed)
I have seen and assessed patient and agree with Dr.Niu assessment and plan. Patient is a pleasant 82 year old female presenting with a 2 day history of a productive cough, shortness of breath, altered mental status. Patient noted on admission to have a fever with a temperature 102.7, tachycardic, tachypneic, with a lactate of 1.20. Chest x-ray was negative for any acute abnormalities. Urinalysis done was consistent with a UTI. Patient has been pancultured and results are pending. Influenza panel was positive for influenza A. Patient was admitted to the stepdown unit placed on IV fluids, continue empiric IV antibiotics, continue Tamiflu, droplet precautions, supportive care.

## 2015-05-25 ENCOUNTER — Inpatient Hospital Stay (HOSPITAL_COMMUNITY): Payer: Medicare Other

## 2015-05-25 DIAGNOSIS — N3001 Acute cystitis with hematuria: Secondary | ICD-10-CM

## 2015-05-25 DIAGNOSIS — R339 Retention of urine, unspecified: Secondary | ICD-10-CM | POA: Diagnosis present

## 2015-05-25 LAB — MAGNESIUM: MAGNESIUM: 1.9 mg/dL (ref 1.7–2.4)

## 2015-05-25 LAB — URINE CULTURE: CULTURE: NO GROWTH

## 2015-05-25 LAB — LEGIONELLA ANTIGEN, URINE

## 2015-05-25 LAB — CBC WITH DIFFERENTIAL/PLATELET
BASOS ABS: 0 10*3/uL (ref 0.0–0.1)
Basophils Relative: 1 %
EOS ABS: 0.1 10*3/uL (ref 0.0–0.7)
EOS PCT: 4 %
HCT: 40.8 % (ref 36.0–46.0)
Hemoglobin: 13.3 g/dL (ref 12.0–15.0)
LYMPHS PCT: 25 %
Lymphs Abs: 0.8 10*3/uL (ref 0.7–4.0)
MCH: 30.5 pg (ref 26.0–34.0)
MCHC: 32.6 g/dL (ref 30.0–36.0)
MCV: 93.6 fL (ref 78.0–100.0)
MONO ABS: 0.6 10*3/uL (ref 0.1–1.0)
Monocytes Relative: 18 %
Neutro Abs: 1.6 10*3/uL — ABNORMAL LOW (ref 1.7–7.7)
Neutrophils Relative %: 52 %
PLATELETS: 128 10*3/uL — AB (ref 150–400)
RBC: 4.36 MIL/uL (ref 3.87–5.11)
RDW: 14.7 % (ref 11.5–15.5)
WBC: 3.1 10*3/uL — ABNORMAL LOW (ref 4.0–10.5)

## 2015-05-25 LAB — BASIC METABOLIC PANEL
Anion gap: 7 (ref 5–15)
BUN: 12 mg/dL (ref 6–20)
CALCIUM: 9 mg/dL (ref 8.9–10.3)
CO2: 23 mmol/L (ref 22–32)
CREATININE: 0.7 mg/dL (ref 0.44–1.00)
Chloride: 110 mmol/L (ref 101–111)
GFR calc Af Amer: >60 mL/min (ref >60)
GLUCOSE: 86 mg/dL (ref 65–99)
POTASSIUM: 3.6 mmol/L (ref 3.5–5.1)
SODIUM: 140 mmol/L (ref 135–145)

## 2015-05-25 MED ORDER — LEVALBUTEROL HCL 0.63 MG/3ML IN NEBU: 0.6300 mg | INHALATION_SOLUTION | RESPIRATORY_TRACT | Status: DC | PRN

## 2015-05-25 MED ORDER — ALPRAZOLAM 0.25 MG PO TABS: 0.2500 mg | ORAL_TABLET | Freq: Two times a day (BID) | ORAL | Status: DC | PRN

## 2015-05-25 MED ORDER — ENOXAPARIN SODIUM 30 MG/0.3ML ~~LOC~~ SOLN
30.0000 mg | Freq: Every day | SUBCUTANEOUS | Status: DC
  Administered 2015-05-25 – 2015-05-27 (×3): 30 mg via SUBCUTANEOUS
  Filled 2015-05-25 (×4): qty 0.3

## 2015-05-25 NOTE — Evaluation (Signed)
SLP Cancellation Note  Patient Details Name: Virginia Davidson MRN: [id number] DOB: Dec 06, 1916   Cancelled treatment:       Reason Eval/Treat Not Completed: Medical issues which prohibited therapy;Fatigue/lethargy limiting ability to participate (pt had Xanax and is lethargic per RN, RN reports pt tolerating po medications, will reattempt as schedule allows)   Claudie Fisherman, [facility] SLP [phone]

## 2015-05-25 NOTE — Progress Notes (Signed)
[facility] PROGRESS NOTE  Virginia Davidson [id number] DOB: 22-Oct-1916 DOA: 05/23/2015 PCP: Garnet Koyanagi, DO  Assessment/Plan: #1 acute respiratory failure secondary to influenza A/sepsis and probable healthcare associated pneumonia Patient with clinical improvement. Some improvement with cough and shortness of breath. Lungs clear on auscultation with no wheezing. Repeat chest x-ray pending. Sputum Gram stain and culture pending. Blood cultures pending. Tachycardia improved. Tachypnea improved. Patient currently afebrile. Patient with sats of 96% on room air. Will discontinue IV vancomycin. Continue IV aztreonam, Tamiflu, IV fluids, Mucinex, nebulizer treatments.  #2 influenza A Clinical improvement. Continue Tamiflu, IV fluids, nebulizer treatments, supportive care.  #3 urinary tract infection Urine cultures pending. Patient on IV aztreonam. Follow.  #4 acute encephalopathy Likely secondary to sepsis, influenza A, possible healthcare associated pneumonia, UTI. Clinical improvement. Continue IV fluids, supportive care, Tamiflu, IV antibiotics.  #5 urinary retention Patient noted to have over 500 mL per bladder scan yesterday. Foley catheter has been placed. Continue Flomax. Patient will likely need a voiding trial in a few days.  #6 hypertension Continue lisinopril.  #7 hypothyroidism TSH was 0.024. Synthroid dose has been decreased to 62.5 MCG's daily. Patient will likely need repeat thyroid function studies done in about 4-6 weeks.  #8 gastroesophageal reflux disease  Pepcid.  #9 dementia Continue home regimen Namenda.  #10 anxiety Center axis needed.  #11 prophylaxis Pepcid for GI prophylaxis. Lovenox for DVT prophylaxis.  Code Status: DO NOT RESUSCITATE Family Communication: Updated patient, no family present. Disposition Plan: Transfer to med surg.   Consultants:  None  Procedures:  CXR 05/25/2015, 05/23/2015  Antibiotics:  IV Vancomycin  05/24/2015>>>>05/25/2015  IV Azactam 05/24/2015  HPI/Subjective: Patient being fed. Patient denies any SOB. No CP.Patient states cough improved.  Objective: Filed Vitals:   05/25/15 0759 05/25/15 0800  BP: 146/59 146/59  Pulse: 69 73  Temp:  97.7 F (36.5 C)  Resp: 22 17    Intake/Output Summary (Last 24 hours) at 05/25/15 0914 Last data filed at 05/25/15 0800  Gross per 24 hour  Intake   2300 ml  Output   2227 ml  Net     73 ml   Filed Weights   05/23/15 1716 05/23/15 2200 05/24/15 0400  Weight: 57.38 kg (126 lb 8 oz) 54 kg (119 lb 0.8 oz) 55.8 kg (123 lb 0.3 oz)    Exam:   General:  NAD  Cardiovascular: RRR  Respiratory: CTAB  Abdomen: Soft/NT/ND/+BS  Musculoskeletal: No c/c/e  Data Reviewed: Basic Metabolic Panel:  Recent Labs Lab 05/23/15 1820 05/24/15 0803 05/25/15 0352  NA 140 140 140  K 4.0 3.4* 3.6  CL 105 112* 110  CO2 26 21* 23  GLUCOSE 150* 86 86  BUN 19 18 12   CREATININE 1.02* 0.78 0.70  CALCIUM 9.3 8.8* 9.0  MG  --  1.6* 1.9   Liver Function Tests:  Recent Labs Lab 05/23/15 1820  AST 23  ALT 11*  ALKPHOS 65  BILITOT 0.6  PROT 6.4*  ALBUMIN 3.6   No results for input(s): LIPASE, AMYLASE in the last 168 hours. No results for input(s): AMMONIA in the last 168 hours. CBC:  Recent Labs Lab 05/23/15 1820 05/24/15 0803 05/25/15 0352  WBC 6.2 3.4* 3.1*  NEUTROABS 4.9  --  1.6*  HGB 15.6* 13.4 13.3  HCT 46.9* 42.0 40.8  MCV 93.6 96.1 93.6  PLT 150 132* 128*   Cardiac Enzymes: No results for input(s): CKTOTAL, CKMB, CKMBINDEX, TROPONINI in the last 168 hours. BNP (last 3 results) No  results for input(s): BNP in the last 8760 hours.  ProBNP (last 3 results) No results for input(s): PROBNP in the last 8760 hours.  CBG: No results for input(s): GLUCAP in the last 168 hours.  Recent Results (from the past 240 hour(s))  Blood Culture (routine x 2)     Status: None (Preliminary result)   Collection Time: 05/23/15  6:15 PM   Result Value Ref Range Status   Specimen Description BLOOD LEFT AC  Final   Special Requests BOTTLES DRAWN AEROBIC AND ANAEROBIC 10CC EACH  Final   Culture   Final    NO GROWTH < 24 HOURS Performed at [facility]    Report Status PENDING  Incomplete  Blood Culture (routine x 2)     Status: None (Preliminary result)   Collection Time: 05/23/15  6:20 PM  Result Value Ref Range Status   Specimen Description BLOOD RIGHT AC  Final   Special Requests BOTTLES DRAWN AEROBIC AND ANAEROBIC 5CC EACH  Final   Culture   Final    NO GROWTH < 24 HOURS Performed at [facility]    Report Status PENDING  Incomplete  Urine culture     Status: None (Preliminary result)   Collection Time: 05/23/15  8:00 PM  Result Value Ref Range Status   Specimen Description URINE, CATHETERIZED  Final   Special Requests NONE  Final   Culture   Final    NO GROWTH < 24 HOURS Performed at [facility]    Report Status PENDING  Incomplete  MRSA PCR Screening     Status: None   Collection Time: 05/24/15  8:19 AM  Result Value Ref Range Status   MRSA by PCR NEGATIVE NEGATIVE Final    Comment:        The GeneXpert MRSA Assay (FDA approved for NASAL specimens only), is one component of a comprehensive MRSA colonization surveillance program. It is not intended to diagnose MRSA infection nor to guide or monitor treatment for MRSA infections.      Studies: Dg Chest Port 1 View  05/23/2015  CLINICAL DATA:  Cough.  Fever. EXAM: PORTABLE CHEST 1 VIEW COMPARISON:  June 21, 2014 FINDINGS: No pneumothorax. There appears to be a moderate to large hiatal hernia. The heart, hila, and mediastinum are otherwise unremarkable. No pulmonary nodules, masses, or infiltrates. No overt pulmonary edema. IMPRESSION: No active disease. Electronically Signed   By: Dorise Bullion III M.D   On: 05/23/2015 18:34    Scheduled Meds: . acidophilus  1 capsule Oral Daily  . aztreonam  1 g Intravenous Q8H  .  calcium-vitamin D  1 tablet Oral BID WC  . dextromethorphan-guaiFENesin  1 tablet Oral BID  . enoxaparin (LOVENOX) injection  40 mg Subcutaneous QHS  . famotidine  10 mg Oral Daily  . levalbuterol  1.25 mg Nebulization BID  . levothyroxine  62.5 mcg Oral QAC breakfast  . lisinopril  10 mg Oral Daily  . loratadine  10 mg Oral Daily  . memantine  7 mg Oral Daily  . oseltamivir  30 mg Oral BID  . tamsulosin  0.4 mg Oral QPC supper  . vancomycin  750 mg Intravenous Q24H   Continuous Infusions: . sodium chloride 75 mL/hr (05/24/15 0804)    Principal Problem:   Acute respiratory failure (HCC) Active Problems:   HCAP (healthcare-associated pneumonia)   Influenza A (H1N1)   Essential hypertension, benign   Hypothyroidism   GERD (gastroesophageal reflux disease)   Dementia  Anxiety   UTI (urinary tract infection)   Sepsis ([place])   Urinary retention    Time spent: 40 mins    [facility] MD [facility] Pager [phone]. If 7PM-7AM, please contact night-coverage at [email or web address], password [facility] 05/25/2015, 9:14 AM  LOS: 2 days

## 2015-05-25 NOTE — Progress Notes (Signed)
PT Cancellation Note  Patient Details Name: Skyelar Litwiller MRN: [id number] DOB: Oct 23, 1916   Cancelled Treatment:    Reason Eval/Treat Not Completed: Fatigue/lethargy limiting ability to participate (had xanax and is lethargic. UWA by nursing.)   Claretha Cooper 05/25/2015, 12:44 PM Tresa Endo PT [phone]

## 2015-05-25 NOTE — Progress Notes (Signed)
OT Cancellation Note  Patient Details Name: Sabrena Jupiter MRN: [id number] DOB: 06-05-1916   Cancelled Treatment:    Reason Eval/Treat Not Completed: Fatigue/lethargy limiting ability to participate (pt had Xanax and is lethargic )  Betsy Pries 05/25/2015, 2:28 PM

## 2015-05-25 NOTE — Progress Notes (Signed)
Sedate post Xanax given much earlier. Able to swallow water without difficulty , but not able to swallow pills, even in applesauce. Swallow study pending. Pills removed from mouth.

## 2015-05-26 LAB — CBC WITH DIFFERENTIAL/PLATELET
BASOS ABS: 0 10*3/uL (ref 0.0–0.1)
Basophils Relative: 0 %
EOS PCT: 8 %
Eosinophils Absolute: 0.2 10*3/uL (ref 0.0–0.7)
HCT: 40.7 % (ref 36.0–46.0)
Hemoglobin: 13.3 g/dL (ref 12.0–15.0)
LYMPHS PCT: 28 %
Lymphs Abs: 0.8 10*3/uL (ref 0.7–4.0)
MCH: 30.8 pg (ref 26.0–34.0)
MCHC: 32.7 g/dL (ref 30.0–36.0)
MCV: 94.2 fL (ref 78.0–100.0)
MONO ABS: 0.5 10*3/uL (ref 0.1–1.0)
MONOS PCT: 18 %
Neutro Abs: 1.3 10*3/uL — ABNORMAL LOW (ref 1.7–7.7)
Neutrophils Relative %: 46 %
PLATELETS: 152 10*3/uL (ref 150–400)
RBC: 4.32 MIL/uL (ref 3.87–5.11)
RDW: 14.8 % (ref 11.5–15.5)
WBC: 2.7 10*3/uL — ABNORMAL LOW (ref 4.0–10.5)

## 2015-05-26 LAB — RESPIRATORY VIRUS PANEL
ADENOVIRUS: NEGATIVE
INFLUENZA B 1: NEGATIVE
Influenza A: POSITIVE — AB
Metapneumovirus: NEGATIVE
PARAINFLUENZA 1 A: NEGATIVE
Parainfluenza 2: NEGATIVE
Parainfluenza 3: NEGATIVE
RESPIRATORY SYNCYTIAL VIRUS A: NEGATIVE
RESPIRATORY SYNCYTIAL VIRUS B: NEGATIVE
RHINOVIRUS: NEGATIVE

## 2015-05-26 LAB — BASIC METABOLIC PANEL
Anion gap: 4 — ABNORMAL LOW (ref 5–15)
BUN: 12 mg/dL (ref 6–20)
CO2: 25 mmol/L (ref 22–32)
Calcium: 8.8 mg/dL — ABNORMAL LOW (ref 8.9–10.3)
Chloride: 110 mmol/L (ref 101–111)
Creatinine, Ser: 0.57 mg/dL (ref 0.44–1.00)
GFR calc Af Amer: >60 mL/min (ref >60)
GLUCOSE: 87 mg/dL (ref 65–99)
POTASSIUM: 3.7 mmol/L (ref 3.5–5.1)
Sodium: 139 mmol/L (ref 135–145)

## 2015-05-26 LAB — MAGNESIUM: Magnesium: 1.6 mg/dL — ABNORMAL LOW (ref 1.7–2.4)

## 2015-05-26 MED ORDER — MAGNESIUM SULFATE 50 % IJ SOLN
3.0000 g | Freq: Once | INTRAVENOUS | Status: AC
  Administered 2015-05-26: 3 g via INTRAVENOUS
  Filled 2015-05-26: qty 6

## 2015-05-26 MED ORDER — LORAZEPAM 2 MG/ML IJ SOLN: 0.2500 mg | Freq: Two times a day (BID) | INTRAMUSCULAR | Status: DC | PRN

## 2015-05-26 NOTE — Progress Notes (Signed)
OT Cancellation Note  Patient Details Name: Virginia Davidson MRN: [id number] DOB: December 19, 1916   Cancelled Treatment:    Pt has 24/7 A with ADL activity. Caregiver reports they A with bathing, dressing, toileting and self feeding if needed.  Will sign off   Mosses, Thereasa Parkin 05/26/2015, 11:33 AM

## 2015-05-26 NOTE — Care Management Important Message (Signed)
Important Message  Patient Details IM Letter given to Cookie/Case Manager to present to Patient Name: Virginia Davidson MRN: [id number] Date of Birth: 1917-03-22   Medicare Important Message Given:  Yes    Camillo Flaming 05/26/2015, 1:26 [place] Message  Patient Details  Name: Virginia Davidson MRN: [id number] Date of Birth: Oct 17, 1916   Medicare Important Message Given:  Yes    Camillo Flaming 05/26/2015, 1:26 PM

## 2015-05-26 NOTE — Evaluation (Signed)
Physical Therapy Evaluation Patient Details Name: Virginia Davidson MRN: [id number] DOB: 10-Oct-1916 Today's Date: 05/26/2015   History of Present Illness  82 year old lady with past medical history of hypertension, GERD, anxiety, depression, diverticulitis, glaucoma, urinary incontinence, who presents 2/25/17with one-week of cough, shortness of breath. Admitted 05/23/15 with acute respiratory failure secondary to influenza A/sepsis and probable healthcare associated pneumonia.   Clinical Impression  The patient was able to mobilize to the recliner with mod/max assistance. The caregiver Jackelyn Hoehn reports  That patient was ambulatory with assistance PTA. Appears that patient came from ALF. No family present. Pt admitted with above diagnosis. Pt currently with functional limitations due to the deficits listed below (see PT Problem List).  Pt will benefit from skilled PT to increase their independence and safety with mobility to allow discharge to the venue listed below.      Follow Up Recommendations Home health PT;SNF;Supervision/Assistance - 24 hour    Equipment Recommendations  None recommended by PT    Recommendations for Other Services       Precautions / Restrictions Precautions Precautions: Fall      Mobility  Bed Mobility Overal bed mobility: Needs Assistance Bed Mobility: Supine to Sit     Supine to sit: Mod assist     General bed mobility comments: extra time to mobilize, multimodal cues   Transfers Overall transfer level: Needs assistance Equipment used: Rolling walker (2 wheeled) Transfers: Sit to/from [facility] Sit to Stand: Mod assist;From elevated surface Stand pivot transfers: Mod assist;Max assist       General transfer comment: cues for standing, lifting assistance to stand, posterior lean tendency. Assistance to move the RW around.   Ambulation/Gait                Stairs            Wheelchair Mobility    Modified Rankin  (Stroke Patients Only)       Balance Overall balance assessment: Needs assistance Sitting-balance support: Feet supported;Bilateral upper extremity supported Sitting balance-Leahy Scale: Fair     Standing balance support: During functional activity;Bilateral upper extremity supported Standing balance-Leahy Scale: Poor                               Pertinent Vitals/Pain Pain Assessment: Faces Faces Pain Scale: No hurt    Home Living Family/patient expects to be discharged to:: Assisted living               Home Equipment: Walker - 2 wheels Additional Comments: has caregiver with patient who reports that patient ambulates with assistance w/ RW    Prior Function Level of Independence: Needs assistance   Gait / Transfers Assistance Needed: with all care           Hand Dominance        Extremity/Trunk Assessment               Lower Extremity Assessment: Generalized weakness         Communication      Cognition Arousal/Alertness: Lethargic Behavior During Therapy: WFL for tasks assessed/performed;Flat affect Overall Cognitive Status: History of cognitive impairments - at baseline Area of Impairment: Orientation Orientation Level: Person;Place;Situation;Time                  General Comments      Exercises        Assessment/Plan    PT Assessment Patient needs continued PT services  PT Diagnosis Difficulty walking;Generalized weakness;Altered mental status   PT Problem List Decreased strength;Decreased range of motion;Decreased activity tolerance;Decreased balance;Decreased mobility;Decreased knowledge of use of DME;Decreased safety awareness;Decreased knowledge of precautions;Decreased cognition  PT Treatment Interventions DME instruction;Gait training;Functional mobility training;Therapeutic activities;Therapeutic exercise;Patient/family education   PT Goals (Current goals can be found in the Care Plan section) Acute Rehab  PT Goals Patient Stated Goal: none stated PT Goal Formulation: Patient unable to participate in goal setting Time For Goal Achievement: 06/09/15 Potential to Achieve Goals: Fair    Frequency Min 2X/week   Barriers to discharge        Co-evaluation               End of Session Equipment Utilized During Treatment: Gait belt Activity Tolerance: Patient tolerated treatment well Patient left: in chair;with call bell/phone within reach;with chair alarm set;with nursing/sitter in room Nurse Communication: Mobility status         Time: [id number] PT Time Calculation (min) (ACUTE ONLY): 15 min   Charges:   PT Evaluation $PT Eval Low Complexity: 1 Procedure     PT G CodesClaretha Cooper 05/26/2015, 4:27 PM

## 2015-05-26 NOTE — Evaluation (Signed)
Clinical/Bedside Swallow Evaluation Patient Details  Name: Virginia Davidson MRN: [id number] Date of Birth: [birth year]  Today's Date: 05/26/2015 Time: SLP Start Time (ACUTE ONLY): 0830 SLP Stop Time (ACUTE ONLY): 0908 SLP Time Calculation (min) (ACUTE ONLY): 38 min  Past Medical History:  Past Medical History  Diagnosis Date  . Hypertension   . Thyroid disease   . Dementia   . Abdominal aneurysm ([place])   . Diverticulitis   . Glaucoma   . Urinary incontinence    Past Surgical History:  Past Surgical History  Procedure Laterality Date  . Bladder surgery N/A     x2  . Hip surgery Right    HPI:      Assessment / Plan / Recommendation Clinical Impression  Pt with grossly negative CN exam but does present with generalized weakness and sluggish palatal elevation.  She was xerostomic and benefit from oral moisture prior to po administration.  Initially pt unable to form labial seal on straw however after oral moisture she was able to achieve this.  No s/s of aspiration with po observed including water via straw, applesauce, moistened crackers.    Prolonged oral phase of swallow suspected with mild residuals of crackers requiring applesauce to clear.  Educated Actuary to strategy for dysphagia compensation. As pt wears dentures to eat normally, asked caregiver to contact family requesting dentures to be brought in for pt.    Pt is weak and has very weak, nonproductive cough and is therefore increased pulmonary ramification risk if she does aspirate.    Given h/o hiatal hernia also recommend to stay upright after meals for maximal airway protection. Educated caregiver and pt to findings and recommendations.   No follow up recommended as education completed.  Thanks for this referral.     Aspiration Risk  Moderate aspiration risk    Diet Recommendation Dysphagia 3 (Mech soft);Thin liquid (encouraged pt to consume only very soft items before dentures arrive)   Liquid Administration via:  Cup;Straw Medication Administration: Whole meds with puree Supervision: Staff to assist with self feeding (or caregiver ) Compensations: Slow rate;Small sips/bites (use pudding or puree to help clear food particles from mouth) Postural Changes: Seated upright at 90 degrees;Remain upright for at least 30 minutes after po intake    Other  Recommendations Oral Care Recommendations: Oral care BID (clean mouth after meals)   Follow up Recommendations  None    Frequency and Duration     n/a       Prognosis   n/a     Swallow Study   General Date of Onset: 05/23/15 Type of Study: Bedside Swallow Evaluation Diet Prior to this Study: Dysphagia 3 (soft);Thin liquids Temperature Spikes Noted: No Respiratory Status: Room air History of Recent Intubation: No Behavior/Cognition: Alert;Cooperative;Confused (flat affect) Oral Cavity Assessment: Dry Oral Care Completed by SLP: Yes Oral Cavity - Dentition: Dentures, not available Jeani Hawking contacted family requesting dentures be brought in per SLP request as she wears them for her meals) Vision: Functional for self-feeding Self-Feeding Abilities: Needs assist Patient Positioning: Upright in bed Baseline Vocal Quality: Low vocal intensity Volitional Cough: Congested;Other (Comment);Weak (nonproductive) Volitional Swallow: Unable to elicit    Oral/Motor/Sensory Function Overall Oral Motor/Sensory Function: Generalized oral weakness Lingual Strength: Reduced Lingual Sensation: Reduced Velum:  (sluggish)   Ice Chips Ice chips: Not tested   Thin Liquid Thin Liquid: Impaired Presentation: Cup;Self Fed;Straw Oral Phase Impairments: Reduced labial seal;Reduced lingual movement/coordination Oral Phase Functional Implications: Other (comment) (presumed delayed oral transiting) Pharyngeal  Phase Impairments:  Suspected delayed Swallow    Nectar Thick Nectar Thick Liquid: Not tested   Honey Thick Honey Thick Liquid: Not tested   Puree Puree:  Impaired Presentation: Self Fed;Spoon Oral Phase Impairments: Reduced lingual movement/coordination Oral Phase Functional Implications: Prolonged oral transit Pharyngeal Phase Impairments: Suspected delayed Swallow   Solid   GO   Solid: Impaired Presentation: Self Fed Oral Phase Impairments: Reduced labial seal;Reduced lingual movement/coordination;Impaired mastication Oral Phase Functional Implications: Prolonged oral transit Pharyngeal Phase Impairments: Suspected delayed Elonda Husky, [place] [facility] SLP [phone]

## 2015-05-26 NOTE — Progress Notes (Signed)
[facility] PROGRESS NOTE  Virginia Davidson [id number] DOB: October 14, 1916 DOA: 05/23/2015 PCP: Garnet Koyanagi, DO  Brief interval history Virginia Davidson is a 82 y.o. female with PMH of hypertension, GERD, anxiety, dementia, AAA, diverticulitis, glaucoma, urinary incontinence, recurrent UTI, who presents with productive cough, short of breath and altered mental status.  Per patient's son and daughter-in-law, patient has been confused in the past 2 days. She has productive cough, no chest pain. She also has fever, chills and decreased oral intake. Patient does not have diarrhea, abdominal pain, nausea, vomiting. She moves all extremities per family.  In ED, patient was found to have WBC 6.2, positive urinalysis with moderate amount of leukocytes, temperature 102.7, tachycardia, tachypnea, creatinine 1.02, negative chest x-ray for acute abnormalities, lactate 1.20.   Patient was admitted to the stepdown unit with acute respiratory failure felt to be secondary to probable healthcare associated pneumonia, possible influenza and UTI. Patient was placed empirically on Tamiflu and influenza PCR ordered which came back positive. Patient was also placed empirically on IV vancomycin IV history on them and pancultured. Cultures with no growth to date. Repeat chest x-ray done was negative for any acute infiltrate. IV antibiotics were discontinued. Patient remained afebrile transition to the regular floor. Patient also noted to have urinary retention and Foley catheter placed and patient started on Flomax.     Assessment/Plan: #1 acute respiratory failure secondary to influenza A/sepsis Patient with clinical improvement. Some improvement with cough and shortness of breath. Lungs clear on auscultation with no wheezing. Repeat chest x-ray negative. Sputum Gram stain and culture pending. Blood cultures pending. Tachycardia improved. Tachypnea improved. Patient currently afebrile. Patient with sats of 97% on room  air. Discontinued IV vancomycin. Discontinue IV aztreonam. Continue Tamiflu, IV fluids, Mucinex, nebulizer treatments.  #2 influenza A Clinical improvement. Continue Tamiflu, IV fluids, nebulizer treatments, supportive care.  #3 bacteria in urine Urine cultures negative. Discontinue IV aztreonam. Follow.  #4 acute encephalopathy Likely secondary to sepsis, influenza A, possible healthcare associated pneumonia, UTI. Clinical improvement. Continue IV fluids, supportive care, Tamiflu. Discontinue antibiotics.  #5 urinary retention Patient noted to have over 500 mL per bladder scan yesterday. Foley catheter has been placed. Continue Flomax. Patient will likely need a voiding trial tomorrow.   #6 hypertension Continue lisinopril.  #7 hypothyroidism TSH was 0.024. Synthroid dose has been decreased to 62.5 MCG's daily. Patient will likely need repeat thyroid function studies done in about 4-6 weeks.  #8 gastroesophageal reflux disease  Pepcid.  #9 dementia Continue home regimen Namenda.  #10 anxiety Xanax prn  #11 prophylaxis Pepcid for GI prophylaxis. Lovenox for DVT prophylaxis.  Code Status: DO NOT RESUSCITATE Family Communication: Updated patient, no family present. Disposition Plan: Home when medically stable, hopefully 1-2 days.   Consultants:  None  Procedures:  CXR 05/25/2015, 05/23/2015  Antibiotics:  IV Vancomycin 05/24/2015>>>>05/25/2015  IV Azactam 05/24/2015>>>>>05/25/2015  HPI/Subjective: Patient sleeping however easily arousable. No chest pain. No shortness of breath.   Objective: Filed Vitals:   05/25/15 2113 05/26/15 0641  BP: 164/80 141/65  Pulse: 79 66  Temp: 97.7 F (36.5 C) 98 F (36.7 C)  Resp: 19 19    Intake/Output Summary (Last 24 hours) at 05/26/15 1417 Last data filed at 05/26/15 0600  Gross per 24 hour  Intake   1150 ml  Output    600 ml  Net    550 ml   Filed Weights   05/23/15 1716 05/23/15 2200 05/24/15 0400  Weight: 57.38 kg  (126 lb 8 oz)  54 kg (119 lb 0.8 oz) 55.8 kg (123 lb 0.3 oz)    Exam:   General:  NAD  Cardiovascular: RRR  Respiratory: CTAB  Abdomen: Soft/NT/ND/+BS  Musculoskeletal: No c/c/e  Data Reviewed: Basic Metabolic Panel:  Recent Labs Lab 05/23/15 1820 05/24/15 0803 05/25/15 0352 05/26/15 0449  NA 140 140 140 139  K 4.0 3.4* 3.6 3.7  CL 105 112* 110 110  CO2 26 21* 23 25  GLUCOSE 150* 86 86 87  BUN 19 18 12 12   CREATININE 1.02* 0.78 0.70 0.57  CALCIUM 9.3 8.8* 9.0 8.8*  MG  --  1.6* 1.9 1.6*   Liver Function Tests:  Recent Labs Lab 05/23/15 1820  AST 23  ALT 11*  ALKPHOS 65  BILITOT 0.6  PROT 6.4*  ALBUMIN 3.6   No results for input(s): LIPASE, AMYLASE in the last 168 hours. No results for input(s): AMMONIA in the last 168 hours. CBC:  Recent Labs Lab 05/23/15 1820 05/24/15 0803 05/25/15 0352 05/26/15 0449  WBC 6.2 3.4* 3.1* 2.7*  NEUTROABS 4.9  --  1.6* 1.3*  HGB 15.6* 13.4 13.3 13.3  HCT 46.9* 42.0 40.8 40.7  MCV 93.6 96.1 93.6 94.2  PLT 150 132* 128* 152   Cardiac Enzymes: No results for input(s): CKTOTAL, CKMB, CKMBINDEX, TROPONINI in the last 168 hours. BNP (last 3 results) No results for input(s): BNP in the last 8760 hours.  ProBNP (last 3 results) No results for input(s): PROBNP in the last 8760 hours.  CBG: No results for input(s): GLUCAP in the last 168 hours.  Recent Results (from the past 240 hour(s))  Blood Culture (routine x 2)     Status: None (Preliminary result)   Collection Time: 05/23/15  6:15 PM  Result Value Ref Range Status   Specimen Description BLOOD LEFT AC  Final   Special Requests BOTTLES DRAWN AEROBIC AND ANAEROBIC 10CC EACH  Final   Culture   Final    NO GROWTH 2 DAYS Performed at [facility]    Report Status PENDING  Incomplete  Blood Culture (routine x 2)     Status: None (Preliminary result)   Collection Time: 05/23/15  6:20 PM  Result Value Ref Range Status   Specimen Description BLOOD RIGHT AC   Final   Special Requests BOTTLES DRAWN AEROBIC AND ANAEROBIC 5CC EACH  Final   Culture   Final    NO GROWTH 2 DAYS Performed at [facility]    Report Status PENDING  Incomplete  Urine culture     Status: None   Collection Time: 05/23/15  8:00 PM  Result Value Ref Range Status   Specimen Description URINE, CATHETERIZED  Final   Special Requests NONE  Final   Culture   Final    NO GROWTH 2 DAYS Performed at [facility]    Report Status 05/25/2015 FINAL  Final  Respiratory virus panel     Status: Abnormal   Collection Time: 05/24/15 12:06 AM  Result Value Ref Range Status   Respiratory Syncytial Virus A Negative Negative Final   Respiratory Syncytial Virus B Negative Negative Final   Influenza A Positive (A) Negative Final    Comment: Subtype: H3   Influenza B Negative Negative Final   Parainfluenza 1 Negative Negative Final   Parainfluenza 2 Negative Negative Final   Parainfluenza 3 Negative Negative Final   Metapneumovirus Negative Negative Final   Rhinovirus Negative Negative Final   Adenovirus Negative Negative Final    Comment: (  NOTE) Performed At: [facility] [place], [place] [id number] Lindon Romp MD [id number]   MRSA PCR Screening     Status: None   Collection Time: 05/24/15  8:19 AM  Result Value Ref Range Status   MRSA by PCR NEGATIVE NEGATIVE Final    Comment:        The GeneXpert MRSA Assay (FDA approved for NASAL specimens only), is one component of a comprehensive MRSA colonization surveillance program. It is not intended to diagnose MRSA infection nor to guide or monitor treatment for MRSA infections.      Studies: Dg Chest 2 View  05/25/2015  CLINICAL DATA:  .  Confusion, shortness of breath EXAM: CHEST  2 VIEW COMPARISON:  05/23/2015 and thoracic spine 10/02/2014 FINDINGS: Cardiomediastinal silhouette is stable. No infiltrate or pulmonary edema. Moderate size hiatal hernia again noted. Trace  bilateral pleural effusion with bilateral basilar posterior atelectasis. There is moderate compression deformity lower thoracic spine with progression from prior exam. Stable chronic interstitial prominence. IMPRESSION: 1. No infiltrate or pulmonary edema. Moderate size hiatal hernia again noted. Trace pleural effusion with posterior atelectasis. Moderate compression deformity lower thoracic spine with progression from prior exam. Electronically Signed   By: Lahoma Crocker M.D.   On: 05/25/2015 09:23    Scheduled Meds: . acidophilus  1 capsule Oral Daily  . calcium-vitamin D  1 tablet Oral BID WC  . dextromethorphan-guaiFENesin  1 tablet Oral BID  . enoxaparin (LOVENOX) injection  30 mg Subcutaneous QHS  . famotidine  10 mg Oral Daily  . levothyroxine  62.5 mcg Oral QAC breakfast  . lisinopril  10 mg Oral Daily  . loratadine  10 mg Oral Daily  . memantine  7 mg Oral Daily  . oseltamivir  30 mg Oral BID  . tamsulosin  0.4 mg Oral QPC supper   Continuous Infusions: . sodium chloride 75 mL/hr at 05/25/15 2000    Principal Problem:   Acute respiratory failure (HCC) Active Problems:   HCAP (healthcare-associated pneumonia)   Influenza A (H1N1)   Essential hypertension, benign   Hypothyroidism   GERD (gastroesophageal reflux disease)   Dementia   Anxiety   UTI (urinary tract infection)   Sepsis ([place])   Urinary retention    Time spent: 27 mins    [employer] MD [facility] Pager [phone]. If 7PM-7AM, please contact night-coverage at [email or web address], password [facility] 05/26/2015, 2:17 PM  LOS: 3 days

## 2015-05-27 DIAGNOSIS — R339 Retention of urine, unspecified: Secondary | ICD-10-CM

## 2015-05-27 DIAGNOSIS — I10 Essential (primary) hypertension: Secondary | ICD-10-CM

## 2015-05-27 DIAGNOSIS — J101 Influenza due to other identified influenza virus with other respiratory manifestations: Secondary | ICD-10-CM

## 2015-05-27 LAB — BASIC METABOLIC PANEL
Anion gap: 4 — ABNORMAL LOW (ref 5–15)
BUN: 12 mg/dL (ref 6–20)
CHLORIDE: 110 mmol/L (ref 101–111)
CO2: 24 mmol/L (ref 22–32)
Calcium: 8.9 mg/dL (ref 8.9–10.3)
Creatinine, Ser: 0.72 mg/dL (ref 0.44–1.00)
GFR calc non Af Amer: >60 mL/min (ref >60)
Glucose, Bld: 89 mg/dL (ref 65–99)
POTASSIUM: 3.8 mmol/L (ref 3.5–5.1)
SODIUM: 138 mmol/L (ref 135–145)

## 2015-05-27 LAB — CBC
HCT: 38.4 % (ref 36.0–46.0)
Hemoglobin: 12.8 g/dL (ref 12.0–15.0)
MCH: 30.9 pg (ref 26.0–34.0)
MCHC: 33.3 g/dL (ref 30.0–36.0)
MCV: 92.8 fL (ref 78.0–100.0)
PLATELETS: 129 10*3/uL — AB (ref 150–400)
RBC: 4.14 MIL/uL (ref 3.87–5.11)
RDW: 14.6 % (ref 11.5–15.5)
WBC: 2.5 10*3/uL — AB (ref 4.0–10.5)

## 2015-05-27 LAB — MAGNESIUM: Magnesium: 2 mg/dL (ref 1.7–2.4)

## 2015-05-27 NOTE — Progress Notes (Signed)
[facility] PROGRESS NOTE  Deauna Rizzotti [id number] DOB: 09-08-1916 DOA: 05/23/2015 PCP: Garnet Koyanagi, DO  Brief interval history Virginia Davidson is a 82 y.o. female with PMH of hypertension, GERD, anxiety, dementia, AAA, diverticulitis, glaucoma, urinary incontinence, recurrent UTI, who presents with productive cough, short of breath and altered mental status.  Per patient's son and daughter-in-law, patient has been confused in the past 2 days. She has productive cough, no chest pain. She also has fever, chills and decreased oral intake. Patient does not have diarrhea, abdominal pain, nausea, vomiting. She moves all extremities per family.  In ED, patient was found to have WBC 6.2, positive urinalysis with moderate amount of leukocytes, temperature 102.7, tachycardia, tachypnea, creatinine 1.02, negative chest x-ray for acute abnormalities, lactate 1.20.   Patient was admitted to the stepdown unit with acute respiratory failure felt to be secondary to probable healthcare associated pneumonia, possible influenza and UTI. Patient was placed empirically on Tamiflu and influenza PCR ordered which came back positive. Patient was also placed empirically on IV vancomycin IV history on them and pancultured. Cultures with no growth to date. Repeat chest x-ray done was negative for any acute infiltrate. IV antibiotics were discontinued. Patient remained afebrile transition to the regular floor. Patient also noted to have urinary retention and Foley catheter placed and patient started on Flomax.     Assessment/Plan: #1 acute respiratory failure secondary to influenza A/sepsis Patient with clinical improvement. Some improvement with cough and shortness of breath. Lungs clear on auscultation with no wheezing. Repeat chest x-ray negative. Sputum Gram stain and culture pending. Blood cultures pending. Tachycardia improved. Tachypnea improved. Patient currently afebrile. Patient with sats of 97% on room  air. Discontinued IV vancomycin. Discontinue IV aztreonam. Continue Tamiflu, IV fluids, Mucinex, nebulizer treatments. -On 05/27/2015 patient showing clinical stability. He remains afebrile, nontoxic.  #2 influenza A Clinical improvement. Continue Tamiflu, IV fluids, nebulizer treatments, supportive care.  #3 bacteria in urine Urine cultures negative.   #4 acute encephalopathy Likely secondary to sepsis, influenza A, possible healthcare associated pneumonia, UTI. Clinical improvement. Continue IV fluids, supportive care, Tamiflu. Discontinue antibiotics.  #5 urinary retention -Foley catheter discontinued on 05/27/2015, repeating bladder scanning. Discharge in her on an indwelling Foley catheter would be suboptimal this patient particularly have a history of recurrent urinary tract infections. Hopefully this will be necessary.  #6 hypertension Continue lisinopril.  #7 hypothyroidism TSH was 0.024. Synthroid dose has been decreased to 62.5 MCG's daily. Patient will likely need repeat thyroid function studies done in about 4-6 weeks.  #8 gastroesophageal reflux disease  Pepcid.  #9 dementia Continue home regimen Namenda.  #10 anxiety Xanax prn  #11 prophylaxis Pepcid for GI prophylaxis. Lovenox for DVT prophylaxis.  Code Status: DO NOT RESUSCITATE Family Communication: Spoke with her daughter-in-law over telephone conversation Disposition Plan: Home when medically stable, this patient discharged home in 24 hours if she remains stable   Consultants:  None  Procedures:  CXR 05/25/2015, 05/23/2015  Antibiotics:  IV Vancomycin 05/24/2015>>>>05/25/2015  IV Azactam 05/24/2015>>>>>05/25/2015  HPI/Subjective: She is in no acute distress, sleeping however arousable.   Objective: Filed Vitals:   05/26/15 2228 05/27/15 0608  BP: 141/84 132/69  Pulse: 90 67  Temp: 97 F (36.1 C) 98 F (36.7 C)  Resp: 16 16    Intake/Output Summary (Last 24 hours) at 05/27/15 1344 Last  data filed at 05/27/15 0640  Gross per 24 hour  Intake 1521.67 ml  Output   1550 ml  Net -28.33 ml   [employer]  05/23/15 1716 05/23/15 2200 05/24/15 0400  Weight: 57.38 kg (126 lb 8 oz) 54 kg (119 lb 0.8 oz) 55.8 kg (123 lb 0.3 oz)    Exam:   General:  NAD  Cardiovascular: RRR  Respiratory: CTAB  Abdomen: Soft/NT/ND/+BS  Musculoskeletal: No c/c/e  Data Reviewed: Basic Metabolic Panel:  Recent Labs Lab 05/23/15 1820 05/24/15 0803 05/25/15 0352 05/26/15 0449 05/27/15 0501  NA 140 140 140 139 138  K 4.0 3.4* 3.6 3.7 3.8  CL 105 112* 110 110 110  CO2 26 21* 23 25 24   GLUCOSE 150* 86 86 87 89  BUN 19 18 12 12 12   CREATININE 1.02* 0.78 0.70 0.57 0.72  CALCIUM 9.3 8.8* 9.0 8.8* 8.9  MG  --  1.6* 1.9 1.6* 2.0   Liver Function Tests:  Recent Labs Lab 05/23/15 1820  AST 23  ALT 11*  ALKPHOS 65  BILITOT 0.6  PROT 6.4*  ALBUMIN 3.6   No results for input(s): LIPASE, AMYLASE in the last 168 hours. No results for input(s): AMMONIA in the last 168 hours. CBC:  Recent Labs Lab 05/23/15 1820 05/24/15 0803 05/25/15 0352 05/26/15 0449 05/27/15 0501  WBC 6.2 3.4* 3.1* 2.7* 2.5*  NEUTROABS 4.9  --  1.6* 1.3*  --   HGB 15.6* 13.4 13.3 13.3 12.8  HCT 46.9* 42.0 40.8 40.7 38.4  MCV 93.6 96.1 93.6 94.2 92.8  PLT 150 132* 128* 152 129*   Cardiac Enzymes: No results for input(s): CKTOTAL, CKMB, CKMBINDEX, TROPONINI in the last 168 hours. BNP (last 3 results) No results for input(s): BNP in the last 8760 hours.  ProBNP (last 3 results) No results for input(s): PROBNP in the last 8760 hours.  CBG: No results for input(s): GLUCAP in the last 168 hours.  Recent Results (from the past 240 hour(s))  Blood Culture (routine x 2)     Status: None (Preliminary result)   Collection Time: 05/23/15  6:15 PM  Result Value Ref Range Status   Specimen Description BLOOD LEFT AC  Final   Special Requests BOTTLES DRAWN AEROBIC AND ANAEROBIC 10CC EACH  Final   Culture    Final    NO GROWTH 3 DAYS Performed at [facility]    Report Status PENDING  Incomplete  Blood Culture (routine x 2)     Status: None (Preliminary result)   Collection Time: 05/23/15  6:20 PM  Result Value Ref Range Status   Specimen Description BLOOD RIGHT AC  Final   Special Requests BOTTLES DRAWN AEROBIC AND ANAEROBIC 5CC EACH  Final   Culture   Final    NO GROWTH 3 DAYS Performed at [facility]    Report Status PENDING  Incomplete  Urine culture     Status: None   Collection Time: 05/23/15  8:00 PM  Result Value Ref Range Status   Specimen Description URINE, CATHETERIZED  Final   Special Requests NONE  Final   Culture   Final    NO GROWTH 2 DAYS Performed at [facility]    Report Status 05/25/2015 FINAL  Final  Respiratory virus panel     Status: Abnormal   Collection Time: 05/24/15 12:06 AM  Result Value Ref Range Status   Respiratory Syncytial Virus A Negative Negative Final   Respiratory Syncytial Virus B Negative Negative Final   Influenza A Positive (A) Negative Final    Comment: Subtype: H3   Influenza B Negative Negative Final   Parainfluenza 1 Negative Negative Final  Parainfluenza 2 Negative Negative Final   Parainfluenza 3 Negative Negative Final   Metapneumovirus Negative Negative Final   Rhinovirus Negative Negative Final   Adenovirus Negative Negative Final    Comment: (NOTE) Performed At: [facility] [address] [place], [place] [id number] Lindon Romp MD [id number]   MRSA PCR Screening     Status: None   Collection Time: 05/24/15  8:19 AM  Result Value Ref Range Status   MRSA by PCR NEGATIVE NEGATIVE Final    Comment:        The GeneXpert MRSA Assay (FDA approved for NASAL specimens only), is one component of a comprehensive MRSA colonization surveillance program. It is not intended to diagnose MRSA infection nor to guide or monitor treatment for MRSA infections.      Studies: No results  found.  Scheduled Meds: . acidophilus  1 capsule Oral Daily  . calcium-vitamin D  1 tablet Oral BID WC  . dextromethorphan-guaiFENesin  1 tablet Oral BID  . enoxaparin (LOVENOX) injection  30 mg Subcutaneous QHS  . famotidine  10 mg Oral Daily  . levothyroxine  62.5 mcg Oral QAC breakfast  . lisinopril  10 mg Oral Daily  . loratadine  10 mg Oral Daily  . memantine  7 mg Oral Daily  . oseltamivir  30 mg Oral BID  . tamsulosin  0.4 mg Oral QPC supper   Continuous Infusions: . sodium chloride 50 mL/hr at 05/26/15 2137    Principal Problem:   Acute respiratory failure (HCC) Active Problems:   Essential hypertension, benign   Hypothyroidism   GERD (gastroesophageal reflux disease)   Dementia   Anxiety   UTI (urinary tract infection)   Sepsis ([place])   HCAP (healthcare-associated pneumonia)   Influenza A (H1N1)   Urinary retention    Time spent: 30 mins    Kelvin Cellar MD [facility] Pager [phone]. If 7PM-7AM, please contact night-coverage at [email or web address], password [facility] 05/27/2015, 1:44 PM  LOS: 4 days

## 2015-05-28 DIAGNOSIS — J9601 Acute respiratory failure with hypoxia: Secondary | ICD-10-CM

## 2015-05-28 LAB — CULTURE, BLOOD (ROUTINE X 2)
CULTURE: NO GROWTH
CULTURE: NO GROWTH

## 2015-05-28 MED ORDER — OSELTAMIVIR PHOSPHATE 30 MG PO CAPS: 30.0000 mg | ORAL_CAPSULE | Freq: Two times a day (BID) | ORAL | Status: DC

## 2015-05-28 NOTE — Discharge Summary (Signed)
Physician Discharge Summary  Virginia Davidson [id number] DOB: 01-26-1917 DOA: 05/23/2015  PCP: Garnet Koyanagi, DO  Admit date: 05/23/2015 Discharge date: 05/28/2015  Time spent: 35 minutes  Recommendations for Outpatient Follow-up:  1. Virginia Davidson was admitted and treated for flu. Discharged home with family and caregivers.    Discharge Diagnoses:  Principal Problem:   Acute respiratory failure ([place]) Active Problems:   Essential hypertension, benign   Hypothyroidism   GERD (gastroesophageal reflux disease)   Dementia   Anxiety   UTI (urinary tract infection)   Sepsis ([place])   HCAP (healthcare-associated pneumonia)   Influenza A (H1N1)   Urinary retention   Discharge Condition: Stable  Diet recommendation: Regular diet  Filed Weights   05/23/15 1716 05/23/15 2200 05/24/15 0400  Weight: 57.38 kg (126 lb 8 oz) 54 kg (119 lb 0.8 oz) 55.8 kg (123 lb 0.3 oz)    History of present illness:  Virginia Davidson is a 82 y.o. female with PMH of hypertension, GERD, anxiety, dementia, AAA, diverticulitis, glaucoma, urinary incontinence, recurrent UTI, who presents with productive cough, short of breath and altered mental status.  Per patient's son and daughter-in-law, patient has been confused in the past 2 days. She has productive cough, no chest pain. She also has fever, chills and decreased oral intake. Patient does not have diarrhea, abdominal pain, nausea, vomiting. She moves all extremities per family.  In ED, patient was found to have WBC 6.2, positive urinalysis with moderate amount of leukocytes, temperature 102.7, tachycardia, tachypnea, creatinine 1.02, negative chest x-ray for acute abnormalities, lactate 1.20. Patient is admitted to inpatient for foot infection treatment.  Hospital Course:  #1 Acute respiratory failure secondary to influenza  -Virginia Davidson was initially admitted to the step down unit with acute respiratory failure. She was initially treated with broad-spectrum  IV antimicrobial therapy with IV aztreonam and vancomycin. She was also started on Tamiflu given high suspicion for influenza.  - Repeat chest x-ray negative. Blood cultures and sputum cultures negative. -Flu swab came back positive for influenza A. IV aztreonam and vancomycin were discontinued. -She showed gradual clinical improvement and by 05/28/2015 was functioning at her baseline. She did not require supplemental oxygen.  #2 Influenza A - showing clinical improvement, plan to continue 5 day course of Tamiflu.  #3 Acute encephalopathy Patient presenting with mental status changes, functional decline, likely precipitated by multiple factors including dehydration and infectious process. By day of discharge she was functioning at her baseline.  #4 urinary retention -Foley catheter discontinued on 05/27/2015, repeating bladder scanning. Discharge in her on an indwelling Foley catheter would be suboptimal this patient particularly have a history of recurrent urinary tract infections. Hopefully this will be necessary.  #6 hypertension Continue lisinopril.  #7 hypothyroidism TSH was 0.024. Synthroid dose has been decreased to 62.5 MCG's daily. Patient will likely need repeat thyroid function studies done in about 4-6 weeks.  #8 gastroesophageal reflux disease  Pepcid.  #9 dementia Continue home regimen Namenda   Discharge Exam: Filed Vitals:   05/28/15 0700 05/28/15 1056  BP: 160/78 141/81  Pulse: 66   Temp:    Resp: 18     General: Patient is calm, pleasant in no acute distress sitting up having breakfast Cardiovascular: Regular rate and rhythm normal S1 is Respiratory: Normal respiratory effort, lungs clear to auscultation bilaterally Abdomen:Soft nontender nondistended  Discharge Instructions   Discharge Instructions    Call MD for:  difficulty breathing, headache or visual disturbances    Complete by:  As directed  Call MD for:  extreme fatigue    Complete by:  As  directed      Call MD for:  hives    Complete by:  As directed      Call MD for:  persistant dizziness or light-headedness    Complete by:  As directed      Call MD for:  persistant nausea and vomiting    Complete by:  As directed      Call MD for:  redness, tenderness, or signs of infection (pain, swelling, redness, odor or green/yellow discharge around incision site)    Complete by:  As directed      Call MD for:  severe uncontrolled pain    Complete by:  As directed      Call MD for:  temperature >100.4    Complete by:  As directed      Call MD for:    Complete by:  As directed      Diet - low sodium heart healthy    Complete by:  As directed      Increase activity slowly    Complete by:  As directed           Discharge Medication List as of 05/28/2015 12:00 PM    START taking these medications   Details  oseltamivir (TAMIFLU) 30 MG capsule Take 1 capsule (30 mg total) by mouth 2 (two) times daily., Starting 05/28/2015, Until Discontinued, Print      CONTINUE these medications which have NOT CHANGED   Details  ALPRAZolam (XANAX) 0.25 MG tablet TAKE ONE TABLET 3 TIMES A DAY AS NEEDED., Print    Calcium Carbonate-Vitamin D 600-200 MG-UNIT CAPS Take 1 tablet by mouth 2 (two) times daily., Until Discontinued, Historical Med    dextromethorphan (DELSYM) 30 MG/5ML liquid Take 30 mg by mouth as needed for cough., Until Discontinued, Historical Med    fexofenadine (ALLEGRA) 180 MG tablet Take 180 mg by mouth daily., Until Discontinued, Historical Med    Lactobacillus Rhamnosus, GG, (CULTURELLE PO) Take 1 capsule by mouth daily. , Until Discontinued, Historical Med    levothyroxine (SYNTHROID, LEVOTHROID) 75 MCG tablet Take 1 tablet (75 mcg total) by mouth daily., Starting March 08, 202017, Until Discontinued, Normal    lisinopril (PRINIVIL,ZESTRIL) 10 MG tablet TAKE ONE (1) TABLET BY MOUTH EVERY DAY, Normal    memantine (NAMENDA XR) 7 MG CP24 24 hr capsule Take 1 capsule (7 mg total) by  mouth daily., Starting 04/13/2015, Until Discontinued, Normal    ranitidine (ZANTAC) 150 MG tablet Take 1 tablet (150 mg total) by mouth 2 (two) times daily., Starting 04/13/2015, Until Discontinued, Normal    nitroGLYCERIN (NITROSTAT) 0.4 MG SL tablet Place 0.4 mg under the tongue every 5 (five) minutes as needed for chest pain., Until Discontinued, Historical Med    ranitidine (ZANTAC) 150 MG/10ML syrup Take 10 mLs (150 mg total) by mouth 2 (two) times daily., Starting 04/13/2015, Until Discontinued, Normal       Allergies  Allergen Reactions  . Penicillins Rash    Family is not sure of reaction as far as breathing issues are concerned  . Sulfamethoxazole Rash  . Alendronate Sodium     Unknown Reaction per caregiver  . Tetracyclines & Related     Unknown Reaction per caregiver   Follow-up Information    Follow up with Garnet Koyanagi, DO In 2 weeks.   Specialty:  Family Medicine   Contact information:   [address] [place]  [place] [address] [phone]        The results of significant diagnostics from this hospitalization (including imaging, microbiology, ancillary and laboratory) are listed below for reference.    Significant Diagnostic Studies: Dg Chest 2 View  05/25/2015  CLINICAL DATA:  .  Confusion, shortness of breath EXAM: CHEST  2 VIEW COMPARISON:  05/23/2015 and thoracic spine 10/02/2014 FINDINGS: Cardiomediastinal silhouette is stable. No infiltrate or pulmonary edema. Moderate size hiatal hernia again noted. Trace bilateral pleural effusion with bilateral basilar posterior atelectasis. There is moderate compression deformity lower thoracic spine with progression from prior exam. Stable chronic interstitial prominence. IMPRESSION: 1. No infiltrate or pulmonary edema. Moderate size hiatal hernia again noted. Trace pleural effusion with posterior atelectasis. Moderate compression deformity lower thoracic spine with progression from prior exam. Electronically  Signed   By: Lahoma Crocker M.D.   On: 05/25/2015 09:23   Dg Chest Port 1 View  05/23/2015  CLINICAL DATA:  Cough.  Fever. EXAM: PORTABLE CHEST 1 VIEW COMPARISON:  June 21, 2014 FINDINGS: No pneumothorax. There appears to be a moderate to large hiatal hernia. The heart, hila, and mediastinum are otherwise unremarkable. No pulmonary nodules, masses, or infiltrates. No overt pulmonary edema. IMPRESSION: No active disease. Electronically Signed   By: Dorise Bullion III M.D   On: 05/23/2015 18:34    Microbiology: Recent Results (from the past 240 hour(s))  Blood Culture (routine x 2)     Status: None   Collection Time: 05/23/15  6:15 PM  Result Value Ref Range Status   Specimen Description BLOOD LEFT AC  Final   Special Requests BOTTLES DRAWN AEROBIC AND ANAEROBIC 10CC EACH  Final   Culture   Final    NO GROWTH 5 DAYS Performed at [facility]    Report Status 05/28/2015 FINAL  Final  Blood Culture (routine x 2)     Status: None   Collection Time: 05/23/15  6:20 PM  Result Value Ref Range Status   Specimen Description BLOOD RIGHT AC  Final   Special Requests BOTTLES DRAWN AEROBIC AND ANAEROBIC 5CC EACH  Final   Culture   Final    NO GROWTH 5 DAYS Performed at [facility]    Report Status 05/28/2015 FINAL  Final  Urine culture     Status: None   Collection Time: 05/23/15  8:00 PM  Result Value Ref Range Status   Specimen Description URINE, CATHETERIZED  Final   Special Requests NONE  Final   Culture   Final    NO GROWTH 2 DAYS Performed at [facility]    Report Status 05/25/2015 FINAL  Final  Respiratory virus panel     Status: Abnormal   Collection Time: 05/24/15 12:06 AM  Result Value Ref Range Status   Respiratory Syncytial Virus A Negative Negative Final   Respiratory Syncytial Virus B Negative Negative Final   Influenza A Positive (A) Negative Final    Comment: Subtype: H3   Influenza B Negative Negative Final   Parainfluenza 1 Negative Negative  Final   Parainfluenza 2 Negative Negative Final   Parainfluenza 3 Negative Negative Final   Metapneumovirus Negative Negative Final   Rhinovirus Negative Negative Final   Adenovirus Negative Negative Final    Comment: (NOTE) Performed At: [facility] [place], [place] [id number] Lindon Romp MD [id number]   MRSA PCR Screening     Status: None   Collection Time: 05/24/15  8:19 AM  Result Value Ref Range Status  MRSA by PCR NEGATIVE NEGATIVE Final    Comment:        The GeneXpert MRSA Assay (FDA approved for NASAL specimens only), is one component of a comprehensive MRSA colonization surveillance program. It is not intended to diagnose MRSA infection nor to guide or monitor treatment for MRSA infections.      Labs: Basic Metabolic Panel:  Recent Labs Lab 05/23/15 1820 05/24/15 0803 05/25/15 0352 05/26/15 0449 05/27/15 0501  NA 140 140 140 139 138  K 4.0 3.4* 3.6 3.7 3.8  CL 105 112* 110 110 110  CO2 26 21* 23 25 24   GLUCOSE 150* 86 86 87 89  BUN 19 18 12 12 12   CREATININE 1.02* 0.78 0.70 0.57 0.72  CALCIUM 9.3 8.8* 9.0 8.8* 8.9  MG  --  1.6* 1.9 1.6* 2.0   Liver Function Tests:  Recent Labs Lab 05/23/15 1820  AST 23  ALT 11*  ALKPHOS 65  BILITOT 0.6  PROT 6.4*  ALBUMIN 3.6   No results for input(s): LIPASE, AMYLASE in the last 168 hours. No results for input(s): AMMONIA in the last 168 hours. CBC:  Recent Labs Lab 05/23/15 1820 05/24/15 0803 05/25/15 0352 05/26/15 0449 05/27/15 0501  WBC 6.2 3.4* 3.1* 2.7* 2.5*  NEUTROABS 4.9  --  1.6* 1.3*  --   HGB 15.6* 13.4 13.3 13.3 12.8  HCT 46.9* 42.0 40.8 40.7 38.4  MCV 93.6 96.1 93.6 94.2 92.8  PLT 150 132* 128* 152 129*   Cardiac Enzymes: No results for input(s): CKTOTAL, CKMB, CKMBINDEX, TROPONINI in the last 168 hours. BNP: BNP (last 3 results) No results for input(s): BNP in the last 8760 hours.  ProBNP (last 3 results) No results for input(s): PROBNP in the  last 8760 hours.  CBG: No results for input(s): GLUCAP in the last 168 hours.     Signed:  Kelvin Cellar MD.  [facility] 05/28/2015, 1:35 PM

## 2015-05-28 NOTE — Progress Notes (Signed)
Pt discharged from the unit via wheelchair. Discharge instructions were reviewed with the family members. Pt belongings were sent home with family members. 24 hour care already set up with family. No questions or concerns from the pt/family at this time.   Harmoni Lucus W [place] Marian, RN

## 2015-05-29 ENCOUNTER — Telehealth: Payer: Self-pay | Admitting: Behavioral Health

## 2015-05-29 NOTE — Telephone Encounter (Signed)
Attempted to reach patient's family/caregivers (son and daughter-in-law) for TCM/Hospital Follow-up. Left message for a callback when available.

## 2015-06-01 NOTE — Telephone Encounter (Signed)
Transition Care Management Follow-up Telephone Call  PCP: Virginia Koyanagi, DO  Admit date: 05/23/2015 Discharge date: 05/28/2015  Recommendations for Outpatient Follow-up:  1. Mrs. Virginia Davidson was admitted and treated for flu. Discharged home with family and caregivers.  Discharge Diagnoses:  Principal Problem:  Acute respiratory failure ([place]) Active Problems:  Essential hypertension, benign  Hypothyroidism  GERD (gastroesophageal reflux disease)  Dementia  Anxiety  UTI (urinary tract infection)  Sepsis (HCC)  HCAP (healthcare-associated pneumonia)  Influenza A (H1N1)  Urinary retention   Discharge Condition: Stable    How have you been since you were released from the hospital? Per the patient's daughter-in-law, "She's doing okay, but seems to still have a little cough".   Do you understand why you were in the hospital? yes   Do you understand the discharge instructions? yes   Where were you discharged to? Per the daughter-in-law, patient went back to her independent living facility.   Items Reviewed:  Medications reviewed: yes  Allergies reviewed: yes  Dietary changes reviewed: yes, low sodium diet  Referrals reviewed: None   Functional Questionnaire:   Activities of Daily Living (ADLs):   She states they are independent in the following: ambulation and feeding States they require assistance with the following: bathing and hygiene, continence, grooming, toileting and dressing; patient has sitters and close relatives to help assist her each day.   Any transportation issues/concerns?: no   Any patient concerns? yes, per the daughter-in-law, the patient's blood pressure has been running higher than normal. Last B/P reading was 144/75 and P 72 on yesterday. Also, she voiced concern that the patient still has a cough.   Confirmed importance and date/time of follow-up visits scheduled yes, 06/04/15 at 11:30 AM.  Provider Appointment booked with Dr.  Etter Davidson.  Confirmed with patient if condition begins to worsen call PCP or go to the ER.  Patient was given the office number and encouraged to call back with question or concerns.  : yes

## 2015-06-01 NOTE — Addendum Note (Signed)
Addended by: Kathlen Brunswick on: 06/01/2015 04:38 PM   Modules accepted: Orders, Medications

## 2015-06-04 ENCOUNTER — Ambulatory Visit (HOSPITAL_BASED_OUTPATIENT_CLINIC_OR_DEPARTMENT_OTHER)
Admission: RE | Admit: 2015-06-04 | Discharge: 2015-06-04 | Disposition: A | Discharge status: Home / Self Care | Payer: Medicare Other | Source: Ambulatory Visit | Attending: Family Medicine | Admitting: Family Medicine

## 2015-06-04 ENCOUNTER — Other Ambulatory Visit: Payer: Self-pay

## 2015-06-04 ENCOUNTER — Telehealth: Payer: Self-pay

## 2015-06-04 ENCOUNTER — Encounter: Payer: Self-pay | Admitting: Family Medicine

## 2015-06-04 ENCOUNTER — Ambulatory Visit (INDEPENDENT_AMBULATORY_CARE_PROVIDER_SITE_OTHER): Payer: Medicare Other | Admitting: Family Medicine

## 2015-06-04 VITALS — BP 124/76 | HR 78 | Temp 97.8°F | Wt 113.0 lb

## 2015-06-04 DIAGNOSIS — E039 Hypothyroidism, unspecified: Secondary | ICD-10-CM | POA: Diagnosis not present

## 2015-06-04 DIAGNOSIS — R05 Cough: Secondary | ICD-10-CM | POA: Diagnosis not present

## 2015-06-04 DIAGNOSIS — K449 Diaphragmatic hernia without obstruction or gangrene: Secondary | ICD-10-CM | POA: Insufficient documentation

## 2015-06-04 DIAGNOSIS — R059 Cough, unspecified: Secondary | ICD-10-CM

## 2015-06-04 DIAGNOSIS — R195 Other fecal abnormalities: Secondary | ICD-10-CM | POA: Diagnosis not present

## 2015-06-04 LAB — CBC WITH DIFFERENTIAL/PLATELET
BASOS PCT: 0.4 % (ref 0.0–3.0)
Basophils Absolute: 0 10*3/uL (ref 0.0–0.1)
EOS PCT: 2.9 % (ref 0.0–5.0)
Eosinophils Absolute: 0.2 10*3/uL (ref 0.0–0.7)
HCT: 43.4 % (ref 36.0–46.0)
HEMOGLOBIN: 14.3 g/dL (ref 12.0–15.0)
LYMPHS ABS: 0.7 10*3/uL (ref 0.7–4.0)
Lymphocytes Relative: 10.3 % — ABNORMAL LOW (ref 12.0–46.0)
MCHC: 33.1 g/dL (ref 30.0–36.0)
MCV: 92.7 fl (ref 78.0–100.0)
MONO ABS: 0.9 10*3/uL (ref 0.1–1.0)
MONOS PCT: 13.4 % — AB (ref 3.0–12.0)
NEUTROS PCT: 73 % (ref 43.0–77.0)
Neutro Abs: 5.2 10*3/uL (ref 1.4–7.7)
Platelets: 251 10*3/uL (ref 150.0–400.0)
RBC: 4.68 Mil/uL (ref 3.87–5.11)
RDW: 15.9 % — AB (ref 11.5–15.5)
WBC: 7.1 10*3/uL (ref 4.0–10.5)

## 2015-06-04 LAB — COMPREHENSIVE METABOLIC PANEL
ALBUMIN: 3.5 g/dL (ref 3.5–5.2)
ALK PHOS: 56 U/L (ref 39–117)
ALT: 11 U/L (ref 0–35)
AST: 16 U/L (ref 0–37)
BILIRUBIN TOTAL: 0.5 mg/dL (ref 0.2–1.2)
BUN: 19 mg/dL (ref 6–23)
CO2: 29 mEq/L (ref 19–32)
CREATININE: 0.91 mg/dL (ref 0.40–1.20)
Calcium: 9.8 mg/dL (ref 8.4–10.5)
Chloride: 109 mEq/L (ref 96–112)
GFR: 60.78 mL/min (ref >60.00)
Glucose, Bld: 84 mg/dL (ref 70–99)
POTASSIUM: 3.7 meq/L (ref 3.5–5.1)
SODIUM: 144 meq/L (ref 135–145)
TOTAL PROTEIN: 5.8 g/dL — AB (ref 6.0–8.3)

## 2015-06-04 LAB — THYROID PANEL WITH TSH
Free Thyroxine Index: 3.5 (ref 1.4–3.8)
T3 Uptake: 34 % (ref 22–35)
T4 TOTAL: 10.4 ug/dL (ref 4.5–12.0)
TSH: 0.65 mIU/L

## 2015-06-04 MED ORDER — AZITHROMYCIN 250 MG PO TABS: ORAL_TABLET | ORAL | Status: DC

## 2015-06-04 NOTE — Telephone Encounter (Signed)
[facility] note received via fax  Call:   Date: 05/31/15 Time: 9:41:45am   Caller:  Patient Return number:  [id number]  Nurse: Susanne Greenhouse, RN  Chief Complaint:  Cough  Reason for call:  Caller reports patient continues to have a cough since hospitalized is wanting a medication to shut cough off.  Is not with patient.  Is on BP medications.    Related visit to physician within the last 2 weeks:  No  ----Caller advised she can take med list to pharmacy and see what can be recommended for cough until can consult with PCP, caller reported have recommended cough med that began with D in past.  Advised of cough med Delsym but should avoid using all day for cough until consulting with MD.  Virginia Davidson verbalized understanding.

## 2015-06-04 NOTE — Progress Notes (Signed)
Pre visit review using our clinic review tool, if applicable. No additional management support is needed unless otherwise documented below in the visit note. 

## 2015-06-04 NOTE — Assessment & Plan Note (Signed)
Recently over flu Still cough Check cxr

## 2015-06-04 NOTE — Assessment & Plan Note (Signed)
Recheck labs Will adjust synthroid prn

## 2015-06-04 NOTE — Telephone Encounter (Signed)
Pt scheduled to see Dr. Etter Sjogren today (06/04/15) at 11:30 am.

## 2015-06-04 NOTE — Progress Notes (Signed)
Patient ID: Madi Bonfiglio, female    DOB: 05/01/1916  Age: 82 y.o. MRN: [id number]    Subjective:  Subjective HPI Daysie Helf presents for f/u from ER -- she was dx with flu and was d/c.   Caretakers are with pt.  Daughter in law not able to come because of back injury.  Family is worried about her weight loss but she is not drinking protein consistently. Pt occasionally has diarrhea but tsh has been abnormal and has not been rechecked.    Review of Systems  Constitutional: Positive for fever, appetite change and unexpected weight change. Negative for diaphoresis and fatigue.  Eyes: Negative for pain, redness and visual disturbance.  Respiratory: Positive for cough. Negative for chest tightness, shortness of breath and wheezing.   Cardiovascular: Negative for chest pain, palpitations and leg swelling.  Endocrine: Negative for cold intolerance, heat intolerance, polydipsia, polyphagia and polyuria.  Genitourinary: Negative for dysuria, frequency and difficulty urinating.  Neurological: Negative for dizziness, light-headedness, numbness and headaches.    History Past Medical History  Diagnosis Date  . Hypertension   . Thyroid disease   . Dementia   . Abdominal aneurysm ([place])   . Diverticulitis   . Glaucoma   . Urinary incontinence     She has past surgical history that includes Bladder surgery (N/A) and Hip surgery (Right).   Her family history includes Stroke in her mother.She reports that she has never smoked. She has never used smokeless tobacco. She reports that she does not drink alcohol or use illicit drugs.  Current Outpatient Prescriptions on File Prior to Visit  Medication Sig Dispense Refill  . ALPRAZolam (XANAX) 0.25 MG tablet TAKE ONE TABLET 3 TIMES A DAY AS NEEDED. (Patient taking differently: TAKE ONE TABLET 3 TIMES A DAY AS NEEDED FOR ANXIETY) 30 tablet 1  . Calcium Carbonate-Vitamin D 600-200 MG-UNIT CAPS Take 1 tablet by mouth 2 (two) times daily.    .  fexofenadine (ALLEGRA) 180 MG tablet Take 180 mg by mouth daily.    . Lactobacillus Rhamnosus, GG, (CULTURELLE PO) Take 1 capsule by mouth daily.     Marland Kitchen levothyroxine (SYNTHROID, LEVOTHROID) 75 MCG tablet Take 1 tablet (75 mcg total) by mouth daily. 30 tablet 2  . lisinopril (PRINIVIL,ZESTRIL) 10 MG tablet TAKE ONE (1) TABLET BY MOUTH EVERY DAY 90 tablet 1  . memantine (NAMENDA XR) 7 MG CP24 24 hr capsule Take 1 capsule (7 mg total) by mouth daily. 30 capsule 2  . nitroGLYCERIN (NITROSTAT) 0.4 MG SL tablet Place 0.4 mg under the tongue every 5 (five) minutes as needed for chest pain. Reported on 06/01/2015    . ranitidine (ZANTAC) 150 MG tablet Take 1 tablet (150 mg total) by mouth 2 (two) times daily. (Patient taking differently: Take 150 mg by mouth at bedtime. ) 30 tablet 5   No current facility-administered medications on file prior to visit.     Objective:  Objective Physical Exam  Constitutional: She appears well-developed and well-nourished.  HENT:  Head: Normocephalic and atraumatic.  Eyes: Conjunctivae and EOM are normal.  Neck: Normal range of motion. Neck supple. No JVD present. Carotid bruit is not present. No thyromegaly present.  Cardiovascular: Normal rate, regular rhythm and normal heart sounds.   No murmur heard. Pulmonary/Chest: Effort normal and breath sounds normal. No respiratory distress. She has no wheezes. She has no rales. She exhibits no tenderness.  + rhonchi heard with cough  At rest pt is comfortable  Musculoskeletal: She exhibits no edema.  Psychiatric: Her affect is blunt. She is withdrawn.  Pt sitting quietly in wheelchair No distress 2 caretakers are with patient  Nursing note and vitals reviewed.  BP 124/76 mmHg  Pulse 78  Temp(Src) 97.8 F (36.6 C) (Oral)  Wt 113 lb (51.256 kg)  SpO2 95% Wt Readings from Last 3 Encounters:  06/04/15 113 lb (51.256 kg)  05/24/15 123 lb 0.3 oz (55.8 kg)  04/13/15 127 lb 3.2 oz (57.698 kg)     Lab Results    Component Value Date   WBC 7.1 06/04/2015   HGB 14.3 06/04/2015   HCT 43.4 06/04/2015   PLT 251.0 06/04/2015   GLUCOSE 84 06/04/2015   CHOL 145 10/02/2014   TRIG 78.0 10/02/2014   HDL 50.10 10/02/2014   LDLCALC 79 10/02/2014   ALT 11 06/04/2015   AST 16 06/04/2015   NA 144 06/04/2015   K 3.7 06/04/2015   CL 109 06/04/2015   CREATININE 0.91 06/04/2015   BUN 19 06/04/2015   CO2 29 06/04/2015   TSH 0.65 06/04/2015   INR 1.21 05/23/2015    Dg Chest 2 View  06/04/2015  CLINICAL DATA:  Recent diagnosis of flu, persistent cough EXAM: CHEST  2 VIEW COMPARISON:  Chest x-ray of 05/25/2015, and thoracic spine films of 10/02/2014 FINDINGS: Minimally prominent markings are noted at the left lung base not seen previously. This could represent atelectasis or pneumonia and followup is recommended. The right lung is clear. Mediastinal and hilar contours are unremarkable and cardiomegaly is stable. A moderate size hiatal hernia again is noted. The bones are osteopenic and the compression deformity of T11 appears to have compressed further when compared to the thoracic spine films from July of 2016. IMPRESSION: 1. Cannot exclude patchy pneumonia at the left lung base. 2. Moderate hiatal hernia. 3. Increase in compression deformity of T11 when compared to thoracic spine films from July of 2016 Electronically Signed   By: Ivar Drape M.D.   On: 06/04/2015 13:01     Assessment & Plan:  Plan I am having Ms. Mcguinness maintain her Calcium Carbonate-Vitamin D, nitroGLYCERIN, ALPRAZolam, (Lactobacillus Rhamnosus, GG, (CULTURELLE PO)), memantine, ranitidine, levothyroxine, lisinopril, and fexofenadine.  No orders of the defined types were placed in this encounter.    Problem List Items Addressed This Visit    Hypothyroidism - Primary    Recheck labs Will adjust synthroid prn      Relevant Orders   Comp Met (CMET) (Completed)   Thyroid Panel With TSH (Completed)   CBC with Differential/Platelet  (Completed)   Cough    Recently over flu Still cough Check cxr      Relevant Orders   DG Chest 2 View (Completed)    Other Visit Diagnoses    Loose stools        Relevant Orders    Comp Met (CMET) (Completed)    Thyroid Panel With TSH (Completed)    CBC with Differential/Platelet (Completed)       Follow-up: Return if symptoms worsen or fail to improve.  Garnet Koyanagi, DO

## 2015-06-04 NOTE — Patient Instructions (Signed)

## 2015-06-05 ENCOUNTER — Encounter: Payer: Self-pay | Admitting: Family Medicine

## 2015-06-05 DIAGNOSIS — R0602 Shortness of breath: Secondary | ICD-10-CM

## 2015-06-05 DIAGNOSIS — R634 Abnormal weight loss: Secondary | ICD-10-CM

## 2015-06-09 NOTE — Telephone Encounter (Signed)
-----   Message from Rosalita Chessman, DO sent at 06/08/2015 11:28 AM EDT ----- Regarding: RE: kim, see message, ok [facility] referral  i believe she needs hospice  ----- Message -----    From: Ewing Schlein, CMA    Sent: 06/08/2015   9:14 AM      To: Rosalita Chessman, DO Subject: RE: kim, see message, ok [facility] referral           She has agreed to either one. [facility] has Hospice as well as home health.  ----- Message -----    From: Rosalita Chessman, DO    Sent: 06/06/2015  11:27 AM      To: Ewing Schlein, CMA Subject: RE: kim, see message, ok [facility] referral           I really want the patient to come in with the daughter --- I wanted to talk to her about hospice --  The "men" in the family have been the difficult ones.------ basically , Yes, I think it should be hospice but not sure how agreeable they will be to this.      ----- Message -----    From: Ewing Schlein, CMA    Sent: 06/05/2015   4:56 PM      To: Rosalita Chessman, DO Subject: RE: kim, see message, ok [facility] referral           This patient is in [facility] living at [facility], daughter in law is requesting [facility] to come to the facility. She has lost 13 lbs, should she get a [facility] referral or should this be Hospice?   KP  ----- Message -----    From: Colon Branch, MD    Sent: 06/05/2015   2:36 PM      To: Ewing Schlein, CMA Subject: kim, see message, ok [facility] referral

## 2015-06-10 ENCOUNTER — Telehealth: Payer: Self-pay | Admitting: Family Medicine

## 2015-06-10 ENCOUNTER — Encounter: Payer: Self-pay | Admitting: Family Medicine

## 2015-06-10 DIAGNOSIS — F0280 Dementia in other diseases classified elsewhere without behavioral disturbance: Secondary | ICD-10-CM | POA: Diagnosis not present

## 2015-06-10 DIAGNOSIS — J189 Pneumonia, unspecified organism: Secondary | ICD-10-CM | POA: Diagnosis not present

## 2015-06-10 DIAGNOSIS — R0602 Shortness of breath: Secondary | ICD-10-CM | POA: Diagnosis not present

## 2015-06-10 DIAGNOSIS — R339 Retention of urine, unspecified: Secondary | ICD-10-CM | POA: Diagnosis not present

## 2015-06-10 DIAGNOSIS — Z87440 Personal history of urinary (tract) infections: Secondary | ICD-10-CM | POA: Diagnosis not present

## 2015-06-10 DIAGNOSIS — R05 Cough: Secondary | ICD-10-CM | POA: Diagnosis not present

## 2015-06-10 DIAGNOSIS — G309 Alzheimer's disease, unspecified: Secondary | ICD-10-CM | POA: Diagnosis not present

## 2015-06-10 DIAGNOSIS — K219 Gastro-esophageal reflux disease without esophagitis: Secondary | ICD-10-CM | POA: Diagnosis not present

## 2015-06-10 DIAGNOSIS — I10 Essential (primary) hypertension: Secondary | ICD-10-CM | POA: Diagnosis not present

## 2015-06-10 NOTE — Telephone Encounter (Signed)
Caller name:Elizabeth Relationship to patient: Home health Can be reached: [phone]  Reason for call: Saw patient this morning to admit her to [facility]. Request to admit for observation and assessment. Needs order and call back so that she can give a report on her visit

## 2015-06-11 ENCOUNTER — Telehealth: Payer: Self-pay | Admitting: Family Medicine

## 2015-06-11 ENCOUNTER — Ambulatory Visit: Payer: Medicare Other | Admitting: Family Medicine

## 2015-06-11 NOTE — Telephone Encounter (Signed)
Pt is hospice pt-- no charge

## 2015-06-11 NOTE — Telephone Encounter (Signed)
pts daughter Sakinah Gustaveson, left VM 3/16 8:35am stating that she is not going to bring pt out in this cold today bc she is just getting over pneunomia. She said HH has been out and pt is doing ok. No temp, chest sounds good and she feels she is ok for right now. Charge or no charge (appt was 3/16 11:00am)?

## 2015-06-12 ENCOUNTER — Other Ambulatory Visit: Payer: Self-pay | Admitting: Family Medicine

## 2015-06-12 ENCOUNTER — Telehealth: Payer: Self-pay | Admitting: *Deleted

## 2015-06-12 DIAGNOSIS — F0390 Unspecified dementia without behavioral disturbance: Secondary | ICD-10-CM

## 2015-06-12 DIAGNOSIS — E46 Unspecified protein-calorie malnutrition: Secondary | ICD-10-CM

## 2015-06-12 NOTE — Telephone Encounter (Signed)
Caller: Benjamine Mola, RN w/ [facility] #: [phone]  Call to update pt status. She saw pt today and states that pt's lungs are CTA, but diminished bilaterally, no wheezes or crackles. Pt is afebrile, in no distress, but she does have decreased PO intake and "hardly eats anything." She is drinking plenty of fluids. She reports the pt sleeps most of the day and night. Benjamine Mola and pt's daughter in law, Shauna Hugh, would like to pursue Hospice referral and care for the patient as they feel she is steadily declining. [facility] would like to continue to see the patient for observation and assessment. Ok to place referral to hospice?

## 2015-06-12 NOTE — Telephone Encounter (Signed)
Referral for hospice placed--- I thought [facility] did this last week but I don't see anything in computer

## 2015-06-12 NOTE — Telephone Encounter (Signed)
VM left advising we will order Hospice for the patient.    KP

## 2015-06-13 DIAGNOSIS — R339 Retention of urine, unspecified: Secondary | ICD-10-CM | POA: Diagnosis not present

## 2015-06-13 DIAGNOSIS — K219 Gastro-esophageal reflux disease without esophagitis: Secondary | ICD-10-CM | POA: Diagnosis not present

## 2015-06-13 DIAGNOSIS — J189 Pneumonia, unspecified organism: Secondary | ICD-10-CM | POA: Diagnosis not present

## 2015-06-13 DIAGNOSIS — F0280 Dementia in other diseases classified elsewhere without behavioral disturbance: Secondary | ICD-10-CM | POA: Diagnosis not present

## 2015-06-13 DIAGNOSIS — I10 Essential (primary) hypertension: Secondary | ICD-10-CM | POA: Diagnosis not present

## 2015-06-13 DIAGNOSIS — G309 Alzheimer's disease, unspecified: Secondary | ICD-10-CM | POA: Diagnosis not present

## 2015-06-17 DIAGNOSIS — K219 Gastro-esophageal reflux disease without esophagitis: Secondary | ICD-10-CM | POA: Diagnosis not present

## 2015-06-17 DIAGNOSIS — J189 Pneumonia, unspecified organism: Secondary | ICD-10-CM | POA: Diagnosis not present

## 2015-06-17 DIAGNOSIS — F0280 Dementia in other diseases classified elsewhere without behavioral disturbance: Secondary | ICD-10-CM | POA: Diagnosis not present

## 2015-06-17 DIAGNOSIS — R339 Retention of urine, unspecified: Secondary | ICD-10-CM | POA: Diagnosis not present

## 2015-06-17 DIAGNOSIS — G309 Alzheimer's disease, unspecified: Secondary | ICD-10-CM | POA: Diagnosis not present

## 2015-06-17 DIAGNOSIS — I10 Essential (primary) hypertension: Secondary | ICD-10-CM | POA: Diagnosis not present

## 2015-06-18 DIAGNOSIS — R634 Abnormal weight loss: Secondary | ICD-10-CM | POA: Diagnosis not present

## 2015-06-18 DIAGNOSIS — G301 Alzheimer's disease with late onset: Secondary | ICD-10-CM | POA: Diagnosis not present

## 2015-06-18 DIAGNOSIS — I10 Essential (primary) hypertension: Secondary | ICD-10-CM | POA: Diagnosis not present

## 2015-06-18 DIAGNOSIS — R1310 Dysphagia, unspecified: Secondary | ICD-10-CM | POA: Diagnosis not present

## 2015-06-18 DIAGNOSIS — F0280 Dementia in other diseases classified elsewhere without behavioral disturbance: Secondary | ICD-10-CM | POA: Diagnosis not present

## 2015-06-19 ENCOUNTER — Telehealth: Payer: Self-pay | Admitting: *Deleted

## 2015-06-19 NOTE — Telephone Encounter (Signed)
Received [place]; forwarded to provider/SLS 03/24

## 2015-06-19 NOTE — Telephone Encounter (Signed)
Received Physician Verbal Order; forwarded to provider/SLS 03/24

## 2015-06-20 DIAGNOSIS — R1310 Dysphagia, unspecified: Secondary | ICD-10-CM | POA: Diagnosis not present

## 2015-06-20 DIAGNOSIS — G301 Alzheimer's disease with late onset: Secondary | ICD-10-CM | POA: Diagnosis not present

## 2015-06-20 DIAGNOSIS — F0280 Dementia in other diseases classified elsewhere without behavioral disturbance: Secondary | ICD-10-CM | POA: Diagnosis not present

## 2015-06-20 DIAGNOSIS — I10 Essential (primary) hypertension: Secondary | ICD-10-CM | POA: Diagnosis not present

## 2015-06-20 DIAGNOSIS — R634 Abnormal weight loss: Secondary | ICD-10-CM | POA: Diagnosis not present

## 2015-06-22 DIAGNOSIS — I10 Essential (primary) hypertension: Secondary | ICD-10-CM | POA: Diagnosis not present

## 2015-06-22 DIAGNOSIS — R1310 Dysphagia, unspecified: Secondary | ICD-10-CM | POA: Diagnosis not present

## 2015-06-22 DIAGNOSIS — G301 Alzheimer's disease with late onset: Secondary | ICD-10-CM | POA: Diagnosis not present

## 2015-06-22 DIAGNOSIS — F0280 Dementia in other diseases classified elsewhere without behavioral disturbance: Secondary | ICD-10-CM | POA: Diagnosis not present

## 2015-06-22 DIAGNOSIS — R634 Abnormal weight loss: Secondary | ICD-10-CM | POA: Diagnosis not present

## 2015-06-23 DIAGNOSIS — R634 Abnormal weight loss: Secondary | ICD-10-CM | POA: Diagnosis not present

## 2015-06-23 DIAGNOSIS — R1310 Dysphagia, unspecified: Secondary | ICD-10-CM | POA: Diagnosis not present

## 2015-06-23 DIAGNOSIS — G301 Alzheimer's disease with late onset: Secondary | ICD-10-CM | POA: Diagnosis not present

## 2015-06-23 DIAGNOSIS — I10 Essential (primary) hypertension: Secondary | ICD-10-CM | POA: Diagnosis not present

## 2015-06-23 DIAGNOSIS — F0280 Dementia in other diseases classified elsewhere without behavioral disturbance: Secondary | ICD-10-CM | POA: Diagnosis not present

## 2015-06-25 DIAGNOSIS — G301 Alzheimer's disease with late onset: Secondary | ICD-10-CM | POA: Diagnosis not present

## 2015-06-25 DIAGNOSIS — F0280 Dementia in other diseases classified elsewhere without behavioral disturbance: Secondary | ICD-10-CM | POA: Diagnosis not present

## 2015-06-25 DIAGNOSIS — I10 Essential (primary) hypertension: Secondary | ICD-10-CM | POA: Diagnosis not present

## 2015-06-25 DIAGNOSIS — R634 Abnormal weight loss: Secondary | ICD-10-CM | POA: Diagnosis not present

## 2015-06-25 DIAGNOSIS — R1310 Dysphagia, unspecified: Secondary | ICD-10-CM | POA: Diagnosis not present

## 2015-06-26 DIAGNOSIS — R634 Abnormal weight loss: Secondary | ICD-10-CM | POA: Diagnosis not present

## 2015-06-26 DIAGNOSIS — F0280 Dementia in other diseases classified elsewhere without behavioral disturbance: Secondary | ICD-10-CM | POA: Diagnosis not present

## 2015-06-26 DIAGNOSIS — R1310 Dysphagia, unspecified: Secondary | ICD-10-CM | POA: Diagnosis not present

## 2015-06-26 DIAGNOSIS — I10 Essential (primary) hypertension: Secondary | ICD-10-CM | POA: Diagnosis not present

## 2015-06-26 DIAGNOSIS — G301 Alzheimer's disease with late onset: Secondary | ICD-10-CM | POA: Diagnosis not present

## 2015-06-27 DIAGNOSIS — R634 Abnormal weight loss: Secondary | ICD-10-CM | POA: Diagnosis not present

## 2015-06-27 DIAGNOSIS — I10 Essential (primary) hypertension: Secondary | ICD-10-CM | POA: Diagnosis not present

## 2015-06-27 DIAGNOSIS — G301 Alzheimer's disease with late onset: Secondary | ICD-10-CM | POA: Diagnosis not present

## 2015-06-27 DIAGNOSIS — F0280 Dementia in other diseases classified elsewhere without behavioral disturbance: Secondary | ICD-10-CM | POA: Diagnosis not present

## 2015-06-27 DIAGNOSIS — R1310 Dysphagia, unspecified: Secondary | ICD-10-CM | POA: Diagnosis not present

## 2015-06-29 DIAGNOSIS — R1310 Dysphagia, unspecified: Secondary | ICD-10-CM | POA: Diagnosis not present

## 2015-06-29 DIAGNOSIS — R634 Abnormal weight loss: Secondary | ICD-10-CM | POA: Diagnosis not present

## 2015-06-29 DIAGNOSIS — F0280 Dementia in other diseases classified elsewhere without behavioral disturbance: Secondary | ICD-10-CM | POA: Diagnosis not present

## 2015-06-29 DIAGNOSIS — G301 Alzheimer's disease with late onset: Secondary | ICD-10-CM | POA: Diagnosis not present

## 2015-06-29 DIAGNOSIS — I10 Essential (primary) hypertension: Secondary | ICD-10-CM | POA: Diagnosis not present

## 2015-06-30 DIAGNOSIS — I10 Essential (primary) hypertension: Secondary | ICD-10-CM | POA: Diagnosis not present

## 2015-06-30 DIAGNOSIS — R634 Abnormal weight loss: Secondary | ICD-10-CM | POA: Diagnosis not present

## 2015-06-30 DIAGNOSIS — R1310 Dysphagia, unspecified: Secondary | ICD-10-CM | POA: Diagnosis not present

## 2015-06-30 DIAGNOSIS — F0280 Dementia in other diseases classified elsewhere without behavioral disturbance: Secondary | ICD-10-CM | POA: Diagnosis not present

## 2015-06-30 DIAGNOSIS — G301 Alzheimer's disease with late onset: Secondary | ICD-10-CM | POA: Diagnosis not present

## 2015-07-02 DIAGNOSIS — F0280 Dementia in other diseases classified elsewhere without behavioral disturbance: Secondary | ICD-10-CM | POA: Diagnosis not present

## 2015-07-02 DIAGNOSIS — G301 Alzheimer's disease with late onset: Secondary | ICD-10-CM | POA: Diagnosis not present

## 2015-07-02 DIAGNOSIS — I10 Essential (primary) hypertension: Secondary | ICD-10-CM | POA: Diagnosis not present

## 2015-07-02 DIAGNOSIS — R1310 Dysphagia, unspecified: Secondary | ICD-10-CM | POA: Diagnosis not present

## 2015-07-02 DIAGNOSIS — R634 Abnormal weight loss: Secondary | ICD-10-CM | POA: Diagnosis not present

## 2015-07-06 ENCOUNTER — Ambulatory Visit: Payer: PRIVATE HEALTH INSURANCE | Admitting: Family Medicine

## 2015-07-07 DIAGNOSIS — G301 Alzheimer's disease with late onset: Secondary | ICD-10-CM | POA: Diagnosis not present

## 2015-07-07 DIAGNOSIS — I10 Essential (primary) hypertension: Secondary | ICD-10-CM | POA: Diagnosis not present

## 2015-07-07 DIAGNOSIS — R1310 Dysphagia, unspecified: Secondary | ICD-10-CM | POA: Diagnosis not present

## 2015-07-07 DIAGNOSIS — R634 Abnormal weight loss: Secondary | ICD-10-CM | POA: Diagnosis not present

## 2015-07-07 DIAGNOSIS — F0280 Dementia in other diseases classified elsewhere without behavioral disturbance: Secondary | ICD-10-CM | POA: Diagnosis not present

## 2015-07-08 ENCOUNTER — Telehealth: Payer: Self-pay | Admitting: *Deleted

## 2015-07-08 DIAGNOSIS — R1310 Dysphagia, unspecified: Secondary | ICD-10-CM | POA: Diagnosis not present

## 2015-07-08 DIAGNOSIS — G301 Alzheimer's disease with late onset: Secondary | ICD-10-CM | POA: Diagnosis not present

## 2015-07-08 DIAGNOSIS — R634 Abnormal weight loss: Secondary | ICD-10-CM | POA: Diagnosis not present

## 2015-07-08 DIAGNOSIS — F0280 Dementia in other diseases classified elsewhere without behavioral disturbance: Secondary | ICD-10-CM | POA: Diagnosis not present

## 2015-07-08 DIAGNOSIS — I10 Essential (primary) hypertension: Secondary | ICD-10-CM | POA: Diagnosis not present

## 2015-07-08 NOTE — Telephone Encounter (Signed)
Received via mail and forwarded to Dr. Carollee Herter for review/signature. JG//CMA

## 2015-07-13 NOTE — Telephone Encounter (Signed)
Completed forms mailed to Hospice in provided envelope. Copy sent for scanning. JG//CMA

## 2015-07-14 DIAGNOSIS — R1310 Dysphagia, unspecified: Secondary | ICD-10-CM | POA: Diagnosis not present

## 2015-07-14 DIAGNOSIS — R634 Abnormal weight loss: Secondary | ICD-10-CM | POA: Diagnosis not present

## 2015-07-14 DIAGNOSIS — I10 Essential (primary) hypertension: Secondary | ICD-10-CM | POA: Diagnosis not present

## 2015-07-14 DIAGNOSIS — F0280 Dementia in other diseases classified elsewhere without behavioral disturbance: Secondary | ICD-10-CM | POA: Diagnosis not present

## 2015-07-14 DIAGNOSIS — G301 Alzheimer's disease with late onset: Secondary | ICD-10-CM | POA: Diagnosis not present

## 2015-07-16 DIAGNOSIS — R1310 Dysphagia, unspecified: Secondary | ICD-10-CM | POA: Diagnosis not present

## 2015-07-16 DIAGNOSIS — I10 Essential (primary) hypertension: Secondary | ICD-10-CM | POA: Diagnosis not present

## 2015-07-16 DIAGNOSIS — F0280 Dementia in other diseases classified elsewhere without behavioral disturbance: Secondary | ICD-10-CM | POA: Diagnosis not present

## 2015-07-16 DIAGNOSIS — R634 Abnormal weight loss: Secondary | ICD-10-CM | POA: Diagnosis not present

## 2015-07-16 DIAGNOSIS — G301 Alzheimer's disease with late onset: Secondary | ICD-10-CM | POA: Diagnosis not present

## 2015-07-21 DIAGNOSIS — F0280 Dementia in other diseases classified elsewhere without behavioral disturbance: Secondary | ICD-10-CM | POA: Diagnosis not present

## 2015-07-21 DIAGNOSIS — G301 Alzheimer's disease with late onset: Secondary | ICD-10-CM | POA: Diagnosis not present

## 2015-07-21 DIAGNOSIS — I10 Essential (primary) hypertension: Secondary | ICD-10-CM | POA: Diagnosis not present

## 2015-07-21 DIAGNOSIS — R634 Abnormal weight loss: Secondary | ICD-10-CM | POA: Diagnosis not present

## 2015-07-21 DIAGNOSIS — R1310 Dysphagia, unspecified: Secondary | ICD-10-CM | POA: Diagnosis not present

## 2015-07-27 DIAGNOSIS — R634 Abnormal weight loss: Secondary | ICD-10-CM | POA: Diagnosis not present

## 2015-07-27 DIAGNOSIS — I10 Essential (primary) hypertension: Secondary | ICD-10-CM | POA: Diagnosis not present

## 2015-07-27 DIAGNOSIS — R1310 Dysphagia, unspecified: Secondary | ICD-10-CM | POA: Diagnosis not present

## 2015-07-27 DIAGNOSIS — F0280 Dementia in other diseases classified elsewhere without behavioral disturbance: Secondary | ICD-10-CM | POA: Diagnosis not present

## 2015-07-27 DIAGNOSIS — G301 Alzheimer's disease with late onset: Secondary | ICD-10-CM | POA: Diagnosis not present

## 2015-07-28 DIAGNOSIS — G301 Alzheimer's disease with late onset: Secondary | ICD-10-CM | POA: Diagnosis not present

## 2015-07-28 DIAGNOSIS — F0280 Dementia in other diseases classified elsewhere without behavioral disturbance: Secondary | ICD-10-CM | POA: Diagnosis not present

## 2015-07-28 DIAGNOSIS — R634 Abnormal weight loss: Secondary | ICD-10-CM | POA: Diagnosis not present

## 2015-07-28 DIAGNOSIS — R1310 Dysphagia, unspecified: Secondary | ICD-10-CM | POA: Diagnosis not present

## 2015-07-28 DIAGNOSIS — I10 Essential (primary) hypertension: Secondary | ICD-10-CM | POA: Diagnosis not present

## 2015-08-04 DIAGNOSIS — R1310 Dysphagia, unspecified: Secondary | ICD-10-CM | POA: Diagnosis not present

## 2015-08-04 DIAGNOSIS — I10 Essential (primary) hypertension: Secondary | ICD-10-CM | POA: Diagnosis not present

## 2015-08-04 DIAGNOSIS — F0280 Dementia in other diseases classified elsewhere without behavioral disturbance: Secondary | ICD-10-CM | POA: Diagnosis not present

## 2015-08-04 DIAGNOSIS — G301 Alzheimer's disease with late onset: Secondary | ICD-10-CM | POA: Diagnosis not present

## 2015-08-04 DIAGNOSIS — R634 Abnormal weight loss: Secondary | ICD-10-CM | POA: Diagnosis not present

## 2015-08-06 DIAGNOSIS — R1310 Dysphagia, unspecified: Secondary | ICD-10-CM | POA: Diagnosis not present

## 2015-08-06 DIAGNOSIS — I10 Essential (primary) hypertension: Secondary | ICD-10-CM | POA: Diagnosis not present

## 2015-08-06 DIAGNOSIS — F0280 Dementia in other diseases classified elsewhere without behavioral disturbance: Secondary | ICD-10-CM | POA: Diagnosis not present

## 2015-08-06 DIAGNOSIS — G301 Alzheimer's disease with late onset: Secondary | ICD-10-CM | POA: Diagnosis not present

## 2015-08-06 DIAGNOSIS — R634 Abnormal weight loss: Secondary | ICD-10-CM | POA: Diagnosis not present

## 2015-08-10 DIAGNOSIS — I10 Essential (primary) hypertension: Secondary | ICD-10-CM | POA: Diagnosis not present

## 2015-08-10 DIAGNOSIS — G301 Alzheimer's disease with late onset: Secondary | ICD-10-CM | POA: Diagnosis not present

## 2015-08-10 DIAGNOSIS — R634 Abnormal weight loss: Secondary | ICD-10-CM | POA: Diagnosis not present

## 2015-08-10 DIAGNOSIS — R1310 Dysphagia, unspecified: Secondary | ICD-10-CM | POA: Diagnosis not present

## 2015-08-10 DIAGNOSIS — F0280 Dementia in other diseases classified elsewhere without behavioral disturbance: Secondary | ICD-10-CM | POA: Diagnosis not present

## 2015-08-17 DIAGNOSIS — F0280 Dementia in other diseases classified elsewhere without behavioral disturbance: Secondary | ICD-10-CM | POA: Diagnosis not present

## 2015-08-17 DIAGNOSIS — G301 Alzheimer's disease with late onset: Secondary | ICD-10-CM | POA: Diagnosis not present

## 2015-08-17 DIAGNOSIS — R1310 Dysphagia, unspecified: Secondary | ICD-10-CM | POA: Diagnosis not present

## 2015-08-17 DIAGNOSIS — R634 Abnormal weight loss: Secondary | ICD-10-CM | POA: Diagnosis not present

## 2015-08-17 DIAGNOSIS — I10 Essential (primary) hypertension: Secondary | ICD-10-CM | POA: Diagnosis not present

## 2015-08-24 DIAGNOSIS — G301 Alzheimer's disease with late onset: Secondary | ICD-10-CM | POA: Diagnosis not present

## 2015-08-24 DIAGNOSIS — F0280 Dementia in other diseases classified elsewhere without behavioral disturbance: Secondary | ICD-10-CM | POA: Diagnosis not present

## 2015-08-24 DIAGNOSIS — R1310 Dysphagia, unspecified: Secondary | ICD-10-CM | POA: Diagnosis not present

## 2015-08-24 DIAGNOSIS — R634 Abnormal weight loss: Secondary | ICD-10-CM | POA: Diagnosis not present

## 2015-08-24 DIAGNOSIS — I10 Essential (primary) hypertension: Secondary | ICD-10-CM | POA: Diagnosis not present

## 2015-08-27 DIAGNOSIS — I10 Essential (primary) hypertension: Secondary | ICD-10-CM | POA: Diagnosis not present

## 2015-08-27 DIAGNOSIS — R634 Abnormal weight loss: Secondary | ICD-10-CM | POA: Diagnosis not present

## 2015-08-27 DIAGNOSIS — R1310 Dysphagia, unspecified: Secondary | ICD-10-CM | POA: Diagnosis not present

## 2015-08-27 DIAGNOSIS — F0280 Dementia in other diseases classified elsewhere without behavioral disturbance: Secondary | ICD-10-CM | POA: Diagnosis not present

## 2015-08-27 DIAGNOSIS — G301 Alzheimer's disease with late onset: Secondary | ICD-10-CM | POA: Diagnosis not present

## 2015-09-01 DIAGNOSIS — I10 Essential (primary) hypertension: Secondary | ICD-10-CM | POA: Diagnosis not present

## 2015-09-01 DIAGNOSIS — R1310 Dysphagia, unspecified: Secondary | ICD-10-CM | POA: Diagnosis not present

## 2015-09-01 DIAGNOSIS — F0280 Dementia in other diseases classified elsewhere without behavioral disturbance: Secondary | ICD-10-CM | POA: Diagnosis not present

## 2015-09-01 DIAGNOSIS — G301 Alzheimer's disease with late onset: Secondary | ICD-10-CM | POA: Diagnosis not present

## 2015-09-01 DIAGNOSIS — R634 Abnormal weight loss: Secondary | ICD-10-CM | POA: Diagnosis not present

## 2015-09-04 DIAGNOSIS — R634 Abnormal weight loss: Secondary | ICD-10-CM | POA: Diagnosis not present

## 2015-09-04 DIAGNOSIS — R1310 Dysphagia, unspecified: Secondary | ICD-10-CM | POA: Diagnosis not present

## 2015-09-04 DIAGNOSIS — G301 Alzheimer's disease with late onset: Secondary | ICD-10-CM | POA: Diagnosis not present

## 2015-09-04 DIAGNOSIS — F0280 Dementia in other diseases classified elsewhere without behavioral disturbance: Secondary | ICD-10-CM | POA: Diagnosis not present

## 2015-09-04 DIAGNOSIS — I10 Essential (primary) hypertension: Secondary | ICD-10-CM | POA: Diagnosis not present

## 2015-09-08 DIAGNOSIS — G301 Alzheimer's disease with late onset: Secondary | ICD-10-CM | POA: Diagnosis not present

## 2015-09-08 DIAGNOSIS — I10 Essential (primary) hypertension: Secondary | ICD-10-CM | POA: Diagnosis not present

## 2015-09-08 DIAGNOSIS — F0280 Dementia in other diseases classified elsewhere without behavioral disturbance: Secondary | ICD-10-CM | POA: Diagnosis not present

## 2015-09-08 DIAGNOSIS — R634 Abnormal weight loss: Secondary | ICD-10-CM | POA: Diagnosis not present

## 2015-09-08 DIAGNOSIS — R1310 Dysphagia, unspecified: Secondary | ICD-10-CM | POA: Diagnosis not present

## 2015-09-09 DIAGNOSIS — I10 Essential (primary) hypertension: Secondary | ICD-10-CM | POA: Diagnosis not present

## 2015-09-09 DIAGNOSIS — F0280 Dementia in other diseases classified elsewhere without behavioral disturbance: Secondary | ICD-10-CM | POA: Diagnosis not present

## 2015-09-09 DIAGNOSIS — R1310 Dysphagia, unspecified: Secondary | ICD-10-CM | POA: Diagnosis not present

## 2015-09-09 DIAGNOSIS — G301 Alzheimer's disease with late onset: Secondary | ICD-10-CM | POA: Diagnosis not present

## 2015-09-09 DIAGNOSIS — R634 Abnormal weight loss: Secondary | ICD-10-CM | POA: Diagnosis not present

## 2015-09-15 DIAGNOSIS — I10 Essential (primary) hypertension: Secondary | ICD-10-CM | POA: Diagnosis not present

## 2015-09-15 DIAGNOSIS — R1310 Dysphagia, unspecified: Secondary | ICD-10-CM | POA: Diagnosis not present

## 2015-09-15 DIAGNOSIS — R634 Abnormal weight loss: Secondary | ICD-10-CM | POA: Diagnosis not present

## 2015-09-15 DIAGNOSIS — G301 Alzheimer's disease with late onset: Secondary | ICD-10-CM | POA: Diagnosis not present

## 2015-09-15 DIAGNOSIS — F0280 Dementia in other diseases classified elsewhere without behavioral disturbance: Secondary | ICD-10-CM | POA: Diagnosis not present

## 2015-09-16 ENCOUNTER — Telehealth: Payer: Self-pay | Admitting: Family Medicine

## 2015-09-16 DIAGNOSIS — I10 Essential (primary) hypertension: Secondary | ICD-10-CM | POA: Diagnosis not present

## 2015-09-16 DIAGNOSIS — R1310 Dysphagia, unspecified: Secondary | ICD-10-CM | POA: Diagnosis not present

## 2015-09-16 DIAGNOSIS — F0280 Dementia in other diseases classified elsewhere without behavioral disturbance: Secondary | ICD-10-CM | POA: Diagnosis not present

## 2015-09-16 DIAGNOSIS — R634 Abnormal weight loss: Secondary | ICD-10-CM | POA: Diagnosis not present

## 2015-09-16 DIAGNOSIS — G301 Alzheimer's disease with late onset: Secondary | ICD-10-CM | POA: Diagnosis not present

## 2015-09-16 NOTE — Telephone Encounter (Signed)
Please advise.//AB/CMA 

## 2015-09-16 NOTE — Telephone Encounter (Signed)
Caller name: Orlene Plum  Can be reached: [phone]    Reason for call: [facility] is requesting an order to continue services on patient. States they feel she still needs their services

## 2015-09-17 NOTE — Telephone Encounter (Signed)
Ok to cont

## 2015-09-17 NOTE — Telephone Encounter (Signed)
Detailed message left to continue services on VM.    KP

## 2015-09-22 DIAGNOSIS — R634 Abnormal weight loss: Secondary | ICD-10-CM | POA: Diagnosis not present

## 2015-09-22 DIAGNOSIS — F0280 Dementia in other diseases classified elsewhere without behavioral disturbance: Secondary | ICD-10-CM | POA: Diagnosis not present

## 2015-09-22 DIAGNOSIS — G301 Alzheimer's disease with late onset: Secondary | ICD-10-CM | POA: Diagnosis not present

## 2015-09-22 DIAGNOSIS — R1310 Dysphagia, unspecified: Secondary | ICD-10-CM | POA: Diagnosis not present

## 2015-09-22 DIAGNOSIS — I10 Essential (primary) hypertension: Secondary | ICD-10-CM | POA: Diagnosis not present

## 2015-09-26 DIAGNOSIS — F0280 Dementia in other diseases classified elsewhere without behavioral disturbance: Secondary | ICD-10-CM | POA: Diagnosis not present

## 2015-09-26 DIAGNOSIS — R1310 Dysphagia, unspecified: Secondary | ICD-10-CM | POA: Diagnosis not present

## 2015-09-26 DIAGNOSIS — G301 Alzheimer's disease with late onset: Secondary | ICD-10-CM | POA: Diagnosis not present

## 2015-09-26 DIAGNOSIS — I10 Essential (primary) hypertension: Secondary | ICD-10-CM | POA: Diagnosis not present

## 2015-09-26 DIAGNOSIS — R634 Abnormal weight loss: Secondary | ICD-10-CM | POA: Diagnosis not present

## 2015-10-18 ENCOUNTER — Emergency Department (HOSPITAL_COMMUNITY)

## 2015-10-18 ENCOUNTER — Emergency Department (HOSPITAL_COMMUNITY)
Admission: EM | Admit: 2015-10-18 | Discharge: 2015-10-19 | Disposition: A | Discharge status: Home / Self Care | Attending: Emergency Medicine | Admitting: Emergency Medicine

## 2015-10-18 ENCOUNTER — Encounter (HOSPITAL_COMMUNITY): Payer: Self-pay

## 2015-10-18 DIAGNOSIS — S0990XA Unspecified injury of head, initial encounter: Secondary | ICD-10-CM | POA: Diagnosis present

## 2015-10-18 DIAGNOSIS — S0083XA Contusion of other part of head, initial encounter: Secondary | ICD-10-CM | POA: Diagnosis not present

## 2015-10-18 DIAGNOSIS — I10 Essential (primary) hypertension: Secondary | ICD-10-CM | POA: Diagnosis not present

## 2015-10-18 DIAGNOSIS — F0390 Unspecified dementia without behavioral disturbance: Secondary | ICD-10-CM | POA: Diagnosis not present

## 2015-10-18 DIAGNOSIS — S0511XA Contusion of eyeball and orbital tissues, right eye, initial encounter: Secondary | ICD-10-CM | POA: Diagnosis not present

## 2015-10-18 DIAGNOSIS — Z79899 Other long term (current) drug therapy: Secondary | ICD-10-CM | POA: Insufficient documentation

## 2015-10-18 DIAGNOSIS — S065X0A Traumatic subdural hemorrhage without loss of consciousness, initial encounter: Secondary | ICD-10-CM | POA: Insufficient documentation

## 2015-10-18 DIAGNOSIS — Y939 Activity, unspecified: Secondary | ICD-10-CM | POA: Insufficient documentation

## 2015-10-18 DIAGNOSIS — Y92009 Unspecified place in unspecified non-institutional (private) residence as the place of occurrence of the external cause: Secondary | ICD-10-CM | POA: Insufficient documentation

## 2015-10-18 DIAGNOSIS — S065X9A Traumatic subdural hemorrhage with loss of consciousness of unspecified duration, initial encounter: Secondary | ICD-10-CM

## 2015-10-18 DIAGNOSIS — Y999 Unspecified external cause status: Secondary | ICD-10-CM | POA: Insufficient documentation

## 2015-10-18 DIAGNOSIS — W228XXA Striking against or struck by other objects, initial encounter: Secondary | ICD-10-CM | POA: Insufficient documentation

## 2015-10-18 DIAGNOSIS — S065XAA Traumatic subdural hemorrhage with loss of consciousness status unknown, initial encounter: Secondary | ICD-10-CM

## 2015-10-18 DIAGNOSIS — S50311A Abrasion of right elbow, initial encounter: Secondary | ICD-10-CM | POA: Diagnosis not present

## 2015-10-18 DIAGNOSIS — T148 Other injury of unspecified body region: Secondary | ICD-10-CM | POA: Diagnosis not present

## 2015-10-18 NOTE — ED Triage Notes (Signed)
To hallway bed via EMS.  Pt from [facility].  Pts caregiver was by side, pt fell getting out of OOB, hit right side of face and right elbow on nightstand.  Swelling, bruising and abrasion to right cheek/eye area.  Abrasion to right elbow.  Pt has dementia, does not know self or family, per family she is at baseline.  NO LOC.

## 2015-10-18 NOTE — ED Provider Notes (Signed)
[facility] DEPT Provider Note   CSN: [id number] Arrival date & time: 10/18/15  2259  First Provider Contact:  None     By signing my name below, I, Altamease Oiler, attest that this documentation has been prepared under the direction and in the presence of Merryl Hacker, MD. Electronically Signed: Altamease Oiler, ED Scribe. 10/19/15. 3:55 AM    History   Chief Complaint Chief Complaint  Patient presents with  . Fall  . Eye Injury  . Abrasion   LEVEL V CAVEAT DUE TO DEMENTIA  HPI  The history is provided by the patient and a relative. No language interpreter was used.   Virginia Davidson is a 82 y.o. female with history of dementia who presents to the Emergency Department for evaluation after a fall tonight at her nursing facility. Reportedly the pt was attempting to get out of bed and lost balance before striking her head/face on a dresser. Pt is unable to recall the fall.  Associated symptoms include swelling, bruising, and abrasion under the right eye and an abrasion with mild pain at the right elbow. Pt denies any other pain.   Past Medical History:  Diagnosis Date  . Abdominal aneurysm ([place])   . Dementia   . Diverticulitis   . Glaucoma   . Hypertension   . Thyroid disease   . Urinary incontinence     Patient Active Problem List   Diagnosis Date Noted  . Cough 06/04/2015  . Urinary retention 05/25/2015  . Influenza A (H1N1) 05/24/2015  . Sepsis ([place]) 05/23/2015  . Acute respiratory failure ([place]) 05/23/2015  . HCAP (healthcare-associated pneumonia) 05/23/2015  . Intertrochanteric fracture of right hip S/P ORIF and IM screw 11/29/2013  . Essential hypertension, benign 11/29/2013  . Hypothyroidism 11/29/2013  . GERD (gastroesophageal reflux disease) 11/29/2013  . Dementia 11/29/2013  . Anxiety 11/29/2013  . Acute blood loss anemia 11/29/2013  . UTI (urinary tract infection) 11/29/2013  . Unspecified constipation 11/29/2013    Past Surgical History:    Procedure Laterality Date  . BLADDER SURGERY N/A    x2  . HIP SURGERY Right     OB History    No data available       Home Medications    Prior to Admission medications   Medication Sig Start Date End Date Taking? Authorizing Provider  ALPRAZolam (XANAX) 0.25 MG tablet TAKE ONE TABLET 3 TIMES A DAY AS NEEDED. Patient taking differently: TAKE ONE TABLET 3 TIMES A DAY AS NEEDED FOR ANXIETY 12/22/14  Yes Yvonne R Lowne Chase, DO  Calcium Carbonate-Vitamin D 600-200 MG-UNIT CAPS Take 1 tablet by mouth 2 (two) times daily.   Yes Historical Provider, MD  levothyroxine (SYNTHROID, LEVOTHROID) 75 MCG tablet Take 1 tablet (75 mcg total) by mouth daily. 04/22/15  Yes Yvonne R Lowne Chase, DO  lisinopril (PRINIVIL,ZESTRIL) 10 MG tablet TAKE ONE (1) TABLET BY MOUTH EVERY DAY 05/14/15  Yes Yvonne R Lowne Chase, DO  memantine (NAMENDA XR) 7 MG CP24 24 hr capsule Take 1 capsule (7 mg total) by mouth daily. 04/13/15  Yes Yvonne R Lowne Chase, DO  nitroGLYCERIN (NITROSTAT) 0.4 MG SL tablet Place 0.4 mg under the tongue every 5 (five) minutes as needed for chest pain. Reported on 06/01/2015   Yes Historical Provider, MD  ranitidine (ZANTAC) 150 MG tablet Take 1 tablet (150 mg total) by mouth 2 (two) times daily. Patient taking differently: Take 150 mg by mouth at bedtime.  04/13/15  Yes Ann Held, DO  senna-docusate (SENOKOT-S) 8.6-50 MG tablet Take 1 tablet by mouth daily as needed for mild constipation.   Yes Historical Provider, MD    Family History Family History  Problem Relation Age of Onset  . Stroke Mother     Social History Social History  Substance Use Topics  . Smoking status: Never Smoker  . Smokeless tobacco: Never Used  . Alcohol use No     Allergies   Penicillins; Sulfamethoxazole; Alendronate sodium; and Tetracyclines & related   Review of Systems Review of Systems  Unable to perform ROS: Dementia     Physical Exam Updated Vital Signs BP 133/67   Pulse 69    Temp 98.1 F (36.7 C) (Oral)   Resp 19   Ht 5\' 4"  (1.626 m)   Wt 90 lb (40.8 kg)   SpO2 95%   BMI 15.45 kg/m   Physical Exam  Constitutional:  Elderly, frail  HENT:  Head: Normocephalic.  Hematoma right temple, no deformities noted, right eye ecchymosis with hematoma and swelling  Eyes: Pupils are equal, round, and reactive to light.  Neck: Normal range of motion. Neck supple.  Cardiovascular: Normal rate, regular rhythm and normal heart sounds.   Pulmonary/Chest: Effort normal and breath sounds normal. No respiratory distress. She has no wheezes.  Abdominal: Soft. Bowel sounds are normal. She exhibits no distension. There is no tenderness.  Musculoskeletal: Normal range of motion. She exhibits no deformity.  2+ DP pulse  Neurological: She is alert.  Skin: Skin is warm and dry.  Skin tears right elbow  Nursing note and vitals reviewed.    ED Treatments / Results  Labs (all labs ordered are listed, but only abnormal results are displayed) Labs Reviewed  URINALYSIS, ROUTINE W REFLEX MICROSCOPIC (NOT AT [facility]) - Abnormal; Notable for the following:       Result Value   APPearance CLOUDY (*)    Hgb urine dipstick MODERATE (*)    Leukocytes, UA MODERATE (*)    All other components within normal limits  CBC WITH DIFFERENTIAL/PLATELET - Abnormal; Notable for the following:    Monocytes Absolute 1.1 (*)    All other components within normal limits  BASIC METABOLIC PANEL - Abnormal; Notable for the following:    Glucose, Bld 115 (*)    GFR calc non Af Amer 57 (*)    All other components within normal limits  URINE MICROSCOPIC-ADD ON - Abnormal; Notable for the following:    Squamous Epithelial / LPF 0-5 (*)    Bacteria, UA FEW (*)    All other components within normal limits  URINE CULTURE    EKG  EKG Interpretation  Date/Time:  Monday October 19 2015 00:34:40 EDT Ventricular Rate:  75 PR Interval:    QRS Duration: 89 QT Interval:  419 QTC Calculation: 468 R  Axis:     Text Interpretation:  Sinus or ectopic atrial rhythm Prolonged PR interval No prior for comparison Confirmed by HORTON  MD, COURTNEY ([id number]) on 10/19/2015 12:51:02 AM       Radiology Dg Elbow Complete Right  Result Date: 10/19/2015 CLINICAL DATA:  Post fall, now with laceration involving the posterior aspect of the right elbow. EXAM: RIGHT ELBOW - COMPLETE 3+ VIEW COMPARISON:  None. FINDINGS: No mobile joint effusion or displaced elbow fracture. Regional soft tissues appear normal. No radiopaque foreign body. Joint spaces are preserved. IMPRESSION: No fracture, elbow joint effusion or radiopaque foreign body. Electronically Signed   By: Sandi Mariscal M.D.   On: 10/19/2015  00:41  Ct Head Wo Contrast  Result Date: 10/19/2015 CLINICAL DATA:  82 year old female with fall EXAM: CT HEAD WITHOUT CONTRAST CT MAXILLOFACIAL WITHOUT CONTRAST CT CERVICAL SPINE WITHOUT CONTRAST TECHNIQUE: Multidetector CT imaging of the head, cervical spine, and maxillofacial structures were performed using the standard protocol without intravenous contrast. Multiplanar CT image reconstructions of the cervical spine and maxillofacial structures were also generated. COMPARISON:  None. FINDINGS: CT HEAD FINDINGS Evaluation of this exam is limited due to motion artifact. There is a right hemispheric subdural hemorrhage measuring up to 4 mm over the right temporal lobe. There is a small right subarachnoid hemorrhage within the sylvian fissure. No intraventricular hemorrhage. There is dilatation of the ventricles and sulci compatible with age-related atrophy. Periventricular and deep white matter chronic microvascular ischemic changes noted. There is no midline shift. The visualized paranasal sinuses and mastoid air cells are clear. No calvarial fracture. CT MAXILLOFACIAL FINDINGS No acute fracture of the facial bone. The maxilla, mandible, and pterygoid plates are intact. The globes, retro-orbital fat, and orbital walls are  preserved. There is right facial and periorbital hematoma. CT CERVICAL SPINE FINDINGS There is no acute fracture or subluxation of the cervical spine.There is osteopenia with multilevel degenerative changes and facet hypertrophy.The odontoid and spinous processes are intact.There is normal anatomic alignment of the C1-C2 lateral masses. The visualized soft tissues appear unremarkable. IMPRESSION: A right hemispheric subdural hemorrhage as well as small amount of subarachnoid hemorrhage in the right sylvian fissure. Close follow-up recommended. No acute/traumatic cervical spine or facial bone pathology. These results were called by telephone at the time of interpretation on 10/19/2015 at 12:37 am to Dr. Thayer Jew , who verbally acknowledged these results. Electronically Signed   By: Anner Crete M.D.   On: 10/19/2015 00:37  Ct Cervical Spine Wo Contrast  Result Date: 10/19/2015 CLINICAL DATA:  82 year old female with fall EXAM: CT HEAD WITHOUT CONTRAST CT MAXILLOFACIAL WITHOUT CONTRAST CT CERVICAL SPINE WITHOUT CONTRAST TECHNIQUE: Multidetector CT imaging of the head, cervical spine, and maxillofacial structures were performed using the standard protocol without intravenous contrast. Multiplanar CT image reconstructions of the cervical spine and maxillofacial structures were also generated. COMPARISON:  None. FINDINGS: CT HEAD FINDINGS Evaluation of this exam is limited due to motion artifact. There is a right hemispheric subdural hemorrhage measuring up to 4 mm over the right temporal lobe. There is a small right subarachnoid hemorrhage within the sylvian fissure. No intraventricular hemorrhage. There is dilatation of the ventricles and sulci compatible with age-related atrophy. Periventricular and deep white matter chronic microvascular ischemic changes noted. There is no midline shift. The visualized paranasal sinuses and mastoid air cells are clear. No calvarial fracture. CT MAXILLOFACIAL FINDINGS No  acute fracture of the facial bone. The maxilla, mandible, and pterygoid plates are intact. The globes, retro-orbital fat, and orbital walls are preserved. There is right facial and periorbital hematoma. CT CERVICAL SPINE FINDINGS There is no acute fracture or subluxation of the cervical spine.There is osteopenia with multilevel degenerative changes and facet hypertrophy.The odontoid and spinous processes are intact.There is normal anatomic alignment of the C1-C2 lateral masses. The visualized soft tissues appear unremarkable. IMPRESSION: A right hemispheric subdural hemorrhage as well as small amount of subarachnoid hemorrhage in the right sylvian fissure. Close follow-up recommended. No acute/traumatic cervical spine or facial bone pathology. These results were called by telephone at the time of interpretation on 10/19/2015 at 12:37 am to Dr. Thayer Jew , who verbally acknowledged these results. Electronically Signed   By: Milas Hock  Radparvar M.D.   On: 10/19/2015 00:37  Dg Hips Bilat With Pelvis 2v  Result Date: 10/19/2015 CLINICAL DATA:  Post fall, now with pain involving the bilateral hips. EXAM: DG HIP (WITH OR WITHOUT PELVIS) 2V BILAT COMPARISON:  CT abdomen pelvis - 09/26/2014 FINDINGS: Post intra medullary rod fixation of the right femur with dynamic screw fixation of the right femoral neck. No evidence of hardware failure loosening. Sequela of prior avulsion injury involving the right lesser trochanter, unchanged. Osteopenia.  No definite displaced hip or pelvic fracture. Bilateral hip joint spaces appear preserved. No evidence avascular necrosis. Regional soft tissues appear normal.  No radiopaque foreign body. IMPRESSION: 1. Osteopenia without definitive displaced hip or pelvic fracture. 2. Post intra medullary rod fixation of the right femur and dynamic screw fixation of the right femoral neck without evidence of hardware failure loosening. Electronically Signed   By: Sandi Mariscal M.D.   On:  10/19/2015 00:34  Ct Maxillofacial Wo Cm  Result Date: 10/19/2015 CLINICAL DATA:  82 year old female with fall EXAM: CT HEAD WITHOUT CONTRAST CT MAXILLOFACIAL WITHOUT CONTRAST CT CERVICAL SPINE WITHOUT CONTRAST TECHNIQUE: Multidetector CT imaging of the head, cervical spine, and maxillofacial structures were performed using the standard protocol without intravenous contrast. Multiplanar CT image reconstructions of the cervical spine and maxillofacial structures were also generated. COMPARISON:  None. FINDINGS: CT HEAD FINDINGS Evaluation of this exam is limited due to motion artifact. There is a right hemispheric subdural hemorrhage measuring up to 4 mm over the right temporal lobe. There is a small right subarachnoid hemorrhage within the sylvian fissure. No intraventricular hemorrhage. There is dilatation of the ventricles and sulci compatible with age-related atrophy. Periventricular and deep white matter chronic microvascular ischemic changes noted. There is no midline shift. The visualized paranasal sinuses and mastoid air cells are clear. No calvarial fracture. CT MAXILLOFACIAL FINDINGS No acute fracture of the facial bone. The maxilla, mandible, and pterygoid plates are intact. The globes, retro-orbital fat, and orbital walls are preserved. There is right facial and periorbital hematoma. CT CERVICAL SPINE FINDINGS There is no acute fracture or subluxation of the cervical spine.There is osteopenia with multilevel degenerative changes and facet hypertrophy.The odontoid and spinous processes are intact.There is normal anatomic alignment of the C1-C2 lateral masses. The visualized soft tissues appear unremarkable. IMPRESSION: A right hemispheric subdural hemorrhage as well as small amount of subarachnoid hemorrhage in the right sylvian fissure. Close follow-up recommended. No acute/traumatic cervical spine or facial bone pathology. These results were called by telephone at the time of interpretation on  10/19/2015 at 12:37 am to Dr. Thayer Jew , who verbally acknowledged these results. Electronically Signed   By: Anner Crete M.D.   On: 10/19/2015 00:37   Procedures Procedures (including critical care time)  Medications Ordered in ED Medications - No data to display   Initial Impression / Assessment and Plan / ED Course  I have reviewed the triage vital signs and the nursing notes.  COORDINATION OF CARE: 11:28 PM Discussed treatment plan which includes CT head, right elbow XR, UA, and EKG with the patient's son and daughter-in-law at bedside and they agreed to plan.   Pertinent labs & imaging results that were available during my care of the patient were reviewed by me and considered in my medical decision making (see chart for details).  Clinical Course    Patient presents following a fall. It was a witnessed fall. She has a history of dementia. Evidence of skin tears right elbow and trauma to  the face. She is DO NOT RESUSCITATE.  Discussed workup with the family to include EKG, x-ray, CT head and neck.  CT scan concerning for small subdural hematoma and subarachnoid hemorrhage. Discussed this with the family. Patient is at her baseline. Discussed with Dr. Sherwood Gambler on call for neurosurgery. Nothing to do from a neurosurgical perspective. Avoid any anticoagulation. Does not recommend repeat CT or admission from a neurosurgical perspective. Family states that the patient has good hospice care but no one to take care of her tonight. We'll hold until the morning and observe.  Final Clinical Impressions(s) / ED Diagnoses   Final diagnoses:  Facial contusion, initial encounter  SDH (subdural hematoma) (HCC)    New Prescriptions New Prescriptions   No medications on file    I personally performed the services described in this documentation, which was scribed in my presence. The recorded information has been reviewed and is accurate.    Merryl Hacker, MD 10/19/15  [phone]

## 2015-10-19 ENCOUNTER — Emergency Department (HOSPITAL_COMMUNITY)

## 2015-10-19 ENCOUNTER — Telehealth: Payer: Self-pay

## 2015-10-19 DIAGNOSIS — S065X0A Traumatic subdural hemorrhage without loss of consciousness, initial encounter: Secondary | ICD-10-CM | POA: Diagnosis not present

## 2015-10-19 LAB — BASIC METABOLIC PANEL
Anion gap: 6 (ref 5–15)
BUN: 20 mg/dL (ref 6–20)
CHLORIDE: 106 mmol/L (ref 101–111)
CO2: 26 mmol/L (ref 22–32)
Calcium: 9.7 mg/dL (ref 8.9–10.3)
Creatinine, Ser: 0.83 mg/dL (ref 0.44–1.00)
GFR calc Af Amer: >60 mL/min (ref >60)
GFR calc non Af Amer: 57 mL/min — ABNORMAL LOW (ref >60)
GLUCOSE: 115 mg/dL — AB (ref 65–99)
POTASSIUM: 4 mmol/L (ref 3.5–5.1)
Sodium: 138 mmol/L (ref 135–145)

## 2015-10-19 LAB — CBC WITH DIFFERENTIAL/PLATELET
Basophils Absolute: 0 10*3/uL (ref 0.0–0.1)
Basophils Relative: 0 %
Eosinophils Absolute: 0.2 10*3/uL (ref 0.0–0.7)
Eosinophils Relative: 2 %
HCT: 43 % (ref 36.0–46.0)
HEMOGLOBIN: 13.8 g/dL (ref 12.0–15.0)
LYMPHS ABS: 0.8 10*3/uL (ref 0.7–4.0)
LYMPHS PCT: 9 %
MCH: 30.9 pg (ref 26.0–34.0)
MCHC: 32.1 g/dL (ref 30.0–36.0)
MCV: 96.2 fL (ref 78.0–100.0)
MONOS PCT: 12 %
Monocytes Absolute: 1.1 10*3/uL — ABNORMAL HIGH (ref 0.1–1.0)
NEUTROS PCT: 77 %
Neutro Abs: 6.7 10*3/uL (ref 1.7–7.7)
Platelets: 206 10*3/uL (ref 150–400)
RBC: 4.47 MIL/uL (ref 3.87–5.11)
RDW: 14.8 % (ref 11.5–15.5)
WBC: 8.8 10*3/uL (ref 4.0–10.5)

## 2015-10-19 LAB — URINALYSIS, ROUTINE W REFLEX MICROSCOPIC
Bilirubin Urine: NEGATIVE
Glucose, UA: NEGATIVE mg/dL
Ketones, ur: NEGATIVE mg/dL
NITRITE: NEGATIVE
PH: 7 (ref 5.0–8.0)
Protein, ur: NEGATIVE mg/dL
SPECIFIC GRAVITY, URINE: 1.014 (ref 1.005–1.030)

## 2015-10-19 LAB — URINE MICROSCOPIC-ADD ON

## 2015-10-19 NOTE — ED Notes (Signed)
Notified Lilia Pro, Chief Technology Officer need.  PT is a high fall risk with dementia that fell earlier tonight at home.  ED staff sitting with pt.

## 2015-10-19 NOTE — ED Notes (Signed)
Ice water given to family member.  Pt and family denies any needs at this time.

## 2015-10-19 NOTE — ED Notes (Signed)
Pt to radiology.

## 2015-10-19 NOTE — Telephone Encounter (Signed)
Received via mail and forwarded to Dr. Carollee Herter for review/signature. Completed forms mailed to Hospice in envelope provided. Copy sent for scanning.

## 2015-10-20 LAB — URINE CULTURE: CULTURE: NO GROWTH

## 2015-10-27 DIAGNOSIS — F0280 Dementia in other diseases classified elsewhere without behavioral disturbance: Secondary | ICD-10-CM | POA: Diagnosis not present

## 2015-10-27 DIAGNOSIS — G301 Alzheimer's disease with late onset: Secondary | ICD-10-CM | POA: Diagnosis not present

## 2015-10-27 DIAGNOSIS — I10 Essential (primary) hypertension: Secondary | ICD-10-CM | POA: Diagnosis not present

## 2015-10-27 DIAGNOSIS — R634 Abnormal weight loss: Secondary | ICD-10-CM | POA: Diagnosis not present

## 2015-10-27 DIAGNOSIS — R1310 Dysphagia, unspecified: Secondary | ICD-10-CM | POA: Diagnosis not present

## 2015-10-28 DIAGNOSIS — G301 Alzheimer's disease with late onset: Secondary | ICD-10-CM | POA: Diagnosis not present

## 2015-10-28 DIAGNOSIS — R634 Abnormal weight loss: Secondary | ICD-10-CM | POA: Diagnosis not present

## 2015-10-28 DIAGNOSIS — F0280 Dementia in other diseases classified elsewhere without behavioral disturbance: Secondary | ICD-10-CM | POA: Diagnosis not present

## 2015-10-28 DIAGNOSIS — I10 Essential (primary) hypertension: Secondary | ICD-10-CM | POA: Diagnosis not present

## 2015-10-28 DIAGNOSIS — R1310 Dysphagia, unspecified: Secondary | ICD-10-CM | POA: Diagnosis not present

## 2015-10-30 DIAGNOSIS — G301 Alzheimer's disease with late onset: Secondary | ICD-10-CM | POA: Diagnosis not present

## 2015-10-30 DIAGNOSIS — R1310 Dysphagia, unspecified: Secondary | ICD-10-CM | POA: Diagnosis not present

## 2015-10-30 DIAGNOSIS — I10 Essential (primary) hypertension: Secondary | ICD-10-CM | POA: Diagnosis not present

## 2015-10-30 DIAGNOSIS — F0280 Dementia in other diseases classified elsewhere without behavioral disturbance: Secondary | ICD-10-CM | POA: Diagnosis not present

## 2015-10-30 DIAGNOSIS — R634 Abnormal weight loss: Secondary | ICD-10-CM | POA: Diagnosis not present

## 2015-11-11 DIAGNOSIS — R1310 Dysphagia, unspecified: Secondary | ICD-10-CM | POA: Diagnosis not present

## 2015-11-11 DIAGNOSIS — I10 Essential (primary) hypertension: Secondary | ICD-10-CM | POA: Diagnosis not present

## 2015-11-11 DIAGNOSIS — G301 Alzheimer's disease with late onset: Secondary | ICD-10-CM | POA: Diagnosis not present

## 2015-11-11 DIAGNOSIS — F0280 Dementia in other diseases classified elsewhere without behavioral disturbance: Secondary | ICD-10-CM | POA: Diagnosis not present

## 2015-11-11 DIAGNOSIS — R634 Abnormal weight loss: Secondary | ICD-10-CM | POA: Diagnosis not present

## 2015-11-25 DIAGNOSIS — F0280 Dementia in other diseases classified elsewhere without behavioral disturbance: Secondary | ICD-10-CM | POA: Diagnosis not present

## 2015-11-25 DIAGNOSIS — I10 Essential (primary) hypertension: Secondary | ICD-10-CM | POA: Diagnosis not present

## 2015-11-25 DIAGNOSIS — R1310 Dysphagia, unspecified: Secondary | ICD-10-CM | POA: Diagnosis not present

## 2015-11-25 DIAGNOSIS — R634 Abnormal weight loss: Secondary | ICD-10-CM | POA: Diagnosis not present

## 2015-11-25 DIAGNOSIS — G301 Alzheimer's disease with late onset: Secondary | ICD-10-CM | POA: Diagnosis not present

## 2015-11-26 DIAGNOSIS — G301 Alzheimer's disease with late onset: Secondary | ICD-10-CM | POA: Diagnosis not present

## 2015-11-26 DIAGNOSIS — R1310 Dysphagia, unspecified: Secondary | ICD-10-CM | POA: Diagnosis not present

## 2015-11-26 DIAGNOSIS — F0280 Dementia in other diseases classified elsewhere without behavioral disturbance: Secondary | ICD-10-CM | POA: Diagnosis not present

## 2015-11-26 DIAGNOSIS — R634 Abnormal weight loss: Secondary | ICD-10-CM | POA: Diagnosis not present

## 2015-11-26 DIAGNOSIS — I10 Essential (primary) hypertension: Secondary | ICD-10-CM | POA: Diagnosis not present

## 2015-11-27 DIAGNOSIS — G301 Alzheimer's disease with late onset: Secondary | ICD-10-CM | POA: Diagnosis not present

## 2015-11-27 DIAGNOSIS — I10 Essential (primary) hypertension: Secondary | ICD-10-CM | POA: Diagnosis not present

## 2015-11-27 DIAGNOSIS — Z681 Body mass index (BMI) 19 or less, adult: Secondary | ICD-10-CM | POA: Diagnosis not present

## 2015-11-27 DIAGNOSIS — R634 Abnormal weight loss: Secondary | ICD-10-CM | POA: Diagnosis not present

## 2015-11-27 DIAGNOSIS — F0280 Dementia in other diseases classified elsewhere without behavioral disturbance: Secondary | ICD-10-CM | POA: Diagnosis not present

## 2015-11-27 DIAGNOSIS — R1310 Dysphagia, unspecified: Secondary | ICD-10-CM | POA: Diagnosis not present

## 2015-12-03 DIAGNOSIS — F0280 Dementia in other diseases classified elsewhere without behavioral disturbance: Secondary | ICD-10-CM | POA: Diagnosis not present

## 2015-12-03 DIAGNOSIS — R1310 Dysphagia, unspecified: Secondary | ICD-10-CM | POA: Diagnosis not present

## 2015-12-03 DIAGNOSIS — G301 Alzheimer's disease with late onset: Secondary | ICD-10-CM | POA: Diagnosis not present

## 2015-12-03 DIAGNOSIS — I10 Essential (primary) hypertension: Secondary | ICD-10-CM | POA: Diagnosis not present

## 2015-12-03 DIAGNOSIS — Z681 Body mass index (BMI) 19 or less, adult: Secondary | ICD-10-CM | POA: Diagnosis not present

## 2015-12-03 DIAGNOSIS — R634 Abnormal weight loss: Secondary | ICD-10-CM | POA: Diagnosis not present

## 2015-12-08 DIAGNOSIS — Z681 Body mass index (BMI) 19 or less, adult: Secondary | ICD-10-CM | POA: Diagnosis not present

## 2015-12-08 DIAGNOSIS — R634 Abnormal weight loss: Secondary | ICD-10-CM | POA: Diagnosis not present

## 2015-12-08 DIAGNOSIS — I10 Essential (primary) hypertension: Secondary | ICD-10-CM | POA: Diagnosis not present

## 2015-12-08 DIAGNOSIS — G301 Alzheimer's disease with late onset: Secondary | ICD-10-CM | POA: Diagnosis not present

## 2015-12-08 DIAGNOSIS — R1310 Dysphagia, unspecified: Secondary | ICD-10-CM | POA: Diagnosis not present

## 2015-12-08 DIAGNOSIS — F0280 Dementia in other diseases classified elsewhere without behavioral disturbance: Secondary | ICD-10-CM | POA: Diagnosis not present

## 2015-12-10 DIAGNOSIS — I10 Essential (primary) hypertension: Secondary | ICD-10-CM | POA: Diagnosis not present

## 2015-12-10 DIAGNOSIS — R1310 Dysphagia, unspecified: Secondary | ICD-10-CM | POA: Diagnosis not present

## 2015-12-10 DIAGNOSIS — R634 Abnormal weight loss: Secondary | ICD-10-CM | POA: Diagnosis not present

## 2015-12-10 DIAGNOSIS — Z681 Body mass index (BMI) 19 or less, adult: Secondary | ICD-10-CM | POA: Diagnosis not present

## 2015-12-10 DIAGNOSIS — G301 Alzheimer's disease with late onset: Secondary | ICD-10-CM | POA: Diagnosis not present

## 2015-12-10 DIAGNOSIS — F0280 Dementia in other diseases classified elsewhere without behavioral disturbance: Secondary | ICD-10-CM | POA: Diagnosis not present

## 2015-12-16 DIAGNOSIS — F0280 Dementia in other diseases classified elsewhere without behavioral disturbance: Secondary | ICD-10-CM | POA: Diagnosis not present

## 2015-12-16 DIAGNOSIS — G301 Alzheimer's disease with late onset: Secondary | ICD-10-CM | POA: Diagnosis not present

## 2015-12-16 DIAGNOSIS — R1310 Dysphagia, unspecified: Secondary | ICD-10-CM | POA: Diagnosis not present

## 2015-12-16 DIAGNOSIS — I10 Essential (primary) hypertension: Secondary | ICD-10-CM | POA: Diagnosis not present

## 2015-12-16 DIAGNOSIS — R634 Abnormal weight loss: Secondary | ICD-10-CM | POA: Diagnosis not present

## 2015-12-16 DIAGNOSIS — Z681 Body mass index (BMI) 19 or less, adult: Secondary | ICD-10-CM | POA: Diagnosis not present

## 2015-12-18 ENCOUNTER — Telehealth: Payer: Self-pay

## 2015-12-18 IMAGING — CT CT ABD-PELV W/O CM
2 of 4 series · 14 of 46 positions shown, 16 images · non-contrast
Comparison: CT of the abdomen and pelvis from 06/21/2014

CLINICAL DATA: Status post fall. Dizziness, back pain and abdominal
pain. Initial encounter.

EXAM:
CT ABDOMEN AND PELVIS WITHOUT CONTRAST
TECHNIQUE: Multidetector CT imaging of the abdomen and pelvis was performed
following the standard protocol without IV contrast.

[Series 2: abd/pelvis 5.0 b31f · axial · 0.74mm/px · z∈[+613,+983]mm · 11 of 90 slices shown, 13 images]
[im 8/90  soft-tissue]
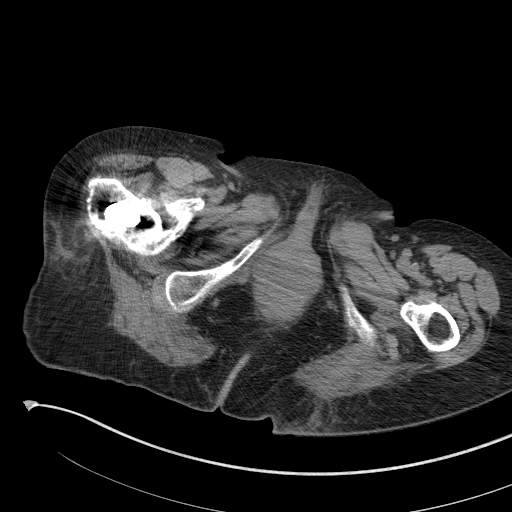
[im 8/90  bone]
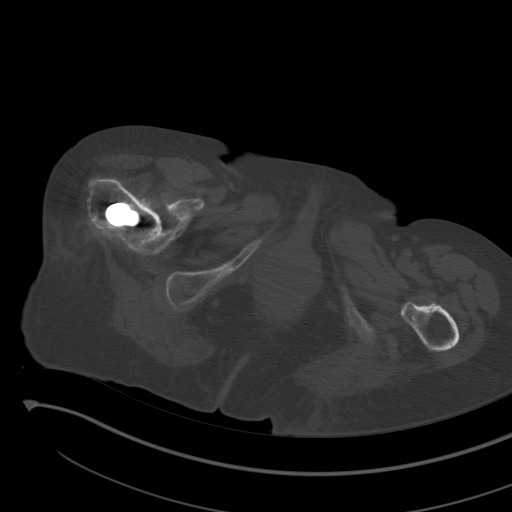
[im 15/90  soft-tissue]
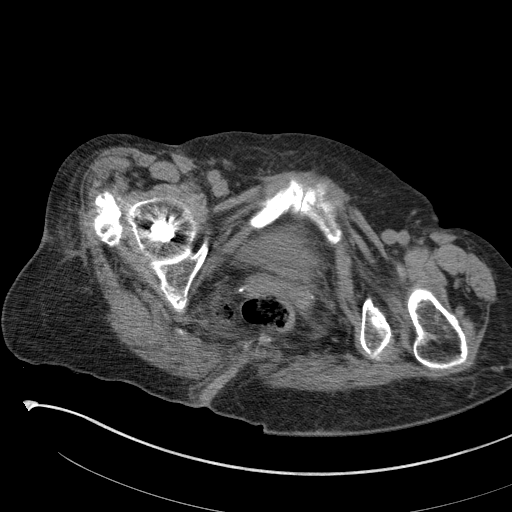
[im 22/90  soft-tissue]
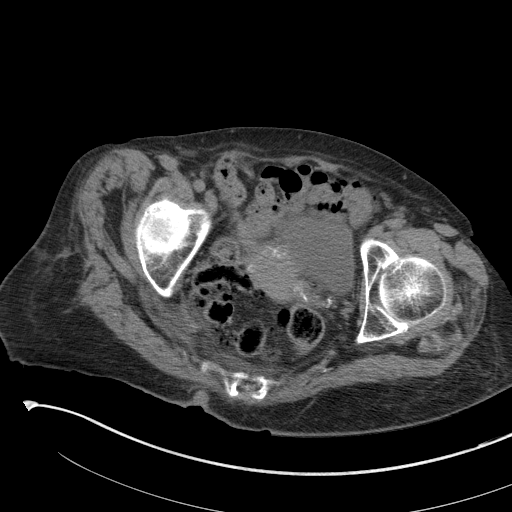
[im 29/90  soft-tissue]
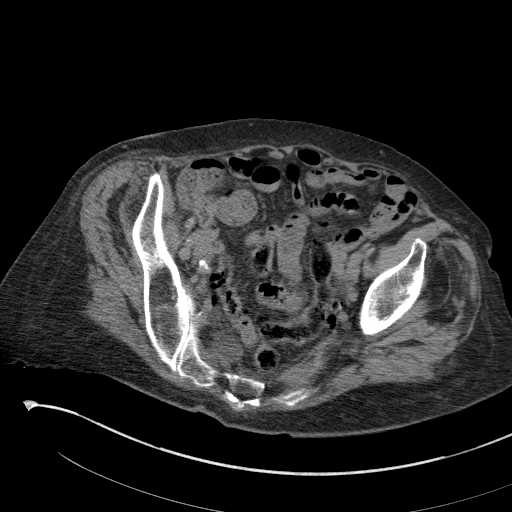
[im 36/90  soft-tissue]
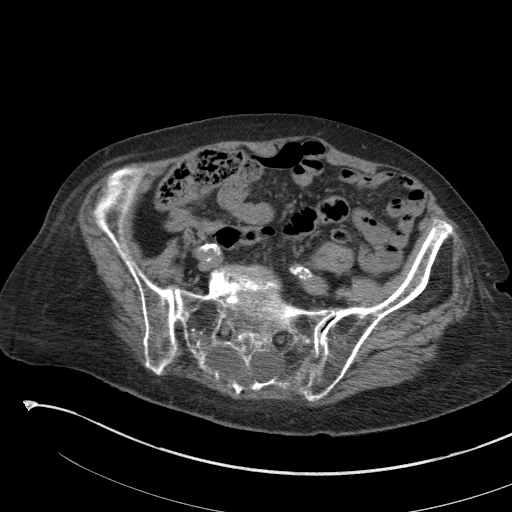
[im 47/90  soft-tissue]
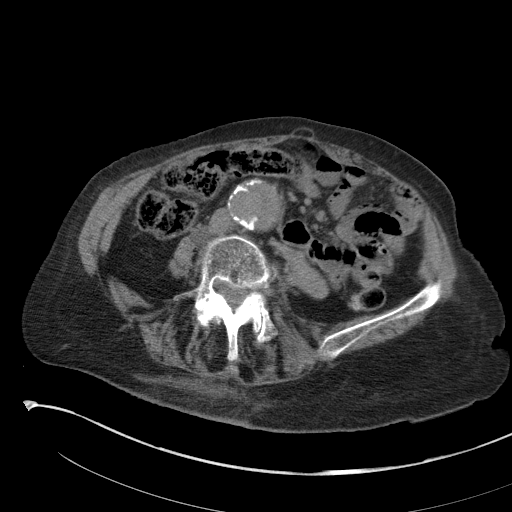
[im 54/90  soft-tissue]
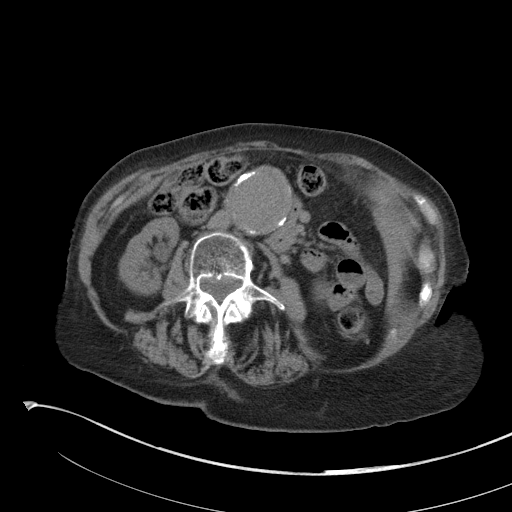
[im 61/90  soft-tissue]
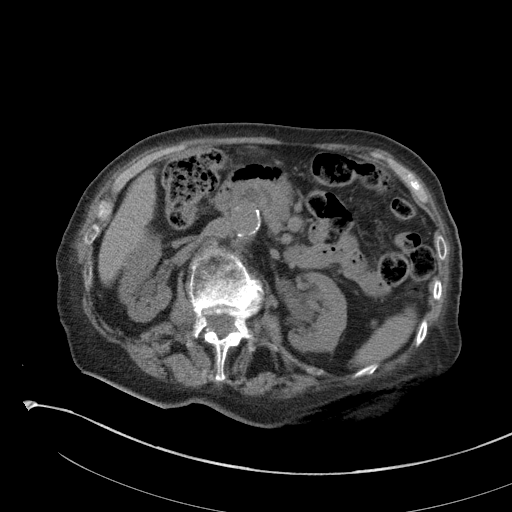
[im 68/90  soft-tissue]
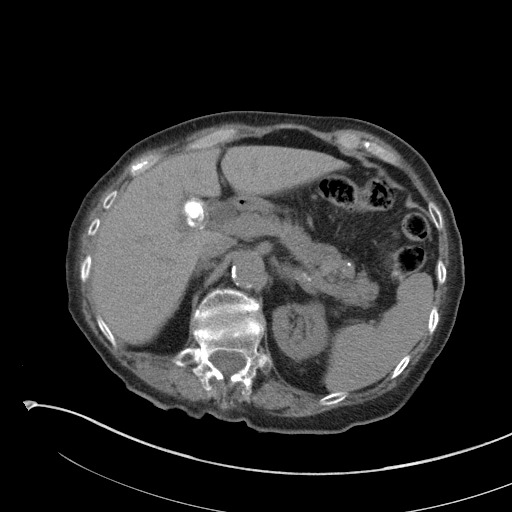
[im 68/90  bone]
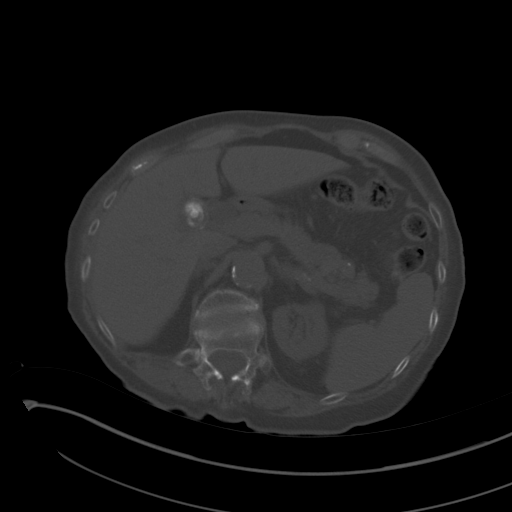
[im 75/90  soft-tissue]
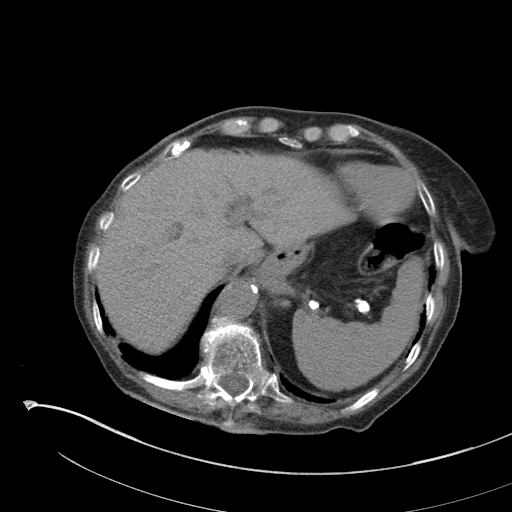
[im 82/90  soft-tissue]
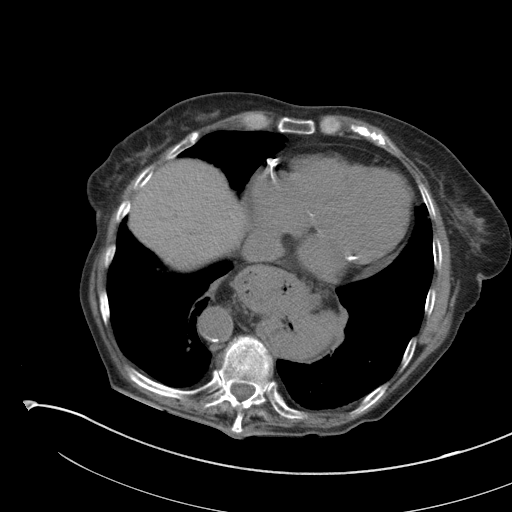

[Series 5: abd/pelvis 3.0 coronal · coronal · 0.68mm/px · 3 of 84 slices shown]
[im 28/84  soft-tissue]
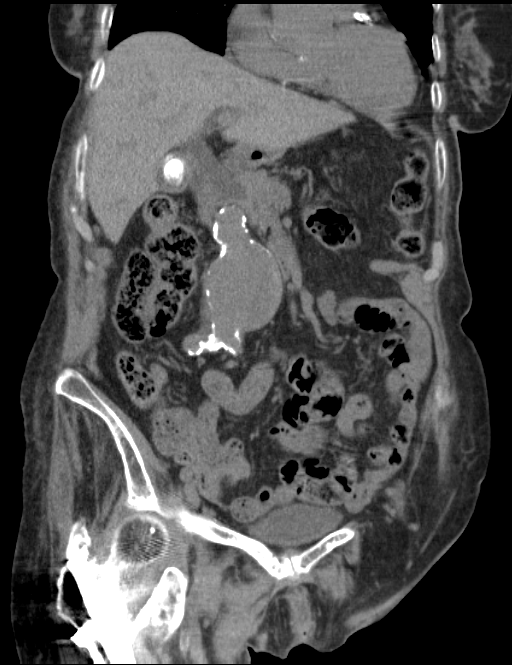
[im 37/84  soft-tissue]
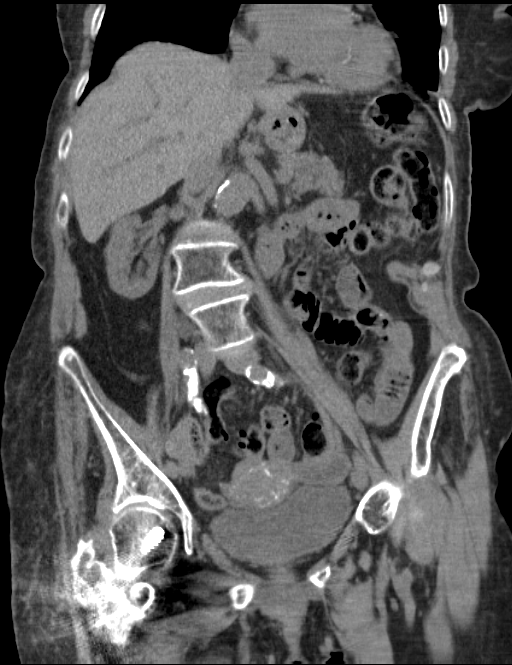
[im 47/84  soft-tissue]
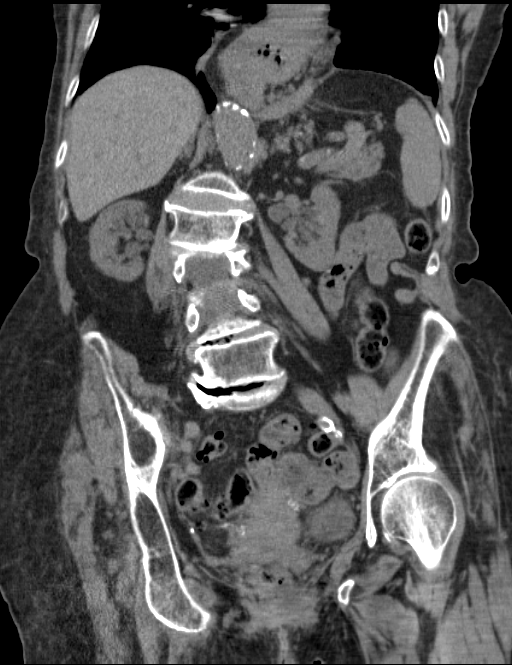

[14 of 46 positions shown; findings below may reference images not displayed]

FINDINGS: Mild bibasilar atelectasis is noted. A relatively large decompressed
hiatal hernia is seen. Diffuse coronary artery calcifications are
noted.

The liver and spleen are unremarkable in appearance. A large stone
is noted within the decompressed gallbladder. There is persistent
dilatation of the common bile duct, measuring 1.3 cm in diameter,
similar in appearance to the prior study. The pancreas and adrenal
glands are grossly unremarkable.

Prominent bilateral renal parapelvic cysts are seen. There is no
definite evidence of hydronephrosis. Scattered tiny calcifications
at the right kidney are thought to be vascular in nature. No renal
or ureteral stones are seen. No perinephric stranding is
appreciated.

No free fluid is identified. There is herniation of a short segment
of distal ileum into a right inguinal hernia, without evidence for
obstruction or strangulation. No significant associated soft tissue
inflammation is seen at this time. The remaining small bowel loops
are grossly unremarkable. The stomach is within normal limits. No
acute vascular abnormalities are seen.

Relatively diffuse calcification is seen along the abdominal aorta
and its branches. The patient's infrarenal abdominal aortic aneurysm
measures 5.4 cm in AP dimension and 5.1 cm in transverse dimension,
slightly increased in size from the prior study. The lumen is not
well assessed without contrast, but appears grossly stable.

The appendix is normal in caliber, without evidence of appendicitis.
Scattered diverticulosis is noted along the splenic flexure of the
colon, and along the descending and sigmoid colon, without evidence
of diverticulitis.

The bladder is mildly distended and grossly unremarkable. The uterus
is unremarkable in appearance. The ovaries are grossly symmetric. No
suspicious adnexal masses are seen. No inguinal lymphadenopathy is
seen.

No acute osseous abnormalities are identified. The patient's right
hip arthroplasty is grossly unremarkable in appearance, though
incompletely imaged. There is mild chronic compression deformity
involving vertebral body L2, and multilevel vacuum phenomenon is
noted along the lower thoracic and lumbar spine. There is grade 1
retrolisthesis of T12 on L1, and grade 1 anterolisthesis of L4 on
L5, reflecting underlying facet disease.
IMPRESSION: 1. No evidence of traumatic injury to the abdomen or pelvis.
2. New herniation of a short segment of distal ileum into a right
inguinal hernia, without evidence for obstruction or strangulation.
3. Stable dilatation of the common bile duct to 1.3 cm, similar in
appearance to the prior study and possibly reflecting the patient's
baseline.
4. Cholelithiasis; gallbladder otherwise unremarkable.
5. Infrarenal abdominal aortic aneurysm measures 5.4 cm in AP
dimension and 5.1 cm in transverse dimension, slightly increased in
size from the prior study. The lumen is not well assessed without
contrast, but appears grossly stable. Underlying diffuse
calcification along the abdominal aorta and its branches. Recommend
followup by abdomen and pelvis CTA in 3-6 months, and vascular
surgery referral/consultation if not already obtained. This
recommendation follows ACR consensus guidelines: White Paper of the
ACR Incidental Findings Committee II on Vascular Findings. [HOSPITAL] 7041; [DATE].
6. Relatively large decompressed hiatal hernia seen.
7. Diffuse coronary artery calcifications seen.
8. Scattered diverticulosis noted along the splenic flexure of the
colon, and along the descending and sigmoid colon, without evidence
of diverticulitis.
9. Prominent bilateral renal parapelvic cysts noted.
10. Mild bibasilar atelectasis seen.
11. Mild diffuse degenerative change along the lumbar spine, with
mild chronic compression deformity at vertebral body L2.

## 2015-12-18 NOTE — Telephone Encounter (Signed)
Orders received from [facility] for wound care.  Orders numbered and placed in PCPs red folder for review and signature.

## 2015-12-21 NOTE — Telephone Encounter (Signed)
Form signed and faxed back.  Fax confirmation received.

## 2015-12-23 DIAGNOSIS — G301 Alzheimer's disease with late onset: Secondary | ICD-10-CM | POA: Diagnosis not present

## 2015-12-23 DIAGNOSIS — Z681 Body mass index (BMI) 19 or less, adult: Secondary | ICD-10-CM | POA: Diagnosis not present

## 2015-12-23 DIAGNOSIS — F0280 Dementia in other diseases classified elsewhere without behavioral disturbance: Secondary | ICD-10-CM | POA: Diagnosis not present

## 2015-12-23 DIAGNOSIS — I10 Essential (primary) hypertension: Secondary | ICD-10-CM | POA: Diagnosis not present

## 2015-12-23 DIAGNOSIS — R634 Abnormal weight loss: Secondary | ICD-10-CM | POA: Diagnosis not present

## 2015-12-23 DIAGNOSIS — R1310 Dysphagia, unspecified: Secondary | ICD-10-CM | POA: Diagnosis not present

## 2015-12-24 DIAGNOSIS — I10 Essential (primary) hypertension: Secondary | ICD-10-CM | POA: Diagnosis not present

## 2015-12-24 DIAGNOSIS — F0280 Dementia in other diseases classified elsewhere without behavioral disturbance: Secondary | ICD-10-CM | POA: Diagnosis not present

## 2015-12-24 DIAGNOSIS — R1310 Dysphagia, unspecified: Secondary | ICD-10-CM | POA: Diagnosis not present

## 2015-12-24 DIAGNOSIS — R634 Abnormal weight loss: Secondary | ICD-10-CM | POA: Diagnosis not present

## 2015-12-24 DIAGNOSIS — Z681 Body mass index (BMI) 19 or less, adult: Secondary | ICD-10-CM | POA: Diagnosis not present

## 2015-12-24 DIAGNOSIS — G301 Alzheimer's disease with late onset: Secondary | ICD-10-CM | POA: Diagnosis not present

## 2015-12-27 DIAGNOSIS — G301 Alzheimer's disease with late onset: Secondary | ICD-10-CM | POA: Diagnosis not present

## 2015-12-27 DIAGNOSIS — R1310 Dysphagia, unspecified: Secondary | ICD-10-CM | POA: Diagnosis not present

## 2015-12-27 DIAGNOSIS — Z681 Body mass index (BMI) 19 or less, adult: Secondary | ICD-10-CM | POA: Diagnosis not present

## 2015-12-27 DIAGNOSIS — I10 Essential (primary) hypertension: Secondary | ICD-10-CM | POA: Diagnosis not present

## 2015-12-27 DIAGNOSIS — F0280 Dementia in other diseases classified elsewhere without behavioral disturbance: Secondary | ICD-10-CM | POA: Diagnosis not present

## 2015-12-27 DIAGNOSIS — R634 Abnormal weight loss: Secondary | ICD-10-CM | POA: Diagnosis not present

## 2015-12-31 DIAGNOSIS — I10 Essential (primary) hypertension: Secondary | ICD-10-CM | POA: Diagnosis not present

## 2015-12-31 DIAGNOSIS — F0280 Dementia in other diseases classified elsewhere without behavioral disturbance: Secondary | ICD-10-CM | POA: Diagnosis not present

## 2015-12-31 DIAGNOSIS — R1310 Dysphagia, unspecified: Secondary | ICD-10-CM | POA: Diagnosis not present

## 2015-12-31 DIAGNOSIS — G301 Alzheimer's disease with late onset: Secondary | ICD-10-CM | POA: Diagnosis not present

## 2015-12-31 DIAGNOSIS — Z681 Body mass index (BMI) 19 or less, adult: Secondary | ICD-10-CM | POA: Diagnosis not present

## 2015-12-31 DIAGNOSIS — R634 Abnormal weight loss: Secondary | ICD-10-CM | POA: Diagnosis not present

## 2016-01-08 DIAGNOSIS — Z681 Body mass index (BMI) 19 or less, adult: Secondary | ICD-10-CM | POA: Diagnosis not present

## 2016-01-08 DIAGNOSIS — R1310 Dysphagia, unspecified: Secondary | ICD-10-CM | POA: Diagnosis not present

## 2016-01-08 DIAGNOSIS — R634 Abnormal weight loss: Secondary | ICD-10-CM | POA: Diagnosis not present

## 2016-01-08 DIAGNOSIS — G301 Alzheimer's disease with late onset: Secondary | ICD-10-CM | POA: Diagnosis not present

## 2016-01-08 DIAGNOSIS — F0280 Dementia in other diseases classified elsewhere without behavioral disturbance: Secondary | ICD-10-CM | POA: Diagnosis not present

## 2016-01-08 DIAGNOSIS — I10 Essential (primary) hypertension: Secondary | ICD-10-CM | POA: Diagnosis not present

## 2016-01-15 ENCOUNTER — Telehealth: Payer: Self-pay | Admitting: Family Medicine

## 2016-01-15 ENCOUNTER — Encounter: Payer: Self-pay | Admitting: Family

## 2016-01-15 DIAGNOSIS — R634 Abnormal weight loss: Secondary | ICD-10-CM | POA: Diagnosis not present

## 2016-01-15 DIAGNOSIS — F0280 Dementia in other diseases classified elsewhere without behavioral disturbance: Secondary | ICD-10-CM | POA: Diagnosis not present

## 2016-01-15 DIAGNOSIS — G301 Alzheimer's disease with late onset: Secondary | ICD-10-CM | POA: Diagnosis not present

## 2016-01-15 DIAGNOSIS — Z681 Body mass index (BMI) 19 or less, adult: Secondary | ICD-10-CM | POA: Diagnosis not present

## 2016-01-15 DIAGNOSIS — R1310 Dysphagia, unspecified: Secondary | ICD-10-CM | POA: Diagnosis not present

## 2016-01-15 DIAGNOSIS — I10 Essential (primary) hypertension: Secondary | ICD-10-CM | POA: Diagnosis not present

## 2016-01-15 MED ORDER — METRONIDAZOLE 0.75 % VA GEL: 1.0000 | Freq: Every day | VAGINAL | 0 refills | Status: AC

## 2016-01-15 MED ORDER — CIPROFLOXACIN HCL 250 MG PO TABS: 250.0000 mg | ORAL_TABLET | Freq: Two times a day (BID) | ORAL | 0 refills | Status: DC

## 2016-01-15 NOTE — Telephone Encounter (Signed)
Verbal given to check a UA and culture. [place] [place] is aware that the medications have been faxed as well.    KP

## 2016-01-15 NOTE — Telephone Encounter (Signed)
Rx sent for cipro for UTI and metrogel for vaginitis. Please let us know if symptoms do not improve.

## 2016-01-15 NOTE — Telephone Encounter (Signed)
Caller name: Wallie Char with [facility] Can be reached: [phone]  Reason for call: pt having increase freq of urination and green vaginal discharge, started Wednesday. Needs VO for test and possible medication to treat.

## 2016-01-15 NOTE — Telephone Encounter (Signed)
Please advise      KP 

## 2016-01-20 DIAGNOSIS — F0280 Dementia in other diseases classified elsewhere without behavioral disturbance: Secondary | ICD-10-CM | POA: Diagnosis not present

## 2016-01-20 DIAGNOSIS — Z681 Body mass index (BMI) 19 or less, adult: Secondary | ICD-10-CM | POA: Diagnosis not present

## 2016-01-20 DIAGNOSIS — R1310 Dysphagia, unspecified: Secondary | ICD-10-CM | POA: Diagnosis not present

## 2016-01-20 DIAGNOSIS — G301 Alzheimer's disease with late onset: Secondary | ICD-10-CM | POA: Diagnosis not present

## 2016-01-20 DIAGNOSIS — I10 Essential (primary) hypertension: Secondary | ICD-10-CM | POA: Diagnosis not present

## 2016-01-20 DIAGNOSIS — R634 Abnormal weight loss: Secondary | ICD-10-CM | POA: Diagnosis not present

## 2016-01-21 DIAGNOSIS — R634 Abnormal weight loss: Secondary | ICD-10-CM | POA: Diagnosis not present

## 2016-01-21 DIAGNOSIS — G301 Alzheimer's disease with late onset: Secondary | ICD-10-CM | POA: Diagnosis not present

## 2016-01-21 DIAGNOSIS — F0280 Dementia in other diseases classified elsewhere without behavioral disturbance: Secondary | ICD-10-CM | POA: Diagnosis not present

## 2016-01-21 DIAGNOSIS — R1310 Dysphagia, unspecified: Secondary | ICD-10-CM | POA: Diagnosis not present

## 2016-01-21 DIAGNOSIS — Z681 Body mass index (BMI) 19 or less, adult: Secondary | ICD-10-CM | POA: Diagnosis not present

## 2016-01-21 DIAGNOSIS — I10 Essential (primary) hypertension: Secondary | ICD-10-CM | POA: Diagnosis not present

## 2016-01-27 DIAGNOSIS — R634 Abnormal weight loss: Secondary | ICD-10-CM | POA: Diagnosis not present

## 2016-01-27 DIAGNOSIS — Z681 Body mass index (BMI) 19 or less, adult: Secondary | ICD-10-CM | POA: Diagnosis not present

## 2016-01-27 DIAGNOSIS — F0280 Dementia in other diseases classified elsewhere without behavioral disturbance: Secondary | ICD-10-CM | POA: Diagnosis not present

## 2016-01-27 DIAGNOSIS — R1310 Dysphagia, unspecified: Secondary | ICD-10-CM | POA: Diagnosis not present

## 2016-01-27 DIAGNOSIS — G301 Alzheimer's disease with late onset: Secondary | ICD-10-CM | POA: Diagnosis not present

## 2016-01-27 DIAGNOSIS — I10 Essential (primary) hypertension: Secondary | ICD-10-CM | POA: Diagnosis not present

## 2016-01-28 DIAGNOSIS — R634 Abnormal weight loss: Secondary | ICD-10-CM | POA: Diagnosis not present

## 2016-01-28 DIAGNOSIS — R1310 Dysphagia, unspecified: Secondary | ICD-10-CM | POA: Diagnosis not present

## 2016-01-28 DIAGNOSIS — G301 Alzheimer's disease with late onset: Secondary | ICD-10-CM | POA: Diagnosis not present

## 2016-01-28 DIAGNOSIS — Z681 Body mass index (BMI) 19 or less, adult: Secondary | ICD-10-CM | POA: Diagnosis not present

## 2016-01-28 DIAGNOSIS — I10 Essential (primary) hypertension: Secondary | ICD-10-CM | POA: Diagnosis not present

## 2016-01-28 DIAGNOSIS — F0280 Dementia in other diseases classified elsewhere without behavioral disturbance: Secondary | ICD-10-CM | POA: Diagnosis not present

## 2016-01-30 DIAGNOSIS — I10 Essential (primary) hypertension: Secondary | ICD-10-CM | POA: Diagnosis not present

## 2016-01-30 DIAGNOSIS — R634 Abnormal weight loss: Secondary | ICD-10-CM | POA: Diagnosis not present

## 2016-01-30 DIAGNOSIS — F0280 Dementia in other diseases classified elsewhere without behavioral disturbance: Secondary | ICD-10-CM | POA: Diagnosis not present

## 2016-01-30 DIAGNOSIS — G301 Alzheimer's disease with late onset: Secondary | ICD-10-CM | POA: Diagnosis not present

## 2016-01-30 DIAGNOSIS — Z681 Body mass index (BMI) 19 or less, adult: Secondary | ICD-10-CM | POA: Diagnosis not present

## 2016-01-30 DIAGNOSIS — R1310 Dysphagia, unspecified: Secondary | ICD-10-CM | POA: Diagnosis not present

## 2016-02-08 ENCOUNTER — Ambulatory Visit (INDEPENDENT_AMBULATORY_CARE_PROVIDER_SITE_OTHER): Payer: Medicare Other | Admitting: Family Medicine

## 2016-02-08 ENCOUNTER — Other Ambulatory Visit (HOSPITAL_COMMUNITY)
Admission: RE | Admit: 2016-02-08 | Discharge: 2016-02-08 | Disposition: A | Discharge status: Home / Self Care | Payer: Medicare Other | Source: Ambulatory Visit | Attending: Family Medicine | Admitting: Family Medicine

## 2016-02-08 ENCOUNTER — Other Ambulatory Visit: Payer: Self-pay | Admitting: Family Medicine

## 2016-02-08 ENCOUNTER — Other Ambulatory Visit: Payer: Medicare Other

## 2016-02-08 ENCOUNTER — Encounter: Payer: Self-pay | Admitting: Family Medicine

## 2016-02-08 VITALS — BP 120/78 | HR 65 | Temp 98.1°F | Resp 16 | Ht 64.0 in | Wt 95.4 lb

## 2016-02-08 DIAGNOSIS — R3989 Other symptoms and signs involving the genitourinary system: Secondary | ICD-10-CM | POA: Diagnosis not present

## 2016-02-08 DIAGNOSIS — I10 Essential (primary) hypertension: Secondary | ICD-10-CM | POA: Diagnosis not present

## 2016-02-08 DIAGNOSIS — F0280 Dementia in other diseases classified elsewhere without behavioral disturbance: Secondary | ICD-10-CM

## 2016-02-08 DIAGNOSIS — R8299 Other abnormal findings in urine: Secondary | ICD-10-CM

## 2016-02-08 DIAGNOSIS — R8290 Unspecified abnormal findings in urine: Secondary | ICD-10-CM | POA: Diagnosis not present

## 2016-02-08 DIAGNOSIS — N898 Other specified noninflammatory disorders of vagina: Secondary | ICD-10-CM

## 2016-02-08 DIAGNOSIS — E039 Hypothyroidism, unspecified: Secondary | ICD-10-CM

## 2016-02-08 DIAGNOSIS — R82998 Other abnormal findings in urine: Secondary | ICD-10-CM

## 2016-02-08 DIAGNOSIS — N390 Urinary tract infection, site not specified: Secondary | ICD-10-CM

## 2016-02-08 DIAGNOSIS — R319 Hematuria, unspecified: Secondary | ICD-10-CM | POA: Diagnosis not present

## 2016-02-08 DIAGNOSIS — G309 Alzheimer's disease, unspecified: Secondary | ICD-10-CM

## 2016-02-08 DIAGNOSIS — F411 Generalized anxiety disorder: Secondary | ICD-10-CM

## 2016-02-08 LAB — LIPID PANEL
CHOL/HDL RATIO: 3
Cholesterol: 166 mg/dL (ref 0–200)
HDL: 51.6 mg/dL (ref >39.00)
LDL CALC: 96 mg/dL (ref 0–99)
NONHDL: 114.88
TRIGLYCERIDES: 94 mg/dL (ref 0.0–149.0)
VLDL: 18.8 mg/dL (ref 0.0–40.0)

## 2016-02-08 LAB — COMPREHENSIVE METABOLIC PANEL
ALT: 11 U/L (ref 0–35)
AST: 14 U/L (ref 0–37)
Albumin: 3.6 g/dL (ref 3.5–5.2)
Alkaline Phosphatase: 63 U/L (ref 39–117)
BILIRUBIN TOTAL: 0.6 mg/dL (ref 0.2–1.2)
BUN: 24 mg/dL — ABNORMAL HIGH (ref 6–23)
CHLORIDE: 106 meq/L (ref 96–112)
CO2: 31 meq/L (ref 19–32)
CREATININE: 0.93 mg/dL (ref 0.40–1.20)
Calcium: 9.9 mg/dL (ref 8.4–10.5)
GFR: 59.19 mL/min — ABNORMAL LOW (ref >60.00)
GLUCOSE: 106 mg/dL — AB (ref 70–99)
Potassium: 4.3 mEq/L (ref 3.5–5.1)
SODIUM: 141 meq/L (ref 135–145)
Total Protein: 5.9 g/dL — ABNORMAL LOW (ref 6.0–8.3)

## 2016-02-08 LAB — POCT URINALYSIS DIPSTICK
Bilirubin, UA: NEGATIVE
GLUCOSE UA: NEGATIVE
KETONES UA: NEGATIVE
Nitrite, UA: POSITIVE
Protein, UA: 0.15
UROBILINOGEN UA: NEGATIVE
pH, UA: 6

## 2016-02-08 LAB — TSH: TSH: 0.46 u[IU]/mL (ref 0.35–4.50)

## 2016-02-08 MED ORDER — ALPRAZOLAM 0.25 MG PO TABS: ORAL_TABLET | ORAL | 1 refills | Status: DC

## 2016-02-08 MED ORDER — NITROFURANTOIN MONOHYD MACRO 100 MG PO CAPS: 100.0000 mg | ORAL_CAPSULE | Freq: Two times a day (BID) | ORAL | 0 refills | Status: DC

## 2016-02-08 NOTE — Patient Instructions (Signed)

## 2016-02-08 NOTE — Progress Notes (Signed)
Pre visit review using our clinic review tool, if applicable. No additional management support is needed unless otherwise documented below in the visit note. 

## 2016-02-08 NOTE — Progress Notes (Signed)
Virginia Davidson KitchenMarland KitchenMarland Kitchen.....+....Virginia Davidson Kitchenatient ID: [name], female    DOB: [birth year]  Age: 82 y.o. MRN: [id number]    Subjective:  Subjective  HPI Virginia Davidson presents for f/u -- she was d/c from hospice.  Pt is eating 2 meals and she will nibble at dinner.  Sometimes she has snacks in between.    Review of Systems  Constitutional: Positive for fatigue. Negative for activity change, appetite change and unexpected weight change.  Respiratory: Negative for cough and shortness of breath.   Cardiovascular: Negative for chest pain and palpitations.  Psychiatric/Behavioral: Negative for behavioral problems and dysphoric mood. The patient is not nervous/anxious.     History Past Medical History:  Diagnosis Date  . Abdominal aneurysm ([place])   . Dementia   . Diverticulitis   . Glaucoma   . Hypertension   . Thyroid disease   . Urinary incontinence     She has a past surgical history that includes Bladder surgery (N/A) and Hip surgery (Right).   Her family history includes Stroke in her mother.She reports that she has never smoked. She has never used smokeless tobacco. She reports that she does not drink alcohol or use drugs.  Current Outpatient Prescriptions on File Prior to Visit  Medication Sig Dispense Refill  . Calcium Carbonate-Vitamin D 600-200 MG-UNIT CAPS Take 1 tablet by mouth 2 (two) times daily.    . nitroGLYCERIN (NITROSTAT) 0.4 MG SL tablet Place 0.4 mg under the tongue every 5 (five) minutes as needed for chest pain. Reported on 06/01/2015    . ranitidine (ZANTAC) 150 MG tablet Take 1 tablet (150 mg total) by mouth 2 (two) times daily. (Patient taking differently: Take 150 mg by mouth at bedtime. ) 30 tablet 5  . senna-docusate (SENOKOT-S) 8.6-50 MG  tablet Take 1 tablet by mouth daily as needed for mild constipation.    . ciprofloxacin (CIPRO) 250 MG tablet Take 1 tablet (250 mg total) by mouth 2 (two) times daily. (Patient not taking: Reported on 02/08/2016) 6 tablet 0   No current facility-administered medications on file prior to visit.      Objective:  Objective  Physical Exam  Constitutional: She is oriented to person, place, and time. She appears well-developed and well-nourished.  HENT:  Head: Normocephalic and atraumatic.  Eyes: Conjunctivae and EOM are normal.  Neck: Normal range of motion. Neck supple. No JVD present. Carotid bruit is not present. No thyromegaly present.  Cardiovascular: Normal rate, regular rhythm and normal heart sounds.   No murmur heard. Pulmonary/Chest: Effort normal and breath sounds normal. No respiratory distress. She has no wheezes. She has no rales. She exhibits no tenderness.  Musculoskeletal: She exhibits no edema.  Neurological: She is alert and oriented to person, place, and time.  Nursing note and vitals reviewed.  BP 120/78 (BP Location: Left Arm, Patient Position: Sitting, Cuff Size: Normal)   Pulse  65   Temp 98.1 F (36.7 C) (Oral)   Resp 16   Ht 5\' 4"  (1.626 m)   Wt 95 lb 6.4 oz (43.3 kg)   SpO2 95%   BMI 16.38 kg/m  Wt Readings from Last 3 Encounters:  02/08/16 95 lb 6.4 oz (43.3 kg)  10/18/15 90 lb (40.8 kg)  06/04/15 113 lb (51.3 kg)     Lab Results  Component Value Date   WBC 8.8 10/19/2015   HGB 13.8 10/19/2015   HCT 43.0 10/19/2015   PLT 206 10/19/2015   GLUCOSE 106 (H) 02/08/2016   CHOL 166 02/08/2016   TRIG 94.0 02/08/2016   HDL 51.60 02/08/2016   LDLCALC 96 02/08/2016   ALT 11 02/08/2016   AST 14 02/08/2016   NA 141 02/08/2016   K 4.3 02/08/2016   CL 106 02/08/2016   CREATININE 0.93 02/08/2016   BUN 24 (H) 02/08/2016   CO2 31 02/08/2016   TSH 0.46 02/08/2016   INR 1.21 05/23/2015    Dg Elbow Complete Right  Result Date: 10/19/2015 CLINICAL DATA:   Post fall, now with laceration involving the posterior aspect of the right elbow. EXAM: RIGHT ELBOW - COMPLETE 3+ VIEW COMPARISON:  None. FINDINGS: No mobile joint effusion or displaced elbow fracture. Regional soft tissues appear normal. No radiopaque foreign body. Joint spaces are preserved. IMPRESSION: No fracture, elbow joint effusion or radiopaque foreign body. Electronically Signed   By: Sandi Mariscal M.D.   On: 10/19/2015 00:41  Ct Head Wo Contrast  Result Date: 10/19/2015 CLINICAL DATA:  82 year old female with fall EXAM: CT HEAD WITHOUT CONTRAST CT MAXILLOFACIAL WITHOUT CONTRAST CT CERVICAL SPINE WITHOUT CONTRAST TECHNIQUE: Multidetector CT imaging of the head, cervical spine, and maxillofacial structures were performed using the standard protocol without intravenous contrast. Multiplanar CT image reconstructions of the cervical spine and maxillofacial structures were also generated. COMPARISON:  None. FINDINGS: CT HEAD FINDINGS Evaluation of this exam is limited due to motion artifact. There is a right hemispheric subdural hemorrhage measuring up to 4 mm over the right temporal lobe. There is a small right subarachnoid hemorrhage within the sylvian fissure. No intraventricular hemorrhage. There is dilatation of the ventricles and sulci compatible with age-related atrophy. Periventricular and deep white matter chronic microvascular ischemic changes noted. There is no midline shift. The visualized paranasal sinuses and mastoid air cells are clear. No calvarial fracture. CT MAXILLOFACIAL FINDINGS No acute fracture of the facial bone. The maxilla, mandible, and pterygoid plates are intact. The globes, retro-orbital fat, and orbital walls are preserved. There is right facial and periorbital hematoma. CT CERVICAL SPINE FINDINGS There is no acute fracture or subluxation of the cervical spine.There is osteopenia with multilevel degenerative changes and facet hypertrophy.The odontoid and spinous processes are  intact.There is normal anatomic alignment of the C1-C2 lateral masses. The visualized soft tissues appear unremarkable. IMPRESSION: A right hemispheric subdural hemorrhage as well as small amount of subarachnoid hemorrhage in the right sylvian fissure. Close follow-up recommended. No acute/traumatic cervical spine or facial bone pathology. These results were called by telephone at the time of interpretation on 10/19/2015 at 12:37 am to Dr. Thayer Jew , who verbally acknowledged these results. Electronically Signed   By: Anner Crete M.D.   On: 10/19/2015 00:37  Ct Cervical Spine Wo Contrast  Result Date: 10/19/2015 CLINICAL DATA:  82 year old female with fall EXAM: CT HEAD WITHOUT CONTRAST CT MAXILLOFACIAL WITHOUT CONTRAST CT CERVICAL SPINE WITHOUT CONTRAST TECHNIQUE: Multidetector CT imaging of the head, cervical spine, and  maxillofacial structures were performed using the standard protocol without intravenous contrast. Multiplanar CT image reconstructions of the cervical spine and maxillofacial structures were also generated. COMPARISON:  None. FINDINGS: CT HEAD FINDINGS Evaluation of this exam is limited due to motion artifact. There is a right hemispheric subdural hemorrhage measuring up to 4 mm over the right temporal lobe. There is a small right subarachnoid hemorrhage within the sylvian fissure. No intraventricular hemorrhage. There is dilatation of the ventricles and sulci compatible with age-related atrophy. Periventricular and deep white matter chronic microvascular ischemic changes noted. There is no midline shift. The visualized paranasal sinuses and mastoid air cells are clear. No calvarial fracture. CT MAXILLOFACIAL FINDINGS No acute fracture of the facial bone. The maxilla, mandible, and pterygoid plates are intact. The globes, retro-orbital fat, and orbital walls are preserved. There is right facial and periorbital hematoma. CT CERVICAL SPINE FINDINGS There is no acute fracture or  subluxation of the cervical spine.There is osteopenia with multilevel degenerative changes and facet hypertrophy.The odontoid and spinous processes are intact.There is normal anatomic alignment of the C1-C2 lateral masses. The visualized soft tissues appear unremarkable. IMPRESSION: A right hemispheric subdural hemorrhage as well as small amount of subarachnoid hemorrhage in the right sylvian fissure. Close follow-up recommended. No acute/traumatic cervical spine or facial bone pathology. These results were called by telephone at the time of interpretation on 10/19/2015 at 12:37 am to Dr. Thayer Jew , who verbally acknowledged these results. Electronically Signed   By: Anner Crete M.D.   On: 10/19/2015 00:37  Dg Hips Bilat With Pelvis 2v  Result Date: 10/19/2015 CLINICAL DATA:  Post fall, now with pain involving the bilateral hips. EXAM: DG HIP (WITH OR WITHOUT PELVIS) 2V BILAT COMPARISON:  CT abdomen pelvis - 09/26/2014 FINDINGS: Post intra medullary rod fixation of the right femur with dynamic screw fixation of the right femoral neck. No evidence of hardware failure loosening. Sequela of prior avulsion injury involving the right lesser trochanter, unchanged. Osteopenia.  No definite displaced hip or pelvic fracture. Bilateral hip joint spaces appear preserved. No evidence avascular necrosis. Regional soft tissues appear normal.  No radiopaque foreign body. IMPRESSION: 1. Osteopenia without definitive displaced hip or pelvic fracture. 2. Post intra medullary rod fixation of the right femur and dynamic screw fixation of the right femoral neck without evidence of hardware failure loosening. Electronically Signed   By: Sandi Mariscal M.D.   On: 10/19/2015 00:34  Ct Maxillofacial Wo Cm  Result Date: 10/19/2015 CLINICAL DATA:  82 year old female with fall EXAM: CT HEAD WITHOUT CONTRAST CT MAXILLOFACIAL WITHOUT CONTRAST CT CERVICAL SPINE WITHOUT CONTRAST TECHNIQUE: Multidetector CT imaging of the head,  cervical spine, and maxillofacial structures were performed using the standard protocol without intravenous contrast. Multiplanar CT image reconstructions of the cervical spine and maxillofacial structures were also generated. COMPARISON:  None. FINDINGS: CT HEAD FINDINGS Evaluation of this exam is limited due to motion artifact. There is a right hemispheric subdural hemorrhage measuring up to 4 mm over the right temporal lobe. There is a small right subarachnoid hemorrhage within the sylvian fissure. No intraventricular hemorrhage. There is dilatation of the ventricles and sulci compatible with age-related atrophy. Periventricular and deep white matter chronic microvascular ischemic changes noted. There is no midline shift. The visualized paranasal sinuses and mastoid air cells are clear. No calvarial fracture. CT MAXILLOFACIAL FINDINGS No acute fracture of the facial bone. The maxilla, mandible, and pterygoid plates are intact. The globes, retro-orbital fat, and orbital walls are preserved. There is right  facial and periorbital hematoma. CT CERVICAL SPINE FINDINGS There is no acute fracture or subluxation of the cervical spine.There is osteopenia with multilevel degenerative changes and facet hypertrophy.The odontoid and spinous processes are intact.There is normal anatomic alignment of the C1-C2 lateral masses. The visualized soft tissues appear unremarkable. IMPRESSION: A right hemispheric subdural hemorrhage as well as small amount of subarachnoid hemorrhage in the right sylvian fissure. Close follow-up recommended. No acute/traumatic cervical spine or facial bone pathology. These results were called by telephone at the time of interpretation on 10/19/2015 at 12:37 am to Dr. Thayer Jew , who verbally acknowledged these results. Electronically Signed   By: Anner Crete M.D.   On: 10/19/2015 00:37    Assessment & Plan:  Plan  I am having Virginia Davidson start on nitrofurantoin (macrocrystal-monohydrate).  I am also having her maintain her Calcium Carbonate-Vitamin D, nitroGLYCERIN, ranitidine, senna-docusate, ciprofloxacin, ALPRAZolam, memantine, lisinopril, and levothyroxine.  Meds ordered this encounter  Medications  . nitrofurantoin, macrocrystal-monohydrate, (MACROBID) 100 MG capsule    Sig: Take 1 capsule (100 mg total) by mouth 2 (two) times daily.    Dispense:  14 capsule    Refill:  0  . ALPRAZolam (XANAX) 0.25 MG tablet    Sig: TAKE ONE TABLET 3 TIMES A DAY AS NEEDED.    Dispense:  30 tablet    Refill:  1  . memantine (NAMENDA XR) 7 MG CP24 24 hr capsule    Sig: Take 1 capsule (7 mg total) by mouth daily.    Dispense:  30 capsule    Refill:  2  . lisinopril (PRINIVIL,ZESTRIL) 10 MG tablet    Sig: TAKE ONE (1) TABLET BY MOUTH EVERY DAY    Dispense:  90 tablet    Refill:  1  . levothyroxine (SYNTHROID, LEVOTHROID) 75 MCG tablet    Sig: Take 1 tablet (75 mcg total) by mouth daily.    Dispense:  30 tablet    Refill:  2    This is a new dose based on recent labs D/C PREVIOUS SCRIPTS FOR THIS MEDICATION    Problem List Items Addressed This Visit      Unprioritized   Dementia   Relevant Medications   ALPRAZolam (XANAX) 0.25 MG tablet   memantine (NAMENDA XR) 7 MG CP24 24 hr capsule   UTI (urinary tract infection)   Relevant Medications   nitrofurantoin, macrocrystal-monohydrate, (MACROBID) 100 MG capsule   Hypothyroidism   Relevant Medications   levothyroxine (SYNTHROID, LEVOTHROID) 75 MCG tablet    Other Visit Diagnoses    Abnormal urine odor    -  Primary   Relevant Orders   POCT Urinalysis Dipstick (Completed)   Hypertension, unspecified type       Relevant Medications   lisinopril (PRINIVIL,ZESTRIL) 10 MG tablet   Vaginal odor       Relevant Orders   Urine cytology ancillary only (Completed)   Generalized anxiety disorder       Relevant Medications   ALPRAZolam (XANAX) 0.25 MG tablet   Urine abnormality       Relevant Orders   Urine Culture (Completed)    Hematuria, unspecified type       Relevant Orders   Urine Culture (Completed)   Urine leukocytes       Relevant Orders   Urine Culture (Completed)      Follow-up: Return in about 3 months (around 05/10/2016).  Ann Held, DO

## 2016-02-10 ENCOUNTER — Telehealth: Payer: Self-pay | Admitting: Family Medicine

## 2016-02-10 LAB — URINE CULTURE

## 2016-02-10 LAB — URINE CYTOLOGY ANCILLARY ONLY
BACTERIAL VAGINITIS: NEGATIVE
CANDIDA VAGINITIS: NEGATIVE

## 2016-02-10 NOTE — Telephone Encounter (Signed)
Caller name:Diane Napier Relationship to patient: Can be reached:[phone] Pharmacy:  Reason for call:Requesting urine culture results

## 2016-02-10 NOTE — Telephone Encounter (Signed)
Spoke with pt's daughter-in-law, Shauna Hugh. She states pt was given Rx for Cipro yesterday. Advised her that culture did grow bacteria. Pt should continue current antibiotic and we will let her know if any further recommendations are made.  Please advise?

## 2016-02-11 NOTE — Telephone Encounter (Signed)
Pt's daughter in law previously notified of below. Will await final culture report.

## 2016-02-11 NOTE — Telephone Encounter (Signed)
Culture is not done yet but it is growing bacteria-- con't abx We will let her know when final report comes in

## 2016-02-14 MED ORDER — LEVOTHYROXINE SODIUM 75 MCG PO TABS: 75.0000 ug | ORAL_TABLET | Freq: Every day | ORAL | 2 refills | Status: DC

## 2016-02-14 MED ORDER — MEMANTINE HCL ER 7 MG PO CP24: 7.0000 mg | ORAL_CAPSULE | Freq: Every day | ORAL | 2 refills | Status: DC

## 2016-02-14 MED ORDER — LISINOPRIL 10 MG PO TABS: ORAL_TABLET | ORAL | 1 refills | Status: DC

## 2016-02-21 ENCOUNTER — Encounter (HOSPITAL_BASED_OUTPATIENT_CLINIC_OR_DEPARTMENT_OTHER): Payer: Self-pay | Admitting: *Deleted

## 2016-02-21 ENCOUNTER — Emergency Department (HOSPITAL_BASED_OUTPATIENT_CLINIC_OR_DEPARTMENT_OTHER): Payer: Medicare Other

## 2016-02-21 ENCOUNTER — Emergency Department (HOSPITAL_BASED_OUTPATIENT_CLINIC_OR_DEPARTMENT_OTHER)
Admission: EM | Admit: 2016-02-21 | Discharge: 2016-02-21 | Disposition: A | Discharge status: Home / Self Care | Payer: Medicare Other | Attending: Emergency Medicine | Admitting: Emergency Medicine

## 2016-02-21 DIAGNOSIS — R05 Cough: Secondary | ICD-10-CM | POA: Diagnosis not present

## 2016-02-21 DIAGNOSIS — E039 Hypothyroidism, unspecified: Secondary | ICD-10-CM | POA: Diagnosis not present

## 2016-02-21 DIAGNOSIS — Z79899 Other long term (current) drug therapy: Secondary | ICD-10-CM | POA: Diagnosis not present

## 2016-02-21 DIAGNOSIS — J069 Acute upper respiratory infection, unspecified: Secondary | ICD-10-CM | POA: Diagnosis not present

## 2016-02-21 DIAGNOSIS — I10 Essential (primary) hypertension: Secondary | ICD-10-CM | POA: Diagnosis not present

## 2016-02-21 DIAGNOSIS — B9789 Other viral agents as the cause of diseases classified elsewhere: Secondary | ICD-10-CM

## 2016-02-21 NOTE — ED Provider Notes (Signed)
[facility] Provider Note   CSN: [id number] Arrival date & time: 02/21/16  1112     History   Chief Complaint Chief Complaint  Patient presents with  . Cough    HPI Virginia Davidson is a 82 y.o. female.  HPI  Pt with hx of dementia presenting with her caregiver and daughter due to onset of nasal congestion and cough beginning last night.  No fever/chills.  No difficulty breathing.  She has continued to eat and drink well.  No change in mental status.  Daughter states they tried allegra, mucinex and humidifier and patient continued to cough this morning.  Daughter wants to make sure that she does not have a pneumonia.  There are no other associated systemic symptoms, there are no other alleviating or modifying factors.   Past Medical History:  Diagnosis Date  . Abdominal aneurysm ([place])   . Dementia   . Diverticulitis   . Glaucoma   . Hypertension   . Thyroid disease   . Urinary incontinence     Patient Active Problem List   Diagnosis Date Noted  . Cough 06/04/2015  . Urinary retention 05/25/2015  . Influenza A (H1N1) 05/24/2015  . Sepsis ([place]) 05/23/2015  . Acute respiratory failure ([place]) 05/23/2015  . HCAP (healthcare-associated pneumonia) 05/23/2015  . Intertrochanteric fracture of right hip S/P ORIF and IM screw 11/29/2013  . Essential hypertension, benign 11/29/2013  . Hypothyroidism 11/29/2013  . GERD (gastroesophageal reflux disease) 11/29/2013  . Dementia 11/29/2013  . Anxiety 11/29/2013  . Acute blood loss anemia 11/29/2013  . UTI (urinary tract infection) 11/29/2013  . Unspecified constipation 11/29/2013    Past Surgical History:  Procedure Laterality Date  . BLADDER SURGERY N/A    x2  . HIP SURGERY Right     OB History    No data available       Home Medications    Prior to Admission medications   Medication Sig Start Date End Date Taking? Authorizing Provider  ALPRAZolam Duanne Moron) 0.25 MG tablet TAKE ONE TABLET 3 TIMES A DAY AS  NEEDED. 02/08/16  Yes Yvonne R Lowne Chase, DO  Calcium Carbonate-Vitamin D 600-200 MG-UNIT CAPS Take 1 tablet by mouth 2 (two) times daily.   Yes Historical Provider, MD  levothyroxine (SYNTHROID, LEVOTHROID) 75 MCG tablet Take 1 tablet (75 mcg total) by mouth daily. 02/14/16  Yes Yvonne R Lowne Chase, DO  lisinopril (PRINIVIL,ZESTRIL) 10 MG tablet TAKE ONE (1) TABLET BY MOUTH EVERY DAY 02/14/16  Yes Yvonne R Lowne Chase, DO  memantine (NAMENDA XR) 7 MG CP24 24 hr capsule Take 1 capsule (7 mg total) by mouth daily. 02/14/16  Yes Yvonne R Lowne Chase, DO  nitroGLYCERIN (NITROSTAT) 0.4 MG SL tablet Place 0.4 mg under the tongue every 5 (five) minutes as needed for chest pain. Reported on 06/01/2015   Yes Historical Provider, MD  NON FORMULARY DNR comfort measures only   Yes Historical Provider, MD  ranitidine (ZANTAC) 150 MG tablet Take 1 tablet (150 mg total) by mouth 2 (two) times daily. Patient taking differently: Take 150 mg by mouth at bedtime.  04/13/15  Yes Yvonne R Lowne Chase, DO  senna-docusate (SENOKOT-S) 8.6-50 MG tablet Take 1 tablet by mouth daily as needed for mild constipation.   Yes Historical Provider, MD  ciprofloxacin (CIPRO) 250 MG tablet Take 1 tablet (250 mg total) by mouth 2 (two) times daily. Patient not taking: Reported on 02/08/2016 01/15/16   Debbrah Alar, NP  nitrofurantoin, macrocrystal-monohydrate, (MACROBID) 100 MG  capsule Take 1 capsule (100 mg total) by mouth 2 (two) times daily. 02/08/16   Ann Held, DO    Family History Family History  Problem Relation Age of Onset  . Stroke Mother     Social History Social History  Substance Use Topics  . Smoking status: Never Smoker  . Smokeless tobacco: Never Used  . Alcohol use No     Allergies   Penicillins; Sulfamethoxazole; Alendronate sodium; and Tetracyclines & related   Review of Systems Review of Systems  ROS reviewed and all otherwise negative except for mentioned in HPI   Physical  Exam Updated Vital Signs BP 157/73 (BP Location: Right Arm)   Pulse 78   Temp 98.3 F (36.8 C) (Oral)   Resp 16   Ht 5\' 4"  (1.626 m)   Wt 95 lb 6.4 oz (43.3 kg)   SpO2 98%   BMI 16.38 kg/m  Vitals reviewed Physical Exam Physical Examination: General appearance - alert, well appearing, and in no distress Mental status - alert, oriented to person Eyes - no conjunctival injection, no scleral icterus Mouth - mucous membranes moist, pharynx normal without lesions Neck - supple, no significant adenopathy Chest - clear to auscultation, no wheezes, rales or rhonchi, symmetric air entry, normal respiratory effort Heart - normal rate, regular rhythm, normal S1, S2, no murmurs, rubs, clicks or gallops Abdomen - soft, nontender, nondistended, no masses or organomegaly Neurological - alert, oriented x 1, moving all extremities, at baseline per family Extremities - peripheral pulses normal, no pedal edema, no clubbing or cyanosis Skin - normal coloration and turgor, no rashes  ED Treatments / Results  Labs (all labs ordered are listed, but only abnormal results are displayed) Labs Reviewed - No data to display  EKG  EKG Interpretation None       Radiology Dg Chest 2 View  Result Date: 02/21/2016 CLINICAL DATA:  Cough and congestion for 2 days. EXAM: CHEST  2 VIEW COMPARISON:  Chest radiograph 06/04/2015. FINDINGS: Stable cardiac and mediastinal contours with tortuosity and calcification of the thoracic aorta. Large hiatal hernia. No large area of pulmonary consolidation. No pleural effusion or pneumothorax. Thoracic spine degenerative changes. Persistent lower thoracic spine vertebral compression deformity. IMPRESSION: No acute cardiopulmonary process. Large gas-filled hiatal hernia. Aortic atherosclerosis. Electronically Signed   By: Lovey Newcomer M.D.   On: 02/21/2016 13:09    Procedures Procedures (including critical care time)  Medications Ordered in ED Medications - No data to  display   Initial Impression / Assessment and Plan / ED Course  I have reviewed the triage vital signs and the nursing notes.  Pertinent labs & imaging results that were available during my care of the patient were reviewed by me and considered in my medical decision making (see chart for details).  Clinical Course     Pt presenting with URI symptoms and nasal congestion with cough. CXR is reassuring.  Vitals are stable.  Pt is at her mental baseline per family.  She has normal respiratory effort.  Family is giving allegra, mucinex.  Advised to encourage hydration and monitor breathing.  Discharged with strict return precautions.  Pt agreeable with plan.  Final Clinical Impressions(s) / ED Diagnoses   Final diagnoses:  Viral URI with cough    New Prescriptions Discharge Medication List as of 02/21/2016  1:17 PM       Alfonzo Beers, MD 02/21/16 1343

## 2016-02-21 NOTE — Discharge Instructions (Signed)
Return to the ED with any concerns including difficulty breathing, vomiting and not able to keep down liquids, decreased urine output, decreased level of alertness/lethargy, or any other alarming symptoms  °

## 2016-02-21 NOTE — ED Triage Notes (Signed)
Patient has dementia.  Daughter in law states yesterday the patient developed a dry cough and sinus congestion.

## 2016-02-27 ENCOUNTER — Emergency Department (INDEPENDENT_AMBULATORY_CARE_PROVIDER_SITE_OTHER)
Admission: EM | Admit: 2016-02-27 | Discharge: 2016-02-27 | Disposition: A | Discharge status: Home / Self Care | Payer: Medicare Other | Source: Home / Self Care | Attending: Family Medicine | Admitting: Family Medicine

## 2016-02-27 ENCOUNTER — Encounter: Payer: Self-pay | Admitting: Emergency Medicine

## 2016-02-27 DIAGNOSIS — J209 Acute bronchitis, unspecified: Secondary | ICD-10-CM | POA: Diagnosis not present

## 2016-02-27 DIAGNOSIS — J029 Acute pharyngitis, unspecified: Secondary | ICD-10-CM

## 2016-02-27 MED ORDER — AZITHROMYCIN 250 MG PO TABS: 250.0000 mg | ORAL_TABLET | Freq: Every day | ORAL | 0 refills | Status: DC

## 2016-02-27 NOTE — ED Triage Notes (Signed)
Patient was seen in ER 02/21/16 for cough and congestion; chest xray negative for pneumonia; no antibiotics. Condition seems to have worsened.

## 2016-02-27 NOTE — ED Provider Notes (Signed)
CSN: [id number]     Arrival date & time 02/27/16  0944 History   First MD Initiated Contact with Patient 02/27/16 1018     Chief Complaint  Patient presents with  . Cough  . Nasal Congestion   (Consider location/radiation/quality/duration/timing/severity/associated sxs/prior Treatment) HPI  Virginia Davidson is a 82 y.o. female with hx of dementia brought to [facility] by her caregiver and son with reports of gradually worsening productive cough and reports of sore throat.  Pt was seen at [facility] on 11/26 for similar symptoms that has started earlier in the day.  She did have a CXR, which was negative. Pt was dx with a viral illness, no antibiotics prescribed. Caregiver and son concerned symptoms have continued to worsen.  Besides cough, congestion, and sore throat, pt is at baseline.  She has been eating and drinking well. No vomiting or diarrhea. No c/o chest pain or SOB.    Past Medical History:  Diagnosis Date  . Abdominal aneurysm ([place])   . Dementia   . Diverticulitis   . Glaucoma   . Hypertension   . Thyroid disease   . Urinary incontinence    Past Surgical History:  Procedure Laterality Date  . BLADDER SURGERY N/A    x2  . HIP SURGERY Right    Family History  Problem Relation Age of Onset  . Stroke Mother    Social History  Substance Use Topics  . Smoking status: Never Smoker  . Smokeless tobacco: Never Used  . Alcohol use No   OB History    No data available     Review of Systems  Constitutional: Negative for chills and fever.  HENT: Positive for congestion, rhinorrhea and sore throat. Negative for ear pain, trouble swallowing and voice change.   Respiratory: Positive for cough. Negative for shortness of breath.   Cardiovascular: Negative for chest pain and palpitations.  Gastrointestinal: Negative for abdominal pain, diarrhea, nausea and vomiting.  Musculoskeletal: Negative for arthralgias, back pain and myalgias.  Skin: Negative for rash.    Allergies   Penicillins; Sulfamethoxazole; Alendronate sodium; and Tetracyclines & related  Home Medications   Prior to Admission medications   Medication Sig Start Date End Date Taking? Authorizing Provider  ALPRAZolam Duanne Moron) 0.25 MG tablet TAKE ONE TABLET 3 TIMES A DAY AS NEEDED. 02/08/16   Rosalita Chessman Chase, DO  azithromycin (ZITHROMAX) 250 MG tablet Take 1 tablet (250 mg total) by mouth daily. Take first 2 tablets together, then 1 every day until finished. 02/27/16   Noland Fordyce, PA-C  Calcium Carbonate-Vitamin D 600-200 MG-UNIT CAPS Take 1 tablet by mouth 2 (two) times daily.    Historical Provider, MD  ciprofloxacin (CIPRO) 250 MG tablet Take 1 tablet (250 mg total) by mouth 2 (two) times daily. Patient not taking: Reported on 02/08/2016 01/15/16   Debbrah Alar, NP  levothyroxine (SYNTHROID, LEVOTHROID) 75 MCG tablet Take 1 tablet (75 mcg total) by mouth daily. 02/14/16   Alferd Apa Lowne Chase, DO  lisinopril (PRINIVIL,ZESTRIL) 10 MG tablet TAKE ONE (1) TABLET BY MOUTH EVERY DAY 02/14/16   Alferd Apa Lowne Chase, DO  memantine (NAMENDA XR) 7 MG CP24 24 hr capsule Take 1 capsule (7 mg total) by mouth daily. 02/14/16   Rosalita Chessman Chase, DO  nitrofurantoin, macrocrystal-monohydrate, (MACROBID) 100 MG capsule Take 1 capsule (100 mg total) by mouth 2 (two) times daily. 02/08/16   Rosalita Chessman Chase, DO  nitroGLYCERIN (NITROSTAT) 0.4 MG SL tablet Place 0.4 mg under  the tongue every 5 (five) minutes as needed for chest pain. Reported on 06/01/2015    Historical Provider, MD  NON FORMULARY DNR comfort measures only    Historical Provider, MD  ranitidine (ZANTAC) 150 MG tablet Take 1 tablet (150 mg total) by mouth 2 (two) times daily. Patient taking differently: Take 150 mg by mouth at bedtime.  04/13/15   Alferd Apa Lowne Chase, DO  senna-docusate (SENOKOT-S) 8.6-50 MG tablet Take 1 tablet by mouth daily as needed for mild constipation.    Historical Provider, MD   Meds Ordered and Administered  this Visit  Medications - No data to display  BP 133/82 (BP Location: Left Arm)   Pulse 68   Temp 97.6 F (36.4 C) (Oral)   Resp 16   Ht 5\' 5"  (1.651 m)   Wt 95 lb (43.1 kg)   SpO2 96%   BMI 15.81 kg/m  No data found.   Physical Exam  Constitutional: She appears well-developed and well-nourished. No distress.  HENT:  Head: Normocephalic and atraumatic.  Right Ear: Tympanic membrane normal.  Left Ear: Tympanic membrane normal.  Nose: Nose normal.  Mouth/Throat: Uvula is midline, oropharynx is clear and moist and mucous membranes are normal.  Eyes: Conjunctivae are normal. No scleral icterus.  Neck: Normal range of motion. Neck supple.  Cardiovascular: Normal rate, regular rhythm and normal heart sounds.   Pulmonary/Chest: Effort normal. No respiratory distress. She has no wheezes. She has rhonchi ( diffuse, clears with cough). She has no rales.  Abdominal: Soft. She exhibits no distension. There is no tenderness.  Musculoskeletal: Normal range of motion.  Neurological: She is alert.  Skin: Skin is warm and dry. She is not diaphoretic.  Nursing note and vitals reviewed.   Urgent Care Course   Clinical Course     Procedures (including critical care time)  Labs Review Labs Reviewed - No data to display  Imaging Review No results found.    MDM   1. Acute bronchitis, unspecified organism   2. Sore throat    Medical records reviewed.  Pt seen 1 week ago for viral illness. Productive cough and rhonchi noted on exam. Lung sounds improved after cough. No respiratory distress. Due to reports of worsening cough, will cover for bacterial cause. Rx: Azithromycin Encouraged f/u with PCP later this week if still not improving.       Noland Fordyce, PA-C 02/27/16 1705

## 2016-03-01 ENCOUNTER — Telehealth: Payer: Self-pay | Admitting: Family Medicine

## 2016-03-01 NOTE — Telephone Encounter (Signed)
Needs ov

## 2016-03-01 NOTE — Telephone Encounter (Signed)
Caller name: Diane Relationship to patient: Daughter-in-law Can be reached: [phone] Pharmacy:  Reason for call: Daughter-in-law request call back to discuss patient's recent BP's. Ask that she receive a call back today.

## 2016-03-01 NOTE — Telephone Encounter (Signed)
Daughter in law Shauna Hugh is concerned about patients blood pressure.  She has been to the ER and an urgent care (notes are in computer).  She gave a few readings of: 180/90, 160/90, 168/90, and 168/72.  She stated that they diagnosed her with pneumonia and she has been a little congested.  They had given her some cough syrup which could have been the cause of the high bp, but the pharmacist has changed to something else.  She did not have the name of the cough med.  Asked is patient was having any chest pains and she stated that on Saturday that her heart was hurting.  Would you like for them to bring patient in or advise on the next step?

## 2016-03-02 ENCOUNTER — Encounter: Payer: Self-pay | Admitting: Family

## 2016-03-02 ENCOUNTER — Ambulatory Visit (HOSPITAL_BASED_OUTPATIENT_CLINIC_OR_DEPARTMENT_OTHER)
Admission: RE | Admit: 2016-03-02 | Discharge: 2016-03-02 | Disposition: A | Discharge status: Home / Self Care | Payer: Medicare Other | Source: Ambulatory Visit | Attending: Family | Admitting: Family

## 2016-03-02 ENCOUNTER — Ambulatory Visit (INDEPENDENT_AMBULATORY_CARE_PROVIDER_SITE_OTHER): Payer: Medicare Other | Admitting: Family

## 2016-03-02 ENCOUNTER — Telehealth: Payer: Self-pay

## 2016-03-02 VITALS — BP 156/66 | HR 63 | Temp 98.0°F | Resp 20 | Ht 64.0 in

## 2016-03-02 DIAGNOSIS — J209 Acute bronchitis, unspecified: Secondary | ICD-10-CM | POA: Diagnosis not present

## 2016-03-02 DIAGNOSIS — R05 Cough: Secondary | ICD-10-CM

## 2016-03-02 DIAGNOSIS — K5900 Constipation, unspecified: Secondary | ICD-10-CM

## 2016-03-02 DIAGNOSIS — R059 Cough, unspecified: Secondary | ICD-10-CM

## 2016-03-02 DIAGNOSIS — I10 Essential (primary) hypertension: Secondary | ICD-10-CM

## 2016-03-02 DIAGNOSIS — F0390 Unspecified dementia without behavioral disturbance: Secondary | ICD-10-CM

## 2016-03-02 NOTE — Telephone Encounter (Signed)
Spoke with pt's daughter in law Diane. Advise Diane pt need an ov due to elevated b/p. I was able to get an acute visit with NP Inda Castle. Diane agree to bring pt in today. She had no questions or concerns at this time. LB

## 2016-03-02 NOTE — Patient Instructions (Addendum)
Please give Ms. Hingle 1 cap full of miralax in 8 oz of water or juice. If no BM within 24 hours please try dulcolax suppository. If still no BM after suppository, please call us.  Complete chest x ray.  Call if new/worsening cough or if symptoms do not improve.

## 2016-03-02 NOTE — Progress Notes (Signed)
Subjective:    Patient ID: Virginia Davidson, female    DOB: [birth year], 82 y.o.   MRN: [id number]  HPI  Virginia Davidson is a 82 yr old female who presents today with several concerns:    1) Elevated blood pressure-   BP Readings from Last 3 Encounters:  03/02/16 (!) 156/66  02/27/16 133/82  02/21/16 157/73   2) Dementia- Family reports that pt used to be under [facility] and they discharged pt 3-4 weeks ago. Would like to have them re-instated.  3) Constipation- Last BM 12/1.  Prune juice fruit, apple sauce.  Honey and lemon.  Drinking.    Of note she was seen in the ED on 11/26 (diagnosed with viral illness) and 02/27/16 (diagnosed with bronchitis) and teated with Azithromycin.   Wt Readings from Last 3 Encounters:  02/27/16 95 lb (43.1 kg)  02/21/16 95 lb 6.4 oz (43.3 kg)  02/08/16 95 lb 6.4 oz (43.3 kg)    Review of Systems    see HPI  Past Medical History:  Diagnosis Date  . Abdominal aneurysm ([place])   . Dementia   . Diverticulitis   . Glaucoma   . Hypertension   . Thyroid disease   . Urinary incontinence      Social History   Social History  . Marital status: Widowed    Spouse name: N/A  . Number of children: N/A  . Years of education: N/A   Occupational History  . Not on file.   Social History Main Topics  . Smoking status: Never Smoker  . Smokeless tobacco: Never Used  . Alcohol use No  . Drug use: No  . Sexual activity: Not Currently   Other Topics Concern  . Not on file   Social History Narrative   Walking -- exercise    Past Surgical History:  Procedure Laterality Date  . BLADDER SURGERY N/A    x2  . HIP SURGERY Right     Family History  Problem Relation Age of Onset  . Stroke Mother     Allergies  Allergen Reactions  . Penicillins Rash    Family is not sure of reaction as far as breathing issues are concerned  . Sulfamethoxazole Rash  . Alendronate Sodium     Unknown Reaction per caregiver  . Tetracyclines & Related    Unknown Reaction per caregiver    Current Outpatient Prescriptions on File Prior to Visit  Medication Sig Dispense Refill  . ALPRAZolam (XANAX) 0.25 MG tablet TAKE ONE TABLET 3 TIMES A DAY AS NEEDED. 30 tablet 1  . Calcium Carbonate-Vitamin D 600-200 MG-UNIT CAPS Take 1 tablet by mouth 2 (two) times daily.    Marland Kitchen levothyroxine (SYNTHROID, LEVOTHROID) 75 MCG tablet Take 1 tablet (75 mcg total) by mouth daily. 30 tablet 2  . lisinopril (PRINIVIL,ZESTRIL) 10 MG tablet TAKE ONE (1) TABLET BY MOUTH EVERY DAY 90 tablet 1  . memantine (NAMENDA XR) 7 MG CP24 24 hr capsule Take 1 capsule (7 mg total) by mouth daily. 30 capsule 2  . nitroGLYCERIN (NITROSTAT) 0.4 MG SL tablet Place 0.4 mg under the tongue every 5 (five) minutes as needed for chest pain. Reported on 06/01/2015    . NON FORMULARY DNR comfort measures only    . ranitidine (ZANTAC) 150 MG tablet Take 1 tablet (150 mg total) by mouth 2 (two) times daily. (Patient taking differently: Take 150 mg by mouth at bedtime. ) 30 tablet 5  . senna-docusate (SENOKOT-S) 8.6-50 MG tablet Take  1 tablet by mouth daily as needed for mild constipation.     No current facility-administered medications on file prior to visit.     BP (!) 156/66 (BP Location: Right Arm, Cuff Size: Normal)   Pulse 63   Temp 98 F (36.7 C) (Oral)   Resp 20   Ht 5\' 4"  (1.626 m)   SpO2 100% Comment: room air   Objective:   Physical Exam  Constitutional: She appears well-developed and well-nourished.  HENT:  Head: Normocephalic and atraumatic.  Right Ear: Tympanic membrane and ear canal normal.  Left Ear: Tympanic membrane and ear canal normal.  Mouth/Throat: No oropharyngeal exudate, posterior oropharyngeal edema or posterior oropharyngeal erythema.  Cardiovascular: Normal rate, regular rhythm and normal heart sounds.   No murmur heard. Pulmonary/Chest: Effort normal and breath sounds normal. No respiratory distress. She has no wheezes.  Musculoskeletal: She exhibits no  edema.  Neurological:  Sleeping in wheelchair but easily arousable, pleasantly confused.   Psychiatric: She has a normal mood and affect. Her behavior is normal. Judgment and thought content normal.          Assessment & Plan:  Acute bronchitis- resolving.  CXR is negative for pneumonia.  I don't think she is protecting her airway that well. She has a tough time clearing the mucous in her chest and sometimes gets choked up on food per family.  Discussed with daughter-in-law and she states the family understands her aspiration pneumonia risk and is not interested in swallow studies or changing her diet in any way.    Dementia- she is eating and drinking and has not had any change in her functional status since she was discharged from hospice.  I advised daughter-in-law and caregiver that she will not likely qualify for hospice again until she has a change in status.    HTN- Her BP looks fine today in the office. Continue current medications.    Constipation- advised the following:  Please give Virginia Davidson 1 cap full of miralax in 8 oz of water or juice. If no BM within 24 hours please try dulcolax suppository. If still no BM after suppository, please call us.

## 2016-03-02 NOTE — Progress Notes (Signed)
Pre visit review using our clinic review tool, if applicable. No additional management support is needed unless otherwise documented below in the visit note. 

## 2016-03-04 ENCOUNTER — Telehealth: Payer: Self-pay

## 2016-03-04 DIAGNOSIS — B3749 Other urogenital candidiasis: Secondary | ICD-10-CM

## 2016-03-04 MED ORDER — NITROFURANTOIN MONOHYD MACRO 100 MG PO CAPS: 100.0000 mg | ORAL_CAPSULE | Freq: Two times a day (BID) | ORAL | 0 refills | Status: DC

## 2016-03-04 NOTE — Telephone Encounter (Signed)
-----   Message from Ann Held, [place] sent at 03/03/2016  5:16 PM EST ----- + uti-- macrobid 1 po bid x 7 days

## 2016-03-04 NOTE — Telephone Encounter (Signed)
Spoke with pt's son,  pt's son states he understand results, and new medication instruction. Rx sent, pt's son notified. Pt's son had no questions or concerns. LB

## 2016-03-11 ENCOUNTER — Other Ambulatory Visit: Payer: Self-pay

## 2016-03-11 DIAGNOSIS — G309 Alzheimer's disease, unspecified: Principal | ICD-10-CM

## 2016-03-11 DIAGNOSIS — F0280 Dementia in other diseases classified elsewhere without behavioral disturbance: Secondary | ICD-10-CM

## 2016-03-11 MED ORDER — MEMANTINE HCL ER 7 MG PO CP24: 7.0000 mg | ORAL_CAPSULE | Freq: Every day | ORAL | 2 refills | Status: DC

## 2016-03-14 ENCOUNTER — Telehealth: Payer: Self-pay | Admitting: Family Medicine

## 2016-03-14 MED ORDER — LEVOTHYROXINE SODIUM 75 MCG PO TABS: 75.0000 ug | ORAL_TABLET | Freq: Every day | ORAL | 2 refills | Status: DC

## 2016-03-14 NOTE — Telephone Encounter (Signed)
Rx sent to pharmacy, per pt's request. LB

## 2016-03-14 NOTE — Telephone Encounter (Signed)
Patient is requesting a refill levothyroxine (SYNTHROID, LEVOTHROID) 75 MCG tablet  Please advise   Pharmacy:  [place], [place] - [address] DE[address] #:

## 2016-03-16 MED ORDER — LISINOPRIL 10 MG PO TABS: ORAL_TABLET | ORAL | 1 refills | Status: DC

## 2016-03-16 MED ORDER — LEVOTHYROXINE SODIUM 75 MCG PO TABS: 75.0000 ug | ORAL_TABLET | Freq: Every day | ORAL | 1 refills | Status: DC

## 2016-03-16 NOTE — Telephone Encounter (Signed)
Rx sent to the pharmacy.

## 2016-03-16 NOTE — Telephone Encounter (Signed)
Daughter is very upset stating [facility] has sent several request for the following refills lisinopril (PRINIVIL,ZESTRIL) 10 MG tablet and levothyroxine (SYNTHROID, LEVOTHROID) 75 MCG tablet, chart doesn't reflect, daughter states mother is completely out and would like medication filled today, please advise  [place], [place] - [address] [phone] (Phone) [phone] (Fax)

## 2016-03-16 NOTE — Telephone Encounter (Signed)
[facility] to verify that they received Rx's.  Pharm Tech verified Rx's were received.  Called patient's son who then ask that I call his wife, Virginia Davidson, at [phone] and make her aware.  Called wife and made her aware.  No further needs at this time.

## 2016-03-17 ENCOUNTER — Telehealth: Payer: Self-pay

## 2016-03-17 NOTE — Telephone Encounter (Signed)
Received Hospice Discharge notification. Signed by Dr. Lorelei Pont in PCP absence and faxed to [phone] and sent for scanning.

## 2016-03-23 ENCOUNTER — Emergency Department (HOSPITAL_BASED_OUTPATIENT_CLINIC_OR_DEPARTMENT_OTHER)
Admission: EM | Admit: 2016-03-23 | Discharge: 2016-03-23 | Disposition: A | Discharge status: Home / Self Care | Payer: Medicare Other | Attending: Emergency Medicine | Admitting: Emergency Medicine

## 2016-03-23 ENCOUNTER — Encounter (HOSPITAL_BASED_OUTPATIENT_CLINIC_OR_DEPARTMENT_OTHER): Payer: Self-pay | Admitting: *Deleted

## 2016-03-23 ENCOUNTER — Emergency Department (HOSPITAL_BASED_OUTPATIENT_CLINIC_OR_DEPARTMENT_OTHER): Payer: Medicare Other

## 2016-03-23 DIAGNOSIS — Z79899 Other long term (current) drug therapy: Secondary | ICD-10-CM | POA: Diagnosis not present

## 2016-03-23 DIAGNOSIS — R0602 Shortness of breath: Secondary | ICD-10-CM | POA: Diagnosis not present

## 2016-03-23 DIAGNOSIS — R0989 Other specified symptoms and signs involving the circulatory and respiratory systems: Secondary | ICD-10-CM

## 2016-03-23 DIAGNOSIS — E039 Hypothyroidism, unspecified: Secondary | ICD-10-CM | POA: Insufficient documentation

## 2016-03-23 DIAGNOSIS — R05 Cough: Secondary | ICD-10-CM | POA: Diagnosis not present

## 2016-03-23 DIAGNOSIS — I10 Essential (primary) hypertension: Secondary | ICD-10-CM | POA: Diagnosis not present

## 2016-03-23 DIAGNOSIS — J069 Acute upper respiratory infection, unspecified: Secondary | ICD-10-CM | POA: Diagnosis not present

## 2016-03-23 DIAGNOSIS — B9789 Other viral agents as the cause of diseases classified elsewhere: Secondary | ICD-10-CM

## 2016-03-23 DIAGNOSIS — R54 Age-related physical debility: Secondary | ICD-10-CM

## 2016-03-23 DIAGNOSIS — R4181 Age-related cognitive decline: Secondary | ICD-10-CM | POA: Diagnosis not present

## 2016-03-23 MED ORDER — CEPHALEXIN 500 MG PO CAPS: 500.0000 mg | ORAL_CAPSULE | Freq: Three times a day (TID) | ORAL | 0 refills | Status: AC

## 2016-03-23 NOTE — ED Notes (Signed)
ED Provider at bedside. 

## 2016-03-23 NOTE — ED Notes (Signed)
Family given d/c instructions as per chart. Rx x 1. Verbalizes understanding. No questions. 

## 2016-03-23 NOTE — ED Notes (Signed)
Patient's family reports the patient was previously under the care of hospice due to a rapid weight loss and decline in PO intake. Patient was d/c for hospice because she was "doing well". Patient lives in independent living with 24/7 sitters and weekly nurse visits. Patient has started chocking when eating and also eating less than usual. Patient is alert, with dementia, oriented at her baseline. Patient family is requesting patient to be readmitted to hospice care and remain in her current facility due to her declining health.

## 2016-03-23 NOTE — ED Triage Notes (Signed)
Daughter states home health nurse statyes she is aspirating when she eats, increased cough and decreased mental status.

## 2016-03-23 NOTE — ED Provider Notes (Signed)
[place] DEPT [facility] Provider Note   CSN: [id number] Arrival date & time: 03/23/16  1528     History   Chief Complaint Chief Complaint  Patient presents with  . Shortness of Breath    HPI Virginia Davidson is a 82 y.o. female.  The history is provided by a relative.  Cough  This is a recurrent problem. Episode onset: 4 weeks ago. The problem occurs constantly. The problem has not changed since onset.Cough characteristics: wet but unable to produce sputum. There has been no fever. Associated symptoms include shortness of breath (reaching for her neck and had a brief episode of shortness of breath, occasionally choking on food). She has tried nothing for the symptoms.    Past Medical History:  Diagnosis Date  . Abdominal aneurysm ([place])   . Dementia   . Diverticulitis   . Glaucoma   . Hypertension   . Thyroid disease   . Urinary incontinence     Patient Active Problem List   Diagnosis Date Noted  . Cough 06/04/2015  . Urinary retention 05/25/2015  . Influenza A (H1N1) 05/24/2015  . Sepsis ([place]) 05/23/2015  . Acute respiratory failure ([place]) 05/23/2015  . HCAP (healthcare-associated pneumonia) 05/23/2015  . Intertrochanteric fracture of right hip S/P ORIF and IM screw 11/29/2013  . Essential hypertension, benign 11/29/2013  . Hypothyroidism 11/29/2013  . GERD (gastroesophageal reflux disease) 11/29/2013  . Dementia 11/29/2013  . Anxiety 11/29/2013  . Acute blood loss anemia 11/29/2013  . UTI (urinary tract infection) 11/29/2013  . Unspecified constipation 11/29/2013    Past Surgical History:  Procedure Laterality Date  . BLADDER SURGERY N/A    x2  . HIP SURGERY Right     OB History    No data available       Home Medications    Prior to Admission medications   Medication Sig Start Date End Date Taking? Authorizing Provider  ALPRAZolam Duanne Moron) 0.25 MG tablet TAKE ONE TABLET 3 TIMES A DAY AS NEEDED. 02/08/16   Rosalita Chessman Chase, DO  Calcium  Carbonate-Vitamin D 600-200 MG-UNIT CAPS Take 1 tablet by mouth 2 (two) times daily.    Historical Provider, MD  levothyroxine (SYNTHROID, LEVOTHROID) 75 MCG tablet Take 1 tablet (75 mcg total) by mouth daily. 03/16/16   Rosalita Chessman Chase, DO  lisinopril (PRINIVIL,ZESTRIL) 10 MG tablet TAKE ONE (1) TABLET BY MOUTH EVERY DAY 03/16/16   Alferd Apa Lowne Chase, DO  memantine (NAMENDA XR) 7 MG CP24 24 hr capsule Take 1 capsule (7 mg total) by mouth daily. 03/11/16   Rosalita Chessman Chase, DO  nitrofurantoin, macrocrystal-monohydrate, (MACROBID) 100 MG capsule Take 1 capsule (100 mg total) by mouth 2 (two) times daily. 03/04/16   Rosalita Chessman Chase, DO  nitroGLYCERIN (NITROSTAT) 0.4 MG SL tablet Place 0.4 mg under the tongue every 5 (five) minutes as needed for chest pain. Reported on 06/01/2015    Historical Provider, MD  NON FORMULARY DNR comfort measures only    Historical Provider, MD  ranitidine (ZANTAC) 150 MG tablet Take 1 tablet (150 mg total) by mouth 2 (two) times daily. Patient taking differently: Take 150 mg by mouth at bedtime.  04/13/15   Alferd Apa Lowne Chase, DO  senna-docusate (SENOKOT-S) 8.6-50 MG tablet Take 1 tablet by mouth daily as needed for mild constipation.    Historical Provider, MD    Family History Family History  Problem Relation Age of Onset  . Stroke Mother     Social History Social  History  Substance Use Topics  . Smoking status: Never Smoker  . Smokeless tobacco: Never Used  . Alcohol use No     Allergies   Penicillins; Sulfamethoxazole; Alendronate sodium; and Tetracyclines & related   Review of Systems Review of Systems  Respiratory: Positive for cough and shortness of breath (reaching for her neck and had a brief episode of shortness of breath, occasionally choking on food).   All other systems reviewed and are negative.    Physical Exam Updated Vital Signs BP 147/73   Pulse 65   Temp 98 F (36.7 C) (Oral)   Resp 20   Ht 5\' 4"  (1.626 m)   Wt  95 lb (43.1 kg)   SpO2 94%   BMI 16.31 kg/m   Physical Exam  Constitutional: She appears well-developed and well-nourished. She appears lethargic. No distress.  HENT:  Head: Normocephalic.  Nose: Nose normal.  Eyes: Conjunctivae are normal.  Neck: Neck supple. No tracheal deviation present.  Cardiovascular: Normal rate, regular rhythm and normal heart sounds.   Pulmonary/Chest: Effort normal. No accessory muscle usage. No tachypnea. No respiratory distress. She has no wheezes. She has rhonchi (coarse bilateral with wet cough).  Abdominal: Soft. She exhibits no distension.  Neurological: She appears lethargic.  Skin: Skin is warm and dry.  Psychiatric: Her affect is blunt. Her speech is delayed. She is slowed and withdrawn.     ED Treatments / Results  Labs (all labs ordered are listed, but only abnormal results are displayed) Labs Reviewed - No data to display  EKG  EKG Interpretation None       Radiology Dg Chest 2 View  Result Date: 03/23/2016 CLINICAL DATA:  Patient's daughter in law states that patient keeps aspirating and her cough has increased, has had a cough and congestion since 02/20/16, hx of dementia, HTN, thyroid disease, no other complaints EXAM: CHEST  2 VIEW COMPARISON:  03/02/2016 FINDINGS: Cardiac silhouette is mildly enlarged. There is a moderate size hiatal hernia, stable. No mediastinal or hilar masses. No convincing adenopathy. Clear lungs.  No pleural effusion.  No pneumothorax. Chronic wedge-shaped compression deformity of a lower thoracic vertebra. Bony thorax is diffusely demineralized. IMPRESSION: 1. No acute cardiopulmonary disease. Electronically Signed   By: Lajean Manes M.D.   On: 03/23/2016 15:54    Procedures Procedures (including critical care time)  Medications Ordered in ED Medications - No data to display   Initial Impression / Assessment and Plan / ED Course  I have reviewed the triage vital signs and the nursing  notes.  Pertinent labs & imaging results that were available during my care of the patient were reviewed by me and considered in my medical decision making (see chart for details).  Clinical Course     82 y.o. female presents with ongoing cough and reaching for her throat earlier as if she was unable to breathe. She is saturating well on room air here. No consolidated pneumonia on CXR. She was previously under the care of hospice but had rescinded d/t clinical improvement. Discussed workup as I suspect the Pt has an ongoing viral URI but is weak and unable to clear secretions and was experiencing a momentary air hunger episode. Her family is in agreement that they would like to focus on comfort measures so no labs will be drawn and no admission to hospital. Discussed with case management who helped family arrange for patient to continue hospice care. Her future clinical course is uncertain.   Upon leaving the  patient's family was concerned her urine has been clouding. Due to discomfort of catheterization and recurrent UTI history we will treat empirically with keflex as risk of treatment is low and may help with her level of comfort.  Final Clinical Impressions(s) / ED Diagnoses   Final diagnoses:  Viral URI with cough  Air hunger  Frail elderly    New Prescriptions Discharge Medication List as of 03/23/2016  6:34 PM       Leo Grosser, MD 03/24/16 0130

## 2016-03-23 NOTE — Progress Notes (Addendum)
[facility] received consult from [facility] requesting restart of hospice services.  EDP reports hospice services stopped by family as patient seemed to be getting better.  EDP spoke to patient's family while at bedside with patient.  Patient is from an [place].  Family reports patient has someone staying with her.  Family at bedside reports patient's hospice agency is in [facility]  [facility] placed call into the [facility].  Awaiting call back for RN on call.  1815pm EDCM spoke to [place].  She instructed EDCM to fax referral to [phone] so that it may be reviewed and family will be contacted tomorrow.  Also a possibility that patient will be assessed tomorrow.  [facility] discussed patient with EDP.  EDCM spoke to patient's daughter in law Diane at bedside who reports she will call the [facility] tomorrow as well.  She is requesting nurse that took care of the patient last.  Diane thankful for services.  No further EDCM needs at this time.  [facility] faxed hospice referral to [facility] [place] with confirmation of receipt.

## 2016-03-24 ENCOUNTER — Telehealth: Payer: Self-pay | Admitting: Family Medicine

## 2016-03-24 NOTE — Telephone Encounter (Signed)
Caller name: Manus Gunning  Relation to pt: referral specialist from [facility]  Call back number: [phone]    Reason for call:  Inquiring if PCP will be atteninding provider

## 2016-03-25 DIAGNOSIS — I10 Essential (primary) hypertension: Secondary | ICD-10-CM | POA: Diagnosis not present

## 2016-03-25 DIAGNOSIS — G301 Alzheimer's disease with late onset: Secondary | ICD-10-CM | POA: Diagnosis not present

## 2016-03-25 DIAGNOSIS — R634 Abnormal weight loss: Secondary | ICD-10-CM | POA: Diagnosis not present

## 2016-03-25 DIAGNOSIS — R1310 Dysphagia, unspecified: Secondary | ICD-10-CM | POA: Diagnosis not present

## 2016-03-25 DIAGNOSIS — Z681 Body mass index (BMI) 19 or less, adult: Secondary | ICD-10-CM | POA: Diagnosis not present

## 2016-03-25 DIAGNOSIS — F0280 Dementia in other diseases classified elsewhere without behavioral disturbance: Secondary | ICD-10-CM | POA: Diagnosis not present

## 2016-03-25 NOTE — Telephone Encounter (Signed)
Manus Gunning from Hospice called to follow up to see if PCP will be attending provider for Hospice. States she has a 10:00 appointment with the patient and family this morning and need to know prior to the visit.

## 2016-03-25 NOTE — Telephone Encounter (Signed)
Called Virginia Davidson.  Virginia Davidson stated they enrolled patient using one of their providers with Dr. Etter Sjogren following.

## 2016-03-25 NOTE — Telephone Encounter (Signed)
I spoke to Virginia Davidson this am--- I'm happy to do it if that is what the family would like but I know its easier for hospice dr to take of things

## 2016-03-25 NOTE — Telephone Encounter (Signed)
Dr. Etter Sjogren, please advise.

## 2016-03-27 DIAGNOSIS — Z681 Body mass index (BMI) 19 or less, adult: Secondary | ICD-10-CM | POA: Diagnosis not present

## 2016-03-27 DIAGNOSIS — R634 Abnormal weight loss: Secondary | ICD-10-CM | POA: Diagnosis not present

## 2016-03-27 DIAGNOSIS — G301 Alzheimer's disease with late onset: Secondary | ICD-10-CM | POA: Diagnosis not present

## 2016-03-27 DIAGNOSIS — R1310 Dysphagia, unspecified: Secondary | ICD-10-CM | POA: Diagnosis not present

## 2016-03-27 DIAGNOSIS — I10 Essential (primary) hypertension: Secondary | ICD-10-CM | POA: Diagnosis not present

## 2016-03-27 DIAGNOSIS — F0280 Dementia in other diseases classified elsewhere without behavioral disturbance: Secondary | ICD-10-CM | POA: Diagnosis not present

## 2016-03-28 DIAGNOSIS — R634 Abnormal weight loss: Secondary | ICD-10-CM | POA: Diagnosis not present

## 2016-03-28 DIAGNOSIS — I10 Essential (primary) hypertension: Secondary | ICD-10-CM | POA: Diagnosis not present

## 2016-03-28 DIAGNOSIS — Z681 Body mass index (BMI) 19 or less, adult: Secondary | ICD-10-CM | POA: Diagnosis not present

## 2016-03-28 DIAGNOSIS — F0280 Dementia in other diseases classified elsewhere without behavioral disturbance: Secondary | ICD-10-CM | POA: Diagnosis not present

## 2016-03-28 DIAGNOSIS — G301 Alzheimer's disease with late onset: Secondary | ICD-10-CM | POA: Diagnosis not present

## 2016-03-28 DIAGNOSIS — R1310 Dysphagia, unspecified: Secondary | ICD-10-CM | POA: Diagnosis not present

## 2016-03-30 DIAGNOSIS — R634 Abnormal weight loss: Secondary | ICD-10-CM | POA: Diagnosis not present

## 2016-03-30 DIAGNOSIS — Z681 Body mass index (BMI) 19 or less, adult: Secondary | ICD-10-CM | POA: Diagnosis not present

## 2016-03-30 DIAGNOSIS — R1310 Dysphagia, unspecified: Secondary | ICD-10-CM | POA: Diagnosis not present

## 2016-03-30 DIAGNOSIS — G301 Alzheimer's disease with late onset: Secondary | ICD-10-CM | POA: Diagnosis not present

## 2016-03-30 DIAGNOSIS — I10 Essential (primary) hypertension: Secondary | ICD-10-CM | POA: Diagnosis not present

## 2016-03-30 DIAGNOSIS — F0280 Dementia in other diseases classified elsewhere without behavioral disturbance: Secondary | ICD-10-CM | POA: Diagnosis not present

## 2016-04-01 DIAGNOSIS — R1310 Dysphagia, unspecified: Secondary | ICD-10-CM | POA: Diagnosis not present

## 2016-04-01 DIAGNOSIS — Z681 Body mass index (BMI) 19 or less, adult: Secondary | ICD-10-CM | POA: Diagnosis not present

## 2016-04-01 DIAGNOSIS — G301 Alzheimer's disease with late onset: Secondary | ICD-10-CM | POA: Diagnosis not present

## 2016-04-01 DIAGNOSIS — F0280 Dementia in other diseases classified elsewhere without behavioral disturbance: Secondary | ICD-10-CM | POA: Diagnosis not present

## 2016-04-01 DIAGNOSIS — R634 Abnormal weight loss: Secondary | ICD-10-CM | POA: Diagnosis not present

## 2016-04-01 DIAGNOSIS — I10 Essential (primary) hypertension: Secondary | ICD-10-CM | POA: Diagnosis not present

## 2016-04-08 DIAGNOSIS — Z681 Body mass index (BMI) 19 or less, adult: Secondary | ICD-10-CM | POA: Diagnosis not present

## 2016-04-08 DIAGNOSIS — G301 Alzheimer's disease with late onset: Secondary | ICD-10-CM | POA: Diagnosis not present

## 2016-04-08 DIAGNOSIS — F0280 Dementia in other diseases classified elsewhere without behavioral disturbance: Secondary | ICD-10-CM | POA: Diagnosis not present

## 2016-04-08 DIAGNOSIS — R634 Abnormal weight loss: Secondary | ICD-10-CM | POA: Diagnosis not present

## 2016-04-08 DIAGNOSIS — R1310 Dysphagia, unspecified: Secondary | ICD-10-CM | POA: Diagnosis not present

## 2016-04-08 DIAGNOSIS — I10 Essential (primary) hypertension: Secondary | ICD-10-CM | POA: Diagnosis not present

## 2016-04-15 DIAGNOSIS — I10 Essential (primary) hypertension: Secondary | ICD-10-CM | POA: Diagnosis not present

## 2016-04-15 DIAGNOSIS — R1310 Dysphagia, unspecified: Secondary | ICD-10-CM | POA: Diagnosis not present

## 2016-04-15 DIAGNOSIS — Z681 Body mass index (BMI) 19 or less, adult: Secondary | ICD-10-CM | POA: Diagnosis not present

## 2016-04-15 DIAGNOSIS — G301 Alzheimer's disease with late onset: Secondary | ICD-10-CM | POA: Diagnosis not present

## 2016-04-15 DIAGNOSIS — R634 Abnormal weight loss: Secondary | ICD-10-CM | POA: Diagnosis not present

## 2016-04-15 DIAGNOSIS — F0280 Dementia in other diseases classified elsewhere without behavioral disturbance: Secondary | ICD-10-CM | POA: Diagnosis not present

## 2016-04-18 ENCOUNTER — Other Ambulatory Visit: Payer: Self-pay

## 2016-04-18 DIAGNOSIS — G309 Alzheimer's disease, unspecified: Principal | ICD-10-CM

## 2016-04-18 DIAGNOSIS — F0280 Dementia in other diseases classified elsewhere without behavioral disturbance: Secondary | ICD-10-CM

## 2016-04-18 MED ORDER — MEMANTINE HCL ER 7 MG PO CP24: 7.0000 mg | ORAL_CAPSULE | Freq: Every day | ORAL | 2 refills | Status: DC

## 2016-04-22 DIAGNOSIS — R1310 Dysphagia, unspecified: Secondary | ICD-10-CM | POA: Diagnosis not present

## 2016-04-22 DIAGNOSIS — I10 Essential (primary) hypertension: Secondary | ICD-10-CM | POA: Diagnosis not present

## 2016-04-22 DIAGNOSIS — R634 Abnormal weight loss: Secondary | ICD-10-CM | POA: Diagnosis not present

## 2016-04-22 DIAGNOSIS — G301 Alzheimer's disease with late onset: Secondary | ICD-10-CM | POA: Diagnosis not present

## 2016-04-22 DIAGNOSIS — F0280 Dementia in other diseases classified elsewhere without behavioral disturbance: Secondary | ICD-10-CM | POA: Diagnosis not present

## 2016-04-22 DIAGNOSIS — Z681 Body mass index (BMI) 19 or less, adult: Secondary | ICD-10-CM | POA: Diagnosis not present

## 2016-04-28 DIAGNOSIS — Z681 Body mass index (BMI) 19 or less, adult: Secondary | ICD-10-CM | POA: Diagnosis not present

## 2016-04-28 DIAGNOSIS — I10 Essential (primary) hypertension: Secondary | ICD-10-CM | POA: Diagnosis not present

## 2016-04-28 DIAGNOSIS — G301 Alzheimer's disease with late onset: Secondary | ICD-10-CM | POA: Diagnosis not present

## 2016-04-28 DIAGNOSIS — R1310 Dysphagia, unspecified: Secondary | ICD-10-CM | POA: Diagnosis not present

## 2016-04-28 DIAGNOSIS — R634 Abnormal weight loss: Secondary | ICD-10-CM | POA: Diagnosis not present

## 2016-04-28 DIAGNOSIS — F0280 Dementia in other diseases classified elsewhere without behavioral disturbance: Secondary | ICD-10-CM | POA: Diagnosis not present

## 2016-04-29 DIAGNOSIS — G301 Alzheimer's disease with late onset: Secondary | ICD-10-CM | POA: Diagnosis not present

## 2016-04-29 DIAGNOSIS — R1310 Dysphagia, unspecified: Secondary | ICD-10-CM | POA: Diagnosis not present

## 2016-04-29 DIAGNOSIS — R634 Abnormal weight loss: Secondary | ICD-10-CM | POA: Diagnosis not present

## 2016-04-29 DIAGNOSIS — I10 Essential (primary) hypertension: Secondary | ICD-10-CM | POA: Diagnosis not present

## 2016-04-29 DIAGNOSIS — F0280 Dementia in other diseases classified elsewhere without behavioral disturbance: Secondary | ICD-10-CM | POA: Diagnosis not present

## 2016-04-29 DIAGNOSIS — Z681 Body mass index (BMI) 19 or less, adult: Secondary | ICD-10-CM | POA: Diagnosis not present

## 2016-04-30 DIAGNOSIS — Z681 Body mass index (BMI) 19 or less, adult: Secondary | ICD-10-CM | POA: Diagnosis not present

## 2016-04-30 DIAGNOSIS — I10 Essential (primary) hypertension: Secondary | ICD-10-CM | POA: Diagnosis not present

## 2016-04-30 DIAGNOSIS — F0280 Dementia in other diseases classified elsewhere without behavioral disturbance: Secondary | ICD-10-CM | POA: Diagnosis not present

## 2016-04-30 DIAGNOSIS — G301 Alzheimer's disease with late onset: Secondary | ICD-10-CM | POA: Diagnosis not present

## 2016-04-30 DIAGNOSIS — R1310 Dysphagia, unspecified: Secondary | ICD-10-CM | POA: Diagnosis not present

## 2016-04-30 DIAGNOSIS — R634 Abnormal weight loss: Secondary | ICD-10-CM | POA: Diagnosis not present

## 2016-05-05 DIAGNOSIS — F0280 Dementia in other diseases classified elsewhere without behavioral disturbance: Secondary | ICD-10-CM | POA: Diagnosis not present

## 2016-05-05 DIAGNOSIS — I10 Essential (primary) hypertension: Secondary | ICD-10-CM | POA: Diagnosis not present

## 2016-05-05 DIAGNOSIS — R1310 Dysphagia, unspecified: Secondary | ICD-10-CM | POA: Diagnosis not present

## 2016-05-05 DIAGNOSIS — G301 Alzheimer's disease with late onset: Secondary | ICD-10-CM | POA: Diagnosis not present

## 2016-05-05 DIAGNOSIS — R634 Abnormal weight loss: Secondary | ICD-10-CM | POA: Diagnosis not present

## 2016-05-05 DIAGNOSIS — Z681 Body mass index (BMI) 19 or less, adult: Secondary | ICD-10-CM | POA: Diagnosis not present

## 2016-05-10 ENCOUNTER — Ambulatory Visit: Payer: PRIVATE HEALTH INSURANCE | Admitting: Family Medicine

## 2016-05-13 DIAGNOSIS — R1310 Dysphagia, unspecified: Secondary | ICD-10-CM | POA: Diagnosis not present

## 2016-05-13 DIAGNOSIS — Z681 Body mass index (BMI) 19 or less, adult: Secondary | ICD-10-CM | POA: Diagnosis not present

## 2016-05-13 DIAGNOSIS — I10 Essential (primary) hypertension: Secondary | ICD-10-CM | POA: Diagnosis not present

## 2016-05-13 DIAGNOSIS — R634 Abnormal weight loss: Secondary | ICD-10-CM | POA: Diagnosis not present

## 2016-05-13 DIAGNOSIS — F0280 Dementia in other diseases classified elsewhere without behavioral disturbance: Secondary | ICD-10-CM | POA: Diagnosis not present

## 2016-05-13 DIAGNOSIS — G301 Alzheimer's disease with late onset: Secondary | ICD-10-CM | POA: Diagnosis not present

## 2016-05-20 DIAGNOSIS — R1310 Dysphagia, unspecified: Secondary | ICD-10-CM | POA: Diagnosis not present

## 2016-05-20 DIAGNOSIS — F0280 Dementia in other diseases classified elsewhere without behavioral disturbance: Secondary | ICD-10-CM | POA: Diagnosis not present

## 2016-05-20 DIAGNOSIS — G301 Alzheimer's disease with late onset: Secondary | ICD-10-CM | POA: Diagnosis not present

## 2016-05-20 DIAGNOSIS — I10 Essential (primary) hypertension: Secondary | ICD-10-CM | POA: Diagnosis not present

## 2016-05-20 DIAGNOSIS — R634 Abnormal weight loss: Secondary | ICD-10-CM | POA: Diagnosis not present

## 2016-05-20 DIAGNOSIS — Z681 Body mass index (BMI) 19 or less, adult: Secondary | ICD-10-CM | POA: Diagnosis not present

## 2016-05-25 DIAGNOSIS — F0280 Dementia in other diseases classified elsewhere without behavioral disturbance: Secondary | ICD-10-CM | POA: Diagnosis not present

## 2016-05-25 DIAGNOSIS — G301 Alzheimer's disease with late onset: Secondary | ICD-10-CM | POA: Diagnosis not present

## 2016-05-25 DIAGNOSIS — Z681 Body mass index (BMI) 19 or less, adult: Secondary | ICD-10-CM | POA: Diagnosis not present

## 2016-05-25 DIAGNOSIS — R634 Abnormal weight loss: Secondary | ICD-10-CM | POA: Diagnosis not present

## 2016-05-25 DIAGNOSIS — R1310 Dysphagia, unspecified: Secondary | ICD-10-CM | POA: Diagnosis not present

## 2016-05-25 DIAGNOSIS — I10 Essential (primary) hypertension: Secondary | ICD-10-CM | POA: Diagnosis not present

## 2016-05-26 DIAGNOSIS — Z681 Body mass index (BMI) 19 or less, adult: Secondary | ICD-10-CM | POA: Diagnosis not present

## 2016-05-26 DIAGNOSIS — F0280 Dementia in other diseases classified elsewhere without behavioral disturbance: Secondary | ICD-10-CM | POA: Diagnosis not present

## 2016-05-26 DIAGNOSIS — I10 Essential (primary) hypertension: Secondary | ICD-10-CM | POA: Diagnosis not present

## 2016-05-26 DIAGNOSIS — R634 Abnormal weight loss: Secondary | ICD-10-CM | POA: Diagnosis not present

## 2016-05-26 DIAGNOSIS — R1310 Dysphagia, unspecified: Secondary | ICD-10-CM | POA: Diagnosis not present

## 2016-05-26 DIAGNOSIS — G301 Alzheimer's disease with late onset: Secondary | ICD-10-CM | POA: Diagnosis not present

## 2016-05-27 DIAGNOSIS — R634 Abnormal weight loss: Secondary | ICD-10-CM | POA: Diagnosis not present

## 2016-05-27 DIAGNOSIS — R1310 Dysphagia, unspecified: Secondary | ICD-10-CM | POA: Diagnosis not present

## 2016-05-27 DIAGNOSIS — Z681 Body mass index (BMI) 19 or less, adult: Secondary | ICD-10-CM | POA: Diagnosis not present

## 2016-05-27 DIAGNOSIS — I10 Essential (primary) hypertension: Secondary | ICD-10-CM | POA: Diagnosis not present

## 2016-05-27 DIAGNOSIS — F0280 Dementia in other diseases classified elsewhere without behavioral disturbance: Secondary | ICD-10-CM | POA: Diagnosis not present

## 2016-05-27 DIAGNOSIS — G301 Alzheimer's disease with late onset: Secondary | ICD-10-CM | POA: Diagnosis not present

## 2016-06-02 DIAGNOSIS — G301 Alzheimer's disease with late onset: Secondary | ICD-10-CM | POA: Diagnosis not present

## 2016-06-02 DIAGNOSIS — R1310 Dysphagia, unspecified: Secondary | ICD-10-CM | POA: Diagnosis not present

## 2016-06-02 DIAGNOSIS — Z681 Body mass index (BMI) 19 or less, adult: Secondary | ICD-10-CM | POA: Diagnosis not present

## 2016-06-02 DIAGNOSIS — I10 Essential (primary) hypertension: Secondary | ICD-10-CM | POA: Diagnosis not present

## 2016-06-02 DIAGNOSIS — R634 Abnormal weight loss: Secondary | ICD-10-CM | POA: Diagnosis not present

## 2016-06-02 DIAGNOSIS — F0280 Dementia in other diseases classified elsewhere without behavioral disturbance: Secondary | ICD-10-CM | POA: Diagnosis not present

## 2016-06-03 DIAGNOSIS — G301 Alzheimer's disease with late onset: Secondary | ICD-10-CM | POA: Diagnosis not present

## 2016-06-03 DIAGNOSIS — R634 Abnormal weight loss: Secondary | ICD-10-CM | POA: Diagnosis not present

## 2016-06-03 DIAGNOSIS — F0280 Dementia in other diseases classified elsewhere without behavioral disturbance: Secondary | ICD-10-CM | POA: Diagnosis not present

## 2016-06-03 DIAGNOSIS — Z681 Body mass index (BMI) 19 or less, adult: Secondary | ICD-10-CM | POA: Diagnosis not present

## 2016-06-03 DIAGNOSIS — R1310 Dysphagia, unspecified: Secondary | ICD-10-CM | POA: Diagnosis not present

## 2016-06-03 DIAGNOSIS — I10 Essential (primary) hypertension: Secondary | ICD-10-CM | POA: Diagnosis not present

## 2016-06-10 DIAGNOSIS — R1310 Dysphagia, unspecified: Secondary | ICD-10-CM | POA: Diagnosis not present

## 2016-06-10 DIAGNOSIS — R634 Abnormal weight loss: Secondary | ICD-10-CM | POA: Diagnosis not present

## 2016-06-10 DIAGNOSIS — G301 Alzheimer's disease with late onset: Secondary | ICD-10-CM | POA: Diagnosis not present

## 2016-06-10 DIAGNOSIS — I10 Essential (primary) hypertension: Secondary | ICD-10-CM | POA: Diagnosis not present

## 2016-06-10 DIAGNOSIS — F0280 Dementia in other diseases classified elsewhere without behavioral disturbance: Secondary | ICD-10-CM | POA: Diagnosis not present

## 2016-06-10 DIAGNOSIS — Z681 Body mass index (BMI) 19 or less, adult: Secondary | ICD-10-CM | POA: Diagnosis not present

## 2016-06-14 ENCOUNTER — Telehealth: Payer: Self-pay | Admitting: Family Medicine

## 2016-06-14 NOTE — Telephone Encounter (Signed)
Spoke with diane in regards to scheduling awv for [facility]. Diane stated that Cecilia is currently in Hospice and will not need to schedule awv at this time.

## 2016-06-16 DIAGNOSIS — I10 Essential (primary) hypertension: Secondary | ICD-10-CM | POA: Diagnosis not present

## 2016-06-16 DIAGNOSIS — F0280 Dementia in other diseases classified elsewhere without behavioral disturbance: Secondary | ICD-10-CM | POA: Diagnosis not present

## 2016-06-16 DIAGNOSIS — R1310 Dysphagia, unspecified: Secondary | ICD-10-CM | POA: Diagnosis not present

## 2016-06-16 DIAGNOSIS — Z681 Body mass index (BMI) 19 or less, adult: Secondary | ICD-10-CM | POA: Diagnosis not present

## 2016-06-16 DIAGNOSIS — R634 Abnormal weight loss: Secondary | ICD-10-CM | POA: Diagnosis not present

## 2016-06-16 DIAGNOSIS — G301 Alzheimer's disease with late onset: Secondary | ICD-10-CM | POA: Diagnosis not present

## 2016-06-23 DIAGNOSIS — F0280 Dementia in other diseases classified elsewhere without behavioral disturbance: Secondary | ICD-10-CM | POA: Diagnosis not present

## 2016-06-23 DIAGNOSIS — G301 Alzheimer's disease with late onset: Secondary | ICD-10-CM | POA: Diagnosis not present

## 2016-06-23 DIAGNOSIS — Z681 Body mass index (BMI) 19 or less, adult: Secondary | ICD-10-CM | POA: Diagnosis not present

## 2016-06-23 DIAGNOSIS — I10 Essential (primary) hypertension: Secondary | ICD-10-CM | POA: Diagnosis not present

## 2016-06-23 DIAGNOSIS — R634 Abnormal weight loss: Secondary | ICD-10-CM | POA: Diagnosis not present

## 2016-06-23 DIAGNOSIS — R1310 Dysphagia, unspecified: Secondary | ICD-10-CM | POA: Diagnosis not present

## 2016-06-26 DIAGNOSIS — R1310 Dysphagia, unspecified: Secondary | ICD-10-CM | POA: Diagnosis not present

## 2016-06-26 DIAGNOSIS — R634 Abnormal weight loss: Secondary | ICD-10-CM | POA: Diagnosis not present

## 2016-06-26 DIAGNOSIS — G301 Alzheimer's disease with late onset: Secondary | ICD-10-CM | POA: Diagnosis not present

## 2016-06-26 DIAGNOSIS — F0280 Dementia in other diseases classified elsewhere without behavioral disturbance: Secondary | ICD-10-CM | POA: Diagnosis not present

## 2016-06-26 DIAGNOSIS — Z681 Body mass index (BMI) 19 or less, adult: Secondary | ICD-10-CM | POA: Diagnosis not present

## 2016-06-26 DIAGNOSIS — I10 Essential (primary) hypertension: Secondary | ICD-10-CM | POA: Diagnosis not present

## 2016-06-30 DIAGNOSIS — R1310 Dysphagia, unspecified: Secondary | ICD-10-CM | POA: Diagnosis not present

## 2016-06-30 DIAGNOSIS — R634 Abnormal weight loss: Secondary | ICD-10-CM | POA: Diagnosis not present

## 2016-06-30 DIAGNOSIS — F0280 Dementia in other diseases classified elsewhere without behavioral disturbance: Secondary | ICD-10-CM | POA: Diagnosis not present

## 2016-06-30 DIAGNOSIS — Z681 Body mass index (BMI) 19 or less, adult: Secondary | ICD-10-CM | POA: Diagnosis not present

## 2016-06-30 DIAGNOSIS — I10 Essential (primary) hypertension: Secondary | ICD-10-CM | POA: Diagnosis not present

## 2016-06-30 DIAGNOSIS — G301 Alzheimer's disease with late onset: Secondary | ICD-10-CM | POA: Diagnosis not present

## 2016-07-07 DIAGNOSIS — Z681 Body mass index (BMI) 19 or less, adult: Secondary | ICD-10-CM | POA: Diagnosis not present

## 2016-07-07 DIAGNOSIS — R634 Abnormal weight loss: Secondary | ICD-10-CM | POA: Diagnosis not present

## 2016-07-07 DIAGNOSIS — R1310 Dysphagia, unspecified: Secondary | ICD-10-CM | POA: Diagnosis not present

## 2016-07-07 DIAGNOSIS — G301 Alzheimer's disease with late onset: Secondary | ICD-10-CM | POA: Diagnosis not present

## 2016-07-07 DIAGNOSIS — I10 Essential (primary) hypertension: Secondary | ICD-10-CM | POA: Diagnosis not present

## 2016-07-07 DIAGNOSIS — F0280 Dementia in other diseases classified elsewhere without behavioral disturbance: Secondary | ICD-10-CM | POA: Diagnosis not present

## 2016-07-14 DIAGNOSIS — Z681 Body mass index (BMI) 19 or less, adult: Secondary | ICD-10-CM | POA: Diagnosis not present

## 2016-07-14 DIAGNOSIS — R1310 Dysphagia, unspecified: Secondary | ICD-10-CM | POA: Diagnosis not present

## 2016-07-14 DIAGNOSIS — I10 Essential (primary) hypertension: Secondary | ICD-10-CM | POA: Diagnosis not present

## 2016-07-14 DIAGNOSIS — F0280 Dementia in other diseases classified elsewhere without behavioral disturbance: Secondary | ICD-10-CM | POA: Diagnosis not present

## 2016-07-14 DIAGNOSIS — R634 Abnormal weight loss: Secondary | ICD-10-CM | POA: Diagnosis not present

## 2016-07-14 DIAGNOSIS — G301 Alzheimer's disease with late onset: Secondary | ICD-10-CM | POA: Diagnosis not present

## 2016-07-21 DIAGNOSIS — G301 Alzheimer's disease with late onset: Secondary | ICD-10-CM | POA: Diagnosis not present

## 2016-07-21 DIAGNOSIS — R634 Abnormal weight loss: Secondary | ICD-10-CM | POA: Diagnosis not present

## 2016-07-21 DIAGNOSIS — F0280 Dementia in other diseases classified elsewhere without behavioral disturbance: Secondary | ICD-10-CM | POA: Diagnosis not present

## 2016-07-21 DIAGNOSIS — I10 Essential (primary) hypertension: Secondary | ICD-10-CM | POA: Diagnosis not present

## 2016-07-21 DIAGNOSIS — R1310 Dysphagia, unspecified: Secondary | ICD-10-CM | POA: Diagnosis not present

## 2016-07-21 DIAGNOSIS — Z681 Body mass index (BMI) 19 or less, adult: Secondary | ICD-10-CM | POA: Diagnosis not present

## 2016-07-26 DIAGNOSIS — F0280 Dementia in other diseases classified elsewhere without behavioral disturbance: Secondary | ICD-10-CM | POA: Diagnosis not present

## 2016-07-26 DIAGNOSIS — R1310 Dysphagia, unspecified: Secondary | ICD-10-CM | POA: Diagnosis not present

## 2016-07-26 DIAGNOSIS — R634 Abnormal weight loss: Secondary | ICD-10-CM | POA: Diagnosis not present

## 2016-07-26 DIAGNOSIS — Z681 Body mass index (BMI) 19 or less, adult: Secondary | ICD-10-CM | POA: Diagnosis not present

## 2016-07-26 DIAGNOSIS — I10 Essential (primary) hypertension: Secondary | ICD-10-CM | POA: Diagnosis not present

## 2016-07-26 DIAGNOSIS — G301 Alzheimer's disease with late onset: Secondary | ICD-10-CM | POA: Diagnosis not present

## 2016-07-28 DIAGNOSIS — F0280 Dementia in other diseases classified elsewhere without behavioral disturbance: Secondary | ICD-10-CM | POA: Diagnosis not present

## 2016-07-28 DIAGNOSIS — I10 Essential (primary) hypertension: Secondary | ICD-10-CM | POA: Diagnosis not present

## 2016-07-28 DIAGNOSIS — R634 Abnormal weight loss: Secondary | ICD-10-CM | POA: Diagnosis not present

## 2016-07-28 DIAGNOSIS — G301 Alzheimer's disease with late onset: Secondary | ICD-10-CM | POA: Diagnosis not present

## 2016-07-28 DIAGNOSIS — Z681 Body mass index (BMI) 19 or less, adult: Secondary | ICD-10-CM | POA: Diagnosis not present

## 2016-07-28 DIAGNOSIS — R1310 Dysphagia, unspecified: Secondary | ICD-10-CM | POA: Diagnosis not present

## 2016-08-04 DIAGNOSIS — G301 Alzheimer's disease with late onset: Secondary | ICD-10-CM | POA: Diagnosis not present

## 2016-08-04 DIAGNOSIS — I10 Essential (primary) hypertension: Secondary | ICD-10-CM | POA: Diagnosis not present

## 2016-08-04 DIAGNOSIS — R634 Abnormal weight loss: Secondary | ICD-10-CM | POA: Diagnosis not present

## 2016-08-04 DIAGNOSIS — Z681 Body mass index (BMI) 19 or less, adult: Secondary | ICD-10-CM | POA: Diagnosis not present

## 2016-08-04 DIAGNOSIS — R1310 Dysphagia, unspecified: Secondary | ICD-10-CM | POA: Diagnosis not present

## 2016-08-04 DIAGNOSIS — F0280 Dementia in other diseases classified elsewhere without behavioral disturbance: Secondary | ICD-10-CM | POA: Diagnosis not present

## 2016-08-11 DIAGNOSIS — G301 Alzheimer's disease with late onset: Secondary | ICD-10-CM | POA: Diagnosis not present

## 2016-08-11 DIAGNOSIS — F0280 Dementia in other diseases classified elsewhere without behavioral disturbance: Secondary | ICD-10-CM | POA: Diagnosis not present

## 2016-08-11 DIAGNOSIS — R634 Abnormal weight loss: Secondary | ICD-10-CM | POA: Diagnosis not present

## 2016-08-11 DIAGNOSIS — I10 Essential (primary) hypertension: Secondary | ICD-10-CM | POA: Diagnosis not present

## 2016-08-11 DIAGNOSIS — R1310 Dysphagia, unspecified: Secondary | ICD-10-CM | POA: Diagnosis not present

## 2016-08-11 DIAGNOSIS — Z681 Body mass index (BMI) 19 or less, adult: Secondary | ICD-10-CM | POA: Diagnosis not present

## 2016-08-15 DIAGNOSIS — R1310 Dysphagia, unspecified: Secondary | ICD-10-CM | POA: Diagnosis not present

## 2016-08-15 DIAGNOSIS — R634 Abnormal weight loss: Secondary | ICD-10-CM | POA: Diagnosis not present

## 2016-08-15 DIAGNOSIS — Z681 Body mass index (BMI) 19 or less, adult: Secondary | ICD-10-CM | POA: Diagnosis not present

## 2016-08-15 DIAGNOSIS — I10 Essential (primary) hypertension: Secondary | ICD-10-CM | POA: Diagnosis not present

## 2016-08-15 DIAGNOSIS — F0280 Dementia in other diseases classified elsewhere without behavioral disturbance: Secondary | ICD-10-CM | POA: Diagnosis not present

## 2016-08-15 DIAGNOSIS — G301 Alzheimer's disease with late onset: Secondary | ICD-10-CM | POA: Diagnosis not present

## 2016-08-16 DIAGNOSIS — Z681 Body mass index (BMI) 19 or less, adult: Secondary | ICD-10-CM | POA: Diagnosis not present

## 2016-08-16 DIAGNOSIS — F0280 Dementia in other diseases classified elsewhere without behavioral disturbance: Secondary | ICD-10-CM | POA: Diagnosis not present

## 2016-08-16 DIAGNOSIS — I10 Essential (primary) hypertension: Secondary | ICD-10-CM | POA: Diagnosis not present

## 2016-08-16 DIAGNOSIS — G301 Alzheimer's disease with late onset: Secondary | ICD-10-CM | POA: Diagnosis not present

## 2016-08-16 DIAGNOSIS — R1310 Dysphagia, unspecified: Secondary | ICD-10-CM | POA: Diagnosis not present

## 2016-08-16 DIAGNOSIS — R634 Abnormal weight loss: Secondary | ICD-10-CM | POA: Diagnosis not present

## 2016-08-19 DIAGNOSIS — I10 Essential (primary) hypertension: Secondary | ICD-10-CM | POA: Diagnosis not present

## 2016-08-19 DIAGNOSIS — R1310 Dysphagia, unspecified: Secondary | ICD-10-CM | POA: Diagnosis not present

## 2016-08-19 DIAGNOSIS — G301 Alzheimer's disease with late onset: Secondary | ICD-10-CM | POA: Diagnosis not present

## 2016-08-19 DIAGNOSIS — Z681 Body mass index (BMI) 19 or less, adult: Secondary | ICD-10-CM | POA: Diagnosis not present

## 2016-08-19 DIAGNOSIS — R634 Abnormal weight loss: Secondary | ICD-10-CM | POA: Diagnosis not present

## 2016-08-19 DIAGNOSIS — F0280 Dementia in other diseases classified elsewhere without behavioral disturbance: Secondary | ICD-10-CM | POA: Diagnosis not present

## 2016-08-25 DIAGNOSIS — R634 Abnormal weight loss: Secondary | ICD-10-CM | POA: Diagnosis not present

## 2016-08-25 DIAGNOSIS — R1310 Dysphagia, unspecified: Secondary | ICD-10-CM | POA: Diagnosis not present

## 2016-08-25 DIAGNOSIS — G301 Alzheimer's disease with late onset: Secondary | ICD-10-CM | POA: Diagnosis not present

## 2016-08-25 DIAGNOSIS — F0280 Dementia in other diseases classified elsewhere without behavioral disturbance: Secondary | ICD-10-CM | POA: Diagnosis not present

## 2016-08-25 DIAGNOSIS — Z681 Body mass index (BMI) 19 or less, adult: Secondary | ICD-10-CM | POA: Diagnosis not present

## 2016-08-25 DIAGNOSIS — I10 Essential (primary) hypertension: Secondary | ICD-10-CM | POA: Diagnosis not present

## 2016-08-26 DIAGNOSIS — G301 Alzheimer's disease with late onset: Secondary | ICD-10-CM | POA: Diagnosis not present

## 2016-08-26 DIAGNOSIS — I10 Essential (primary) hypertension: Secondary | ICD-10-CM | POA: Diagnosis not present

## 2016-08-26 DIAGNOSIS — R634 Abnormal weight loss: Secondary | ICD-10-CM | POA: Diagnosis not present

## 2016-08-26 DIAGNOSIS — Z681 Body mass index (BMI) 19 or less, adult: Secondary | ICD-10-CM | POA: Diagnosis not present

## 2016-08-26 DIAGNOSIS — F0280 Dementia in other diseases classified elsewhere without behavioral disturbance: Secondary | ICD-10-CM | POA: Diagnosis not present

## 2016-08-26 DIAGNOSIS — R1310 Dysphagia, unspecified: Secondary | ICD-10-CM | POA: Diagnosis not present

## 2016-09-01 DIAGNOSIS — G301 Alzheimer's disease with late onset: Secondary | ICD-10-CM | POA: Diagnosis not present

## 2016-09-01 DIAGNOSIS — I10 Essential (primary) hypertension: Secondary | ICD-10-CM | POA: Diagnosis not present

## 2016-09-01 DIAGNOSIS — R1310 Dysphagia, unspecified: Secondary | ICD-10-CM | POA: Diagnosis not present

## 2016-09-01 DIAGNOSIS — R634 Abnormal weight loss: Secondary | ICD-10-CM | POA: Diagnosis not present

## 2016-09-01 DIAGNOSIS — F0280 Dementia in other diseases classified elsewhere without behavioral disturbance: Secondary | ICD-10-CM | POA: Diagnosis not present

## 2016-09-01 DIAGNOSIS — Z681 Body mass index (BMI) 19 or less, adult: Secondary | ICD-10-CM | POA: Diagnosis not present

## 2016-09-08 DIAGNOSIS — R634 Abnormal weight loss: Secondary | ICD-10-CM | POA: Diagnosis not present

## 2016-09-08 DIAGNOSIS — Z681 Body mass index (BMI) 19 or less, adult: Secondary | ICD-10-CM | POA: Diagnosis not present

## 2016-09-08 DIAGNOSIS — G301 Alzheimer's disease with late onset: Secondary | ICD-10-CM | POA: Diagnosis not present

## 2016-09-08 DIAGNOSIS — F0280 Dementia in other diseases classified elsewhere without behavioral disturbance: Secondary | ICD-10-CM | POA: Diagnosis not present

## 2016-09-08 DIAGNOSIS — R1310 Dysphagia, unspecified: Secondary | ICD-10-CM | POA: Diagnosis not present

## 2016-09-08 DIAGNOSIS — I10 Essential (primary) hypertension: Secondary | ICD-10-CM | POA: Diagnosis not present

## 2016-09-10 DIAGNOSIS — R634 Abnormal weight loss: Secondary | ICD-10-CM | POA: Diagnosis not present

## 2016-09-10 DIAGNOSIS — Z681 Body mass index (BMI) 19 or less, adult: Secondary | ICD-10-CM | POA: Diagnosis not present

## 2016-09-10 DIAGNOSIS — F0280 Dementia in other diseases classified elsewhere without behavioral disturbance: Secondary | ICD-10-CM | POA: Diagnosis not present

## 2016-09-10 DIAGNOSIS — G301 Alzheimer's disease with late onset: Secondary | ICD-10-CM | POA: Diagnosis not present

## 2016-09-10 DIAGNOSIS — R1310 Dysphagia, unspecified: Secondary | ICD-10-CM | POA: Diagnosis not present

## 2016-09-10 DIAGNOSIS — I10 Essential (primary) hypertension: Secondary | ICD-10-CM | POA: Diagnosis not present

## 2016-09-12 DIAGNOSIS — I10 Essential (primary) hypertension: Secondary | ICD-10-CM | POA: Diagnosis not present

## 2016-09-12 DIAGNOSIS — R634 Abnormal weight loss: Secondary | ICD-10-CM | POA: Diagnosis not present

## 2016-09-12 DIAGNOSIS — F0280 Dementia in other diseases classified elsewhere without behavioral disturbance: Secondary | ICD-10-CM | POA: Diagnosis not present

## 2016-09-12 DIAGNOSIS — G301 Alzheimer's disease with late onset: Secondary | ICD-10-CM | POA: Diagnosis not present

## 2016-09-12 DIAGNOSIS — Z681 Body mass index (BMI) 19 or less, adult: Secondary | ICD-10-CM | POA: Diagnosis not present

## 2016-09-12 DIAGNOSIS — R1310 Dysphagia, unspecified: Secondary | ICD-10-CM | POA: Diagnosis not present

## 2016-09-15 DIAGNOSIS — R634 Abnormal weight loss: Secondary | ICD-10-CM | POA: Diagnosis not present

## 2016-09-15 DIAGNOSIS — R1310 Dysphagia, unspecified: Secondary | ICD-10-CM | POA: Diagnosis not present

## 2016-09-15 DIAGNOSIS — I10 Essential (primary) hypertension: Secondary | ICD-10-CM | POA: Diagnosis not present

## 2016-09-15 DIAGNOSIS — G301 Alzheimer's disease with late onset: Secondary | ICD-10-CM | POA: Diagnosis not present

## 2016-09-15 DIAGNOSIS — Z681 Body mass index (BMI) 19 or less, adult: Secondary | ICD-10-CM | POA: Diagnosis not present

## 2016-09-15 DIAGNOSIS — F0280 Dementia in other diseases classified elsewhere without behavioral disturbance: Secondary | ICD-10-CM | POA: Diagnosis not present

## 2016-09-22 DIAGNOSIS — G301 Alzheimer's disease with late onset: Secondary | ICD-10-CM | POA: Diagnosis not present

## 2016-09-22 DIAGNOSIS — R634 Abnormal weight loss: Secondary | ICD-10-CM | POA: Diagnosis not present

## 2016-09-22 DIAGNOSIS — F0280 Dementia in other diseases classified elsewhere without behavioral disturbance: Secondary | ICD-10-CM | POA: Diagnosis not present

## 2016-09-22 DIAGNOSIS — I10 Essential (primary) hypertension: Secondary | ICD-10-CM | POA: Diagnosis not present

## 2016-09-22 DIAGNOSIS — R1310 Dysphagia, unspecified: Secondary | ICD-10-CM | POA: Diagnosis not present

## 2016-09-22 DIAGNOSIS — Z681 Body mass index (BMI) 19 or less, adult: Secondary | ICD-10-CM | POA: Diagnosis not present

## 2016-09-25 DIAGNOSIS — G301 Alzheimer's disease with late onset: Secondary | ICD-10-CM | POA: Diagnosis not present

## 2016-09-25 DIAGNOSIS — Z681 Body mass index (BMI) 19 or less, adult: Secondary | ICD-10-CM | POA: Diagnosis not present

## 2016-09-25 DIAGNOSIS — I10 Essential (primary) hypertension: Secondary | ICD-10-CM | POA: Diagnosis not present

## 2016-09-25 DIAGNOSIS — R1310 Dysphagia, unspecified: Secondary | ICD-10-CM | POA: Diagnosis not present

## 2016-09-25 DIAGNOSIS — R634 Abnormal weight loss: Secondary | ICD-10-CM | POA: Diagnosis not present

## 2016-09-25 DIAGNOSIS — F0280 Dementia in other diseases classified elsewhere without behavioral disturbance: Secondary | ICD-10-CM | POA: Diagnosis not present

## 2016-10-05 DIAGNOSIS — I10 Essential (primary) hypertension: Secondary | ICD-10-CM | POA: Diagnosis not present

## 2016-10-05 DIAGNOSIS — Z681 Body mass index (BMI) 19 or less, adult: Secondary | ICD-10-CM | POA: Diagnosis not present

## 2016-10-05 DIAGNOSIS — G301 Alzheimer's disease with late onset: Secondary | ICD-10-CM | POA: Diagnosis not present

## 2016-10-05 DIAGNOSIS — R1310 Dysphagia, unspecified: Secondary | ICD-10-CM | POA: Diagnosis not present

## 2016-10-05 DIAGNOSIS — F0280 Dementia in other diseases classified elsewhere without behavioral disturbance: Secondary | ICD-10-CM | POA: Diagnosis not present

## 2016-10-05 DIAGNOSIS — R634 Abnormal weight loss: Secondary | ICD-10-CM | POA: Diagnosis not present

## 2016-10-13 DIAGNOSIS — R634 Abnormal weight loss: Secondary | ICD-10-CM | POA: Diagnosis not present

## 2016-10-13 DIAGNOSIS — F0280 Dementia in other diseases classified elsewhere without behavioral disturbance: Secondary | ICD-10-CM | POA: Diagnosis not present

## 2016-10-13 DIAGNOSIS — R1310 Dysphagia, unspecified: Secondary | ICD-10-CM | POA: Diagnosis not present

## 2016-10-13 DIAGNOSIS — G301 Alzheimer's disease with late onset: Secondary | ICD-10-CM | POA: Diagnosis not present

## 2016-10-13 DIAGNOSIS — Z681 Body mass index (BMI) 19 or less, adult: Secondary | ICD-10-CM | POA: Diagnosis not present

## 2016-10-13 DIAGNOSIS — I10 Essential (primary) hypertension: Secondary | ICD-10-CM | POA: Diagnosis not present

## 2016-10-14 DIAGNOSIS — Z681 Body mass index (BMI) 19 or less, adult: Secondary | ICD-10-CM | POA: Diagnosis not present

## 2016-10-14 DIAGNOSIS — R1310 Dysphagia, unspecified: Secondary | ICD-10-CM | POA: Diagnosis not present

## 2016-10-14 DIAGNOSIS — I10 Essential (primary) hypertension: Secondary | ICD-10-CM | POA: Diagnosis not present

## 2016-10-14 DIAGNOSIS — F0280 Dementia in other diseases classified elsewhere without behavioral disturbance: Secondary | ICD-10-CM | POA: Diagnosis not present

## 2016-10-14 DIAGNOSIS — G301 Alzheimer's disease with late onset: Secondary | ICD-10-CM | POA: Diagnosis not present

## 2016-10-14 DIAGNOSIS — R634 Abnormal weight loss: Secondary | ICD-10-CM | POA: Diagnosis not present

## 2016-10-20 DIAGNOSIS — R634 Abnormal weight loss: Secondary | ICD-10-CM | POA: Diagnosis not present

## 2016-10-20 DIAGNOSIS — F0280 Dementia in other diseases classified elsewhere without behavioral disturbance: Secondary | ICD-10-CM | POA: Diagnosis not present

## 2016-10-20 DIAGNOSIS — Z681 Body mass index (BMI) 19 or less, adult: Secondary | ICD-10-CM | POA: Diagnosis not present

## 2016-10-20 DIAGNOSIS — G301 Alzheimer's disease with late onset: Secondary | ICD-10-CM | POA: Diagnosis not present

## 2016-10-20 DIAGNOSIS — I10 Essential (primary) hypertension: Secondary | ICD-10-CM | POA: Diagnosis not present

## 2016-10-20 DIAGNOSIS — R1310 Dysphagia, unspecified: Secondary | ICD-10-CM | POA: Diagnosis not present

## 2016-10-21 ENCOUNTER — Other Ambulatory Visit: Payer: Self-pay

## 2016-10-26 DIAGNOSIS — F0280 Dementia in other diseases classified elsewhere without behavioral disturbance: Secondary | ICD-10-CM | POA: Diagnosis not present

## 2016-10-26 DIAGNOSIS — R1310 Dysphagia, unspecified: Secondary | ICD-10-CM | POA: Diagnosis not present

## 2016-10-26 DIAGNOSIS — G301 Alzheimer's disease with late onset: Secondary | ICD-10-CM | POA: Diagnosis not present

## 2016-10-26 DIAGNOSIS — I10 Essential (primary) hypertension: Secondary | ICD-10-CM | POA: Diagnosis not present

## 2016-10-26 DIAGNOSIS — R634 Abnormal weight loss: Secondary | ICD-10-CM | POA: Diagnosis not present

## 2016-10-26 DIAGNOSIS — Z681 Body mass index (BMI) 19 or less, adult: Secondary | ICD-10-CM | POA: Diagnosis not present

## 2016-10-27 DIAGNOSIS — R1310 Dysphagia, unspecified: Secondary | ICD-10-CM | POA: Diagnosis not present

## 2016-10-27 DIAGNOSIS — G301 Alzheimer's disease with late onset: Secondary | ICD-10-CM | POA: Diagnosis not present

## 2016-10-27 DIAGNOSIS — F0280 Dementia in other diseases classified elsewhere without behavioral disturbance: Secondary | ICD-10-CM | POA: Diagnosis not present

## 2016-10-27 DIAGNOSIS — Z681 Body mass index (BMI) 19 or less, adult: Secondary | ICD-10-CM | POA: Diagnosis not present

## 2016-10-27 DIAGNOSIS — R634 Abnormal weight loss: Secondary | ICD-10-CM | POA: Diagnosis not present

## 2016-10-27 DIAGNOSIS — I10 Essential (primary) hypertension: Secondary | ICD-10-CM | POA: Diagnosis not present

## 2016-10-31 DIAGNOSIS — R634 Abnormal weight loss: Secondary | ICD-10-CM | POA: Diagnosis not present

## 2016-10-31 DIAGNOSIS — F0280 Dementia in other diseases classified elsewhere without behavioral disturbance: Secondary | ICD-10-CM | POA: Diagnosis not present

## 2016-10-31 DIAGNOSIS — R1310 Dysphagia, unspecified: Secondary | ICD-10-CM | POA: Diagnosis not present

## 2016-10-31 DIAGNOSIS — I10 Essential (primary) hypertension: Secondary | ICD-10-CM | POA: Diagnosis not present

## 2016-10-31 DIAGNOSIS — Z681 Body mass index (BMI) 19 or less, adult: Secondary | ICD-10-CM | POA: Diagnosis not present

## 2016-10-31 DIAGNOSIS — G301 Alzheimer's disease with late onset: Secondary | ICD-10-CM | POA: Diagnosis not present

## 2016-11-02 DIAGNOSIS — Z681 Body mass index (BMI) 19 or less, adult: Secondary | ICD-10-CM | POA: Diagnosis not present

## 2016-11-02 DIAGNOSIS — G301 Alzheimer's disease with late onset: Secondary | ICD-10-CM | POA: Diagnosis not present

## 2016-11-02 DIAGNOSIS — I10 Essential (primary) hypertension: Secondary | ICD-10-CM | POA: Diagnosis not present

## 2016-11-02 DIAGNOSIS — R634 Abnormal weight loss: Secondary | ICD-10-CM | POA: Diagnosis not present

## 2016-11-02 DIAGNOSIS — F0280 Dementia in other diseases classified elsewhere without behavioral disturbance: Secondary | ICD-10-CM | POA: Diagnosis not present

## 2016-11-02 DIAGNOSIS — R1310 Dysphagia, unspecified: Secondary | ICD-10-CM | POA: Diagnosis not present

## 2016-11-08 ENCOUNTER — Ambulatory Visit: Payer: Medicare Other | Admitting: Family Medicine

## 2016-11-14 ENCOUNTER — Telehealth: Payer: Self-pay | Admitting: Family Medicine

## 2016-11-14 NOTE — Telephone Encounter (Signed)
Sounds like it may need an antibiotic but hard to tell without seeing it  Can they come in this week?

## 2016-11-14 NOTE — Telephone Encounter (Signed)
Called patient's daughter back. She stated that patient fell about 3-4 days ago.  Scrapped her right forearm. 2 skin tears present.  They have been applying tegaderms, but switched to neosporin bandaids. Home health nurse cleaned with NS today and noticed that the sites were red, had some puss oozing, and little bit of an odor.  However, the sites are not hot touch and no fever.  Pt is scheduled to come in on Tuesday, 8/28.  Please advise on what daughter should do?  Daughter wants to know if an antibiotic is recommended?

## 2016-11-14 NOTE — Telephone Encounter (Signed)
Caller name: Diane Relation to KR:[id number] Call back number:[phone] Pharmacy:  Reason for call: pt's daughter states the home nurse informed her that pt fell and scraped her arm, daughter would like to know what she should use on it states it is not hot to the touch however the home nurse states it does have some puss, pt has appt on 8/28 however daughter wants to know is it ok to wait that long and if so what can she put on it in the mean time

## 2016-11-15 ENCOUNTER — Ambulatory Visit (INDEPENDENT_AMBULATORY_CARE_PROVIDER_SITE_OTHER): Payer: Medicare Other | Admitting: Medical

## 2016-11-15 ENCOUNTER — Encounter: Payer: Self-pay | Admitting: Medical

## 2016-11-15 VITALS — BP 120/62 | HR 66 | Temp 97.5°F | Resp 14

## 2016-11-15 DIAGNOSIS — F0280 Dementia in other diseases classified elsewhere without behavioral disturbance: Secondary | ICD-10-CM | POA: Diagnosis not present

## 2016-11-15 DIAGNOSIS — T148XXA Other injury of unspecified body region, initial encounter: Secondary | ICD-10-CM | POA: Diagnosis not present

## 2016-11-15 DIAGNOSIS — G309 Alzheimer's disease, unspecified: Secondary | ICD-10-CM | POA: Diagnosis not present

## 2016-11-15 DIAGNOSIS — L089 Local infection of the skin and subcutaneous tissue, unspecified: Secondary | ICD-10-CM

## 2016-11-15 DIAGNOSIS — F411 Generalized anxiety disorder: Secondary | ICD-10-CM

## 2016-11-15 MED ORDER — MUPIROCIN 2 % EX OINT: TOPICAL_OINTMENT | CUTANEOUS | 0 refills | Status: DC

## 2016-11-15 MED ORDER — LISINOPRIL 10 MG PO TABS: ORAL_TABLET | ORAL | 1 refills | Status: DC

## 2016-11-15 MED ORDER — ALPRAZOLAM 0.25 MG PO TABS
ORAL_TABLET | ORAL | 0 refills | Status: DC
Start: 2016-11-15 — End: 2016-11-25

## 2016-11-15 MED ORDER — LEVOTHYROXINE SODIUM 75 MCG PO TABS: 75.0000 ug | ORAL_TABLET | Freq: Every day | ORAL | 1 refills | Status: DC

## 2016-11-15 MED ORDER — MEMANTINE HCL ER 7 MG PO CP24: 7.0000 mg | ORAL_CAPSULE | Freq: Every day | ORAL | 2 refills | Status: DC

## 2016-11-15 NOTE — Patient Instructions (Addendum)
For rt forearrm probable skin infection of abrasion we have a culture pending. Mupirocin topical antibiotic.  Non stick dressing to wounds/friable skin. Change twice daily.(MA how to change dressing. I instructed want them to change dressing twice daily. Once in early am 8 am and second early evening.(if possible leave open to air for 2 hours each day  provided pt won't pick at wound.)  Follow up on Friday 1 pm recommended. May need wound care consult in light of patient age if wound is not healing.

## 2016-11-15 NOTE — Progress Notes (Signed)
Subjective:    Patient ID: Virginia Davidson, female    DOB: Apr 30, 1916, 83 y.o.   MRN: [id number]  HPI  Pt in today and had abrasions to rt forearm and left proximal forearm. She hit something and it unclear. What she hit. May have fell going to bathroom.  This happened on August 13,2018 about one week ago.  [facility] nurse at [facility] came in. Treated with saline and neosporin. Dressing was very sticky and seemed to pull off the skin worse.  No fevers,no chills or sweats. The wound is smelling.   Pt used to see hospice but now just sees [facility].    Review of Systems  Constitutional: Negative for chills, fatigue and fever.  Respiratory: Negative for cough, chest tightness, shortness of breath and wheezing.   Cardiovascular: Negative for chest pain and palpitations.  Skin: Negative for rash.       See hpi.  Neurological: Negative for dizziness.  Hematological: Negative for adenopathy. Does not bruise/bleed easily.  Psychiatric/Behavioral: Negative for behavioral problems, confusion and decreased concentration.   Past Medical History:  Diagnosis Date  . Abdominal aneurysm ([place])   . Dementia   . Diverticulitis   . Glaucoma   . Hypertension   . Thyroid disease   . Urinary incontinence      Social History   Social History  . Marital status: Widowed    Spouse name: N/A  . Number of children: N/A  . Years of education: N/A   Occupational History  . Not on file.   Social History Main Topics  . Smoking status: Never Smoker  . Smokeless tobacco: Never Used  . Alcohol use No  . Drug use: No  . Sexual activity: Not Currently   Other Topics Concern  . Not on file   Social History Narrative   Walking -- exercise    Past Surgical History:  Procedure Laterality Date  . BLADDER SURGERY N/A    x2  . HIP SURGERY Right     Family History  Problem Relation Age of Onset  . Stroke Mother     Allergies  Allergen Reactions  . Penicillins Rash    Family is  not sure of reaction as far as breathing issues are concerned  . Sulfamethoxazole Rash  . Alendronate Sodium     Unknown Reaction per caregiver  . Tetracyclines & Related     Unknown Reaction per caregiver    Current Outpatient Prescriptions on File Prior to Visit  Medication Sig Dispense Refill  . Calcium Carbonate-Vitamin D 600-200 MG-UNIT CAPS Take 1 tablet by mouth 2 (two) times daily.    . nitrofurantoin, macrocrystal-monohydrate, (MACROBID) 100 MG capsule Take 1 capsule (100 mg total) by mouth 2 (two) times daily. 14 capsule 0  . nitroGLYCERIN (NITROSTAT) 0.4 MG SL tablet Place 0.4 mg under the tongue every 5 (five) minutes as needed for chest pain. Reported on 06/01/2015    . NON FORMULARY DNR comfort measures only    . ranitidine (ZANTAC) 150 MG tablet Take 1 tablet (150 mg total) by mouth 2 (two) times daily. (Patient taking differently: Take 150 mg by mouth at bedtime. ) 30 tablet 5  . senna-docusate (SENOKOT-S) 8.6-50 MG tablet Take 1 tablet by mouth daily as needed for mild constipation.     No current facility-administered medications on file prior to visit.     BP 120/62   Pulse 66   Temp (!) 97.5 F (36.4 C) (Oral)   Resp 14  SpO2 100%       Objective:   Physical Exam  General- No acute distress. Pleasant patient. Neck- Full range of motion, no jvd Lungs- Clear, even and unlabored. Heart- regular rate and rhythm. Neurologic- CNII- XII grossly intact.  Rt forearm-  Proximal forearm 6 cm x 2 cm wide abrasion with 2  Moderate areas deep wound about 8 mm in diameter. Friable skin. Distal forearm 2 cm x 1 cm. Abrasion. No yellow dc. But pungent smell to wound.  Left forearm- proximal aspect. Small healed wound. Not open.      Assessment & Plan:  For rt forearrm probable skin infection of abrasion we have culture pending. Mupirocin topical antibiotic.  Non stick dressing to wounds/friable skin. Change twice daily.(MA how to change dressing. I insructed  want  them to change dressing twice daily. Once in early am 8 am and second early evening.(if possible leave open to air for 2 hours each day provided pt won't pick at wound.)  Follow up on Friday 1 pm recommended. May need wound care consult in light of patient age if wound is not healing.  Caven Perine, Percell Miller, PA-C

## 2016-11-16 NOTE — Telephone Encounter (Signed)
Pt came in yesterday (11/15/16) to see Mackie Pai, PA-C.

## 2016-11-18 ENCOUNTER — Encounter: Payer: Self-pay | Admitting: Medical

## 2016-11-18 ENCOUNTER — Ambulatory Visit (INDEPENDENT_AMBULATORY_CARE_PROVIDER_SITE_OTHER): Payer: Medicare Other | Admitting: Medical

## 2016-11-18 DIAGNOSIS — L089 Local infection of the skin and subcutaneous tissue, unspecified: Secondary | ICD-10-CM

## 2016-11-18 DIAGNOSIS — T148XXA Other injury of unspecified body region, initial encounter: Secondary | ICD-10-CM

## 2016-11-18 LAB — WOUND CULTURE: GRAM STAIN: NONE SEEN

## 2016-11-18 MED ORDER — MUPIROCIN 2 % EX OINT: TOPICAL_OINTMENT | CUTANEOUS | 2 refills | Status: DC

## 2016-11-18 MED ORDER — CLINDAMYCIN HCL 75 MG PO CAPS: ORAL_CAPSULE | ORAL | 0 refills | Status: DC

## 2016-11-18 NOTE — Patient Instructions (Signed)
The right forearm/wound infection looks a lot better. This is only on topical mupirocin despite the fact that abundant MRSA grew out on the culture.  I want to continue current treatment of topical mupirocin on twice daily and dressing changes twice daily. We'll see if the area continues to improve.  However if over the weekend the wound/abrasion areas worsen as described then please start the clindamycin low dose antibiotic and start to use probiotics as well.  Follow-up as regularly scheduled with Dr. Etter Davidson on this coming Tuesday or as needed.

## 2016-11-18 NOTE — Progress Notes (Signed)
Subjective:    Patient ID: Virginia Davidson, female    DOB: 02-05-1917, 83 y.o.   MRN: [id number]  HPI   Patient is in today for follow-up on her right arm wound with infection. See the last note please. Patient was given mupirocin on topical antibiotic to put over the abrasion wounds and incised to have dressing changed twice daily. I did a culture to evaluate the bacteria. It did grow out abundant MRSA. The area no longer has the obvious pungent/foul-smelling. Nurse and relatives state that the prior shallow wounds/ulcers have filled in. Patient has no fevers chills or sweats.   Review of Systems  Constitutional: Negative for chills, fatigue and fever.  Respiratory: Negative for cough, shortness of breath and wheezing.   Cardiovascular: Negative for chest pain and palpitations.  Skin:       See physical exam  Neurological: Negative for dizziness, weakness and headaches.  Hematological: Negative for adenopathy. Does not bruise/bleed easily.  Psychiatric/Behavioral: Negative for behavioral problems and confusion.   Past Medical History:  Diagnosis Date  . Abdominal aneurysm ([place])   . Dementia   . Diverticulitis   . Glaucoma   . Hypertension   . Thyroid disease   . Urinary incontinence      Social History   Social History  . Marital status: Widowed    Spouse name: N/A  . Number of children: N/A  . Years of education: N/A   Occupational History  . Not on file.   Social History Main Topics  . Smoking status: Never Smoker  . Smokeless tobacco: Never Used  . Alcohol use No  . Drug use: No  . Sexual activity: Not Currently   Other Topics Concern  . Not on file   Social History Narrative   Walking -- exercise    Past Surgical History:  Procedure Laterality Date  . BLADDER SURGERY N/A    x2  . HIP SURGERY Right     Family History  Problem Relation Age of Onset  . Stroke Mother     Allergies  Allergen Reactions  . Penicillins Rash    Family is not sure of  reaction as far as breathing issues are concerned  . Sulfamethoxazole Rash  . Alendronate Sodium     Unknown Reaction per caregiver  . Tetracyclines & Related     Unknown Reaction per caregiver    Current Outpatient Prescriptions on File Prior to Visit  Medication Sig Dispense Refill  . ALPRAZolam (XANAX) 0.25 MG tablet TAKE ONE TABLET 3 TIMES A DAY AS NEEDED. 30 tablet 0  . Calcium Carbonate-Vitamin D 600-200 MG-UNIT CAPS Take 1 tablet by mouth 2 (two) times daily.    Marland Kitchen levothyroxine (SYNTHROID, LEVOTHROID) 75 MCG tablet Take 1 tablet (75 mcg total) by mouth daily. 90 tablet 1  . lisinopril (PRINIVIL,ZESTRIL) 10 MG tablet TAKE ONE (1) TABLET BY MOUTH EVERY DAY 90 tablet 1  . memantine (NAMENDA XR) 7 MG CP24 24 hr capsule Take 1 capsule (7 mg total) by mouth daily. 30 capsule 2  . mupirocin ointment (BACTROBAN) 2 % Apply to area twice daily 22 g 0  . nitrofurantoin, macrocrystal-monohydrate, (MACROBID) 100 MG capsule Take 1 capsule (100 mg total) by mouth 2 (two) times daily. 14 capsule 0  . nitroGLYCERIN (NITROSTAT) 0.4 MG SL tablet Place 0.4 mg under the tongue every 5 (five) minutes as needed for chest pain. Reported on 06/01/2015    . NON FORMULARY DNR comfort measures only    .  ranitidine (ZANTAC) 150 MG tablet Take 1 tablet (150 mg total) by mouth 2 (two) times daily. (Patient taking differently: Take 150 mg by mouth at bedtime. ) 30 tablet 5  . senna-docusate (SENOKOT-S) 8.6-50 MG tablet Take 1 tablet by mouth daily as needed for mild constipation.     No current facility-administered medications on file prior to visit.     BP 134/70   Pulse 84   Temp 98 F (36.7 C) (Oral)   Resp 16   SpO2 97%       Objective:   Physical Exam   General- No acute distress. Pleasant patient. Neck- Full range of motion, no jvd Lungs- Clear, even and unlabored. Heart- regular rate and rhythm. Neurologic- CNII- XII grossly intact.  Rt forearm-  The prior abrasion wounds appear to be  healing very well. The moderately deep wounds described on previous exam have now actually filled in. They were 2 small ulcer type lesions previously present she no longer has foul odor as well. No yellow creamy discharge or honey crusting. No warmth or swelling to the forearm either.  Left forearm proximal aspect wound looks better as well.      Assessment & Plan:  The right forearm/wound infection looks a lot better. This is only on topical mupirocin despite the fact that abundant MRSA grew out on the culture.  I want to continue current treatment of topical mupirocin on twice daily and dressing changes twice daily. We'll see if the area continues to improve.  However if over the weekend the wound/abrasion areas worsen as described then please start the clindamycin low dose antibiotic and start to use probiotics as well.  Follow-up as regularly scheduled with Dr. Etter Sjogren on this coming Tuesday or as needed.  Eleyna Brugh, Percell Miller, PA-C

## 2016-11-22 ENCOUNTER — Encounter: Payer: Self-pay | Admitting: Family Medicine

## 2016-11-22 ENCOUNTER — Ambulatory Visit (INDEPENDENT_AMBULATORY_CARE_PROVIDER_SITE_OTHER): Payer: Medicare Other | Admitting: Family Medicine

## 2016-11-22 ENCOUNTER — Telehealth: Payer: Self-pay | Admitting: *Deleted

## 2016-11-22 VITALS — BP 98/60 | HR 68 | Temp 97.7°F | Resp 18 | Ht 64.0 in | Wt 111.4 lb

## 2016-11-22 DIAGNOSIS — F0391 Unspecified dementia with behavioral disturbance: Secondary | ICD-10-CM | POA: Diagnosis not present

## 2016-11-22 DIAGNOSIS — S61502A Unspecified open wound of left wrist, initial encounter: Secondary | ICD-10-CM | POA: Diagnosis not present

## 2016-11-22 DIAGNOSIS — T17308A Unspecified foreign body in larynx causing other injury, initial encounter: Secondary | ICD-10-CM | POA: Diagnosis not present

## 2016-11-22 DIAGNOSIS — S51801A Unspecified open wound of right forearm, initial encounter: Secondary | ICD-10-CM

## 2016-11-22 NOTE — Telephone Encounter (Signed)
Received Physician Orders from [facility]; forwarded to provider/SLS 08/28

## 2016-11-22 NOTE — Assessment & Plan Note (Signed)
Pt on abx Bandage in place rto prn

## 2016-11-22 NOTE — Assessment & Plan Note (Signed)
Skin flap closed and covered  Pt is on abx-- finish abx Change dressing 2x a day rto prn

## 2016-11-22 NOTE — Patient Instructions (Signed)
How to Change Your Dressing A dressing is a material that is placed in and over wounds. A dressing helps your wound to heal by protecting it from bacteria, further injury, and becoming too dry or too wet. What are the risks? The adhesive tape that is used with a dressing may make your skin sore or irritated or cause a rash. These are the most common problems. However, more serious problems can develop, such as:  Bleeding.  Infection.  How to change your dressing How often you change your dressing will depend on your wound. Change the dressing as often as told by your health care provider. Preparing to Change Your Dressing  Take a shower before you do the first dressing change of the day. If your health care provider does not want your wound to get wet and your dressing is not waterproof, you may need to apply plastic leak-proof sealing wrap to your dressing for protection.  If needed, take pain medicine 30 minutes before the dressing change as prescribed by your health care provider.  Set up a clean station for wound care. You will need: ? A disposable garbage bag that is open and ready to use. ? Hand sanitizer. ? Wound cleanser or salt-water solution (saline) as told by your health care provider. ? New dressing material or bandages. Make sure to open the dressing package so the dressing remains on the inside of the package. You may also need the following in your clean station:  A box of vinyl gloves.  Tape.  Skin protectant. This may be a wipe, film, or spray.  Clean or germ-free (sterile) scissors.  A cotton-tipped applicator.  Removing Your Old Dressing  Wash your hands with soap and water. Dry your hands with a clean towel. If soap and water are not available, use hand sanitizer.  If you are using gloves, put the gloves on before you remove the dressing.  Gently remove any adhesive or tape by pulling it off in the direction of your hair growth. Only touch the outside  edges of the dressing.  Take off the dressing. If the dressing sticks to your skin, use a sterile salt-water solution to wet the dressing. This helps it to come off more easily.  Remove any gauze or packing in your wound.  Throw the old dressing supplies into the ready garbage bag.  Remove each glove by grabbing the cuff with the opposite hand and turning the glove inside out. Place the gloves in the trash immediately.  Wash your hands with soap and water. Dry your hands with a clean towel. If soap and water are not available, use hand sanitizer. Cleaning Your Wound  Follow instructions from your health care provider about how to clean your wound. This may include using a saline or recommended wound cleanser.  Do not use over-the-counter medicated or antiseptic creams, sprays, liquids, or dressings unless told to do so by your health care provider.  Use a clean gauze pad to clean the area thoroughly with the recommended saline solution or wound cleanser.  Throw the gauze pad into the garbage bag.  Wash your hands with soap and water. Dry your hands with a clean towel. If soap and water are not available, use hand sanitizer. Applying the Dressing  If your health care provider recommended a skin protectant, apply it to the skin around the wound.  Cover the wound with the recommended dressing, such as a nonstick gauze or bandage. Make sure to touch only the outside  edges of the dressing. Do not touch the inside of the dressing.  Secure the dressing so all sides stay in place. You may do this with the attached medical adhesive, roll gauze, or tape. If you use tape, do not wrap the tape all the way around your arm or leg.  Take off your gloves. Put them in the plastic bag with the old dressing. Tie the bag shut and throw it away.  Wash your hands with soap and water. Dry your hands with a clean towel. If soap and water are not available, use hand sanitizer. Contact a health care provider  if:   You have new pain.  You develop irritation, a rash, or itching around the wound or dressing.  Changing your dressing causes pain or a lot of bleeding. Get help right away if:  You have severe pain.  You have signs of infection, such as: ? More redness, swelling, or pain. ? More fluid or blood. ? Warmth. ? Pus or a bad smell. ? Red streaks leading from wound. ? A fever. This information is not intended to replace advice given to you by your health care provider. Make sure you discuss any questions you have with your health care provider. Document Released: January 29, 202006 Document Revised: 08/12/2015 Document Reviewed: 12/18/2014 Elsevier Interactive Patient Education  [employer].

## 2016-11-22 NOTE — Assessment & Plan Note (Signed)
Pt asking for hospice Hospice released her If she is not

## 2016-11-22 NOTE — Progress Notes (Signed)
Patient ID: Virginia Davidson, female    DOB: 04-Oct-1916  Age: 83 y.o. MRN: [id number]    Subjective:  Subjective  HPI Virginia Davidson presents for f/u-- hospice released her ---  Daughter in law and son are really wanting hospice to con't--- pt is now living at home with 24 hour cna care Pt is eating well but sleeps most of the time.  She gets agitated at nigbt and takes haldol which helps .     Review of Systems  Constitutional: Positive for activity change, appetite change and fatigue. Negative for fever and unexpected weight change.  HENT: Negative for congestion.   Respiratory: Negative for cough and shortness of breath.   Cardiovascular: Negative for chest pain, palpitations and leg swelling.  Gastrointestinal: Negative for abdominal pain, blood in stool and nausea.  Genitourinary: Negative for dysuria and frequency.  Skin: Negative for rash.  Allergic/Immunologic: Negative for environmental allergies.  Neurological: Negative for dizziness and headaches.  Psychiatric/Behavioral: Positive for agitation, behavioral problems, confusion, decreased concentration, dysphoric mood and sleep disturbance. The patient is nervous/anxious.     History Past Medical History:  Diagnosis Date  . Abdominal aneurysm ([place])   . Dementia   . Diverticulitis   . Glaucoma   . Hypertension   . Thyroid disease   . Urinary incontinence     She has a past surgical history that includes Bladder surgery (N/A) and Hip surgery (Right).   Her family history includes Stroke in her mother.She reports that she has never smoked. She has never used smokeless tobacco. She reports that she does not drink alcohol or use drugs.  Current Outpatient Prescriptions on File Prior to Visit  Medication Sig Dispense Refill  . ALPRAZolam (XANAX) 0.25 MG tablet TAKE ONE TABLET 3 TIMES A DAY AS NEEDED. 30 tablet 0  . Calcium Carbonate-Vitamin D 600-200 MG-UNIT CAPS Take 1 tablet by mouth 2 (two) times daily.    . clindamycin  (CLEOCIN) 75 MG capsule 1 tablet by mouth 3 times a day 21 capsule 0  . levothyroxine (SYNTHROID, LEVOTHROID) 75 MCG tablet Take 1 tablet (75 mcg total) by mouth daily. 90 tablet 1  . lisinopril (PRINIVIL,ZESTRIL) 10 MG tablet TAKE ONE (1) TABLET BY MOUTH EVERY DAY 90 tablet 1  . memantine (NAMENDA XR) 7 MG CP24 24 hr capsule Take 1 capsule (7 mg total) by mouth daily. 30 capsule 2  . mupirocin ointment (BACTROBAN) 2 % Apply to area twice daily 22 g 2  . nitrofurantoin, macrocrystal-monohydrate, (MACROBID) 100 MG capsule Take 1 capsule (100 mg total) by mouth 2 (two) times daily. 14 capsule 0  . nitroGLYCERIN (NITROSTAT) 0.4 MG SL tablet Place 0.4 mg under the tongue every 5 (five) minutes as needed for chest pain. Reported on 06/01/2015    . NON FORMULARY DNR comfort measures only    . ranitidine (ZANTAC) 150 MG tablet Take 1 tablet (150 mg total) by mouth 2 (two) times daily. (Patient taking differently: Take 150 mg by mouth at bedtime. ) 30 tablet 5  . senna-docusate (SENOKOT-S) 8.6-50 MG tablet Take 1 tablet by mouth daily as needed for mild constipation.     No current facility-administered medications on file prior to visit.      Objective:  Objective  Physical Exam  Constitutional: She is oriented to person, place, and time. She appears well-developed and well-nourished.  HENT:  Head: Normocephalic and atraumatic.  Eyes: Conjunctivae and EOM are normal.  Neck: Normal range of motion. Neck supple. No JVD present.  Carotid bruit is not present. No thyromegaly present.  Cardiovascular: Normal rate, regular rhythm and normal heart sounds.   No murmur heard. Pulmonary/Chest: Effort normal and breath sounds normal. No respiratory distress. She has no wheezes. She has no rales. She exhibits no tenderness.  Musculoskeletal: She exhibits no edema.  Neurological: She is alert and oriented to person, place, and time.  Skin: Laceration noted.     Psychiatric: She has a normal mood and affect.    Nursing note and vitals reviewed.  BP 98/60 (BP Location: Left Arm, Patient Position: Sitting, Cuff Size: Normal)   Pulse 68   Temp 97.7 F (36.5 C) (Oral)   Resp 18   Ht 5\' 4"  (1.626 m)   Wt 111 lb 6.4 oz (50.5 kg)   SpO2 98%   BMI 19.12 kg/m  Wt Readings from Last 3 Encounters:  11/22/16 111 lb 6.4 oz (50.5 kg)  03/23/16 95 lb (43.1 kg)  02/27/16 95 lb (43.1 kg)     Lab Results  Component Value Date   WBC 8.8 10/19/2015   HGB 13.8 10/19/2015   HCT 43.0 10/19/2015   PLT 206 10/19/2015   GLUCOSE 106 (H) 02/08/2016   CHOL 166 02/08/2016   TRIG 94.0 02/08/2016   HDL 51.60 02/08/2016   LDLCALC 96 02/08/2016   ALT 11 02/08/2016   AST 14 02/08/2016   NA 141 02/08/2016   K 4.3 02/08/2016   CL 106 02/08/2016   CREATININE 0.93 02/08/2016   BUN 24 (H) 02/08/2016   CO2 31 02/08/2016   TSH 0.46 02/08/2016   INR 1.21 05/23/2015    Dg Chest 2 View  Result Date: 03/23/2016 CLINICAL DATA:  Patient's daughter in law states that patient keeps aspirating and her cough has increased, has had a cough and congestion since 02/20/16, hx of dementia, HTN, thyroid disease, no other complaints EXAM: CHEST  2 VIEW COMPARISON:  03/02/2016 FINDINGS: Cardiac silhouette is mildly enlarged. There is a moderate size hiatal hernia, stable. No mediastinal or hilar masses. No convincing adenopathy. Clear lungs.  No pleural effusion.  No pneumothorax. Chronic wedge-shaped compression deformity of a lower thoracic vertebra. Bony thorax is diffusely demineralized. IMPRESSION: 1. No acute cardiopulmonary disease. Electronically Signed   By: Lajean Manes M.D.   On: 03/23/2016 15:54     Assessment & Plan:  Plan  I am having [name] maintain her Calcium Carbonate-Vitamin D, nitroGLYCERIN, ranitidine, senna-docusate, NON FORMULARY, nitrofurantoin (macrocrystal-monohydrate), levothyroxine, memantine, lisinopril, ALPRAZolam, clindamycin, and mupirocin ointment.  No orders of the defined types were  placed in this encounter.   Problem List Items Addressed This Visit      Unprioritized   Dementia   Relevant Orders   Ambulatory referral to Hospice   Ambulatory referral to Speech Therapy   Open wound of left wrist    Skin flap closed and covered  Pt is on abx-- finish abx Change dressing 2x a day rto prn       Open wound of right forearm    Pt on abx Bandage in place rto prn        Other Visit Diagnoses    Choking, initial encounter    -  Primary   Relevant Orders   Ambulatory referral to Hospice   Ambulatory referral to Speech Therapy      Follow-up: Return if symptoms worsen or fail to improve.  Ann Held, DO

## 2016-11-25 ENCOUNTER — Other Ambulatory Visit: Payer: Self-pay | Admitting: Family Medicine

## 2016-11-25 ENCOUNTER — Other Ambulatory Visit: Payer: Self-pay

## 2016-11-25 DIAGNOSIS — R1013 Epigastric pain: Secondary | ICD-10-CM

## 2016-11-25 DIAGNOSIS — F411 Generalized anxiety disorder: Secondary | ICD-10-CM

## 2016-11-25 MED ORDER — ALPRAZOLAM 0.25 MG PO TABS: ORAL_TABLET | ORAL | 0 refills | Status: DC

## 2016-11-25 MED ORDER — RANITIDINE HCL 150 MG PO TABS: 150.0000 mg | ORAL_TABLET | Freq: Two times a day (BID) | ORAL | 5 refills | Status: DC

## 2016-11-25 NOTE — Telephone Encounter (Signed)
Received a paper refill request for pt's Alprazolam 0.25.    Last OV 11/22/16 ( believe this was acute)  Last filled 11/15/16 by Percell Miller QTY:30 RF:0      Sig: TAKE ONE TABLET 3 TIMES A DAY AS NEEDED.

## 2016-11-25 NOTE — Telephone Encounter (Signed)
Spoke with Sam the pharmacist and rx was called in. Nothing further is needed

## 2016-11-29 ENCOUNTER — Telehealth: Payer: Self-pay | Admitting: Family Medicine

## 2016-11-29 DIAGNOSIS — T17308A Unspecified foreign body in larynx causing other injury, initial encounter: Secondary | ICD-10-CM

## 2016-11-29 NOTE — Telephone Encounter (Signed)
Called daughter in law.  She was under the impression that the swallow study was order for hospice to complete. She was informed that the referral was put in for speech therapist to complete.  She apologized for the confusion.  Daughter in law would prefer that study be completed wither tomorrow morning around 10 or 11 am or Thursday.  Fridays are not good days due to transportation.  Jen---will you help with this?  Referral has already been placed.

## 2016-11-29 NOTE — Telephone Encounter (Signed)
Ok -- then we just need to get swallow study done

## 2016-11-29 NOTE — Telephone Encounter (Signed)
[place] and spoke to [place]. Lesleigh Noe was aware of the family's request for Hospice to be reinstated.  Per Lesleigh Noe the family agreed that they would reevaluate patient for hospice once the swallowing eval results were back, and Dr. Zelphia Cairo (hospice doctor) returned on 9/10 to complete face to face visit (which is required by Medicare).   According to [facility], the patient no longer qualified for hospice due to her not meeting the Medicare criteria for Alzheimer's as her diagnosis. Per Medicare, pt has to be in the FAST Stage 7 in order to qualify.  Abnormal weight loss was also used, but since then patient's weight has stabilized. The family did make [place] aware of the choking episode that patient experience, and they were under the impression that it only happened once.  Per Lesleigh Noe, they had no heard back from family since 11/23/16 and wasn't aware of any changes.  Lesleigh Noe stated she offered the family on 11/23/16 to send out another provider to complete the face to face visit, but the family declined. Stating they would like to wait for Dr. Zelphia Cairo since she was most family with the patient.  Lesleigh Noe stated she would call the family today (11/29/16) to see if they would like for them to send out someone else before the 10 th to complete a face to face visit.  Lesleigh Noe was made aware that patient was seen by Dr. Etter Sjogren on 11/22/16, however, Lesleigh Noe stated it has to be completed by the hospice provider.

## 2016-11-29 NOTE — Telephone Encounter (Signed)
Pt's daughter in law Shauna Hugh) [phone] says that pt is choking a lot, she would like to know if PCP could order a choking MRI?    Please advise.

## 2016-11-29 NOTE — Telephone Encounter (Signed)
Virginia Davidson  Please Advise-  Daughter and law states that this patient is still choking a lot and feels like it is getting worse.She states hospice will not see the pt and she does not think a speech pathologist would help. She is requesting some type of imaging.

## 2016-11-29 NOTE — Telephone Encounter (Signed)
Ananya, Mccleese (daughter in law) [phone] states insurance will not cover swallow study if Rx is sent to hospice and would like order placed with specialist directly, daughter in law would like to discuss please advise Diane directly

## 2016-11-29 NOTE — Telephone Encounter (Signed)
We ordered swallow study-- was that not scheduled?   I had Virginia Davidson looking into hospice to see if maybe we could do palliative care instead of hospice since hospice declined

## 2016-11-30 NOTE — Telephone Encounter (Signed)
Virginia Davidson from Speech Therapy calling requesting order for Modified Barium Swallow, order # [id number]. Can order please be placed

## 2016-11-30 NOTE — Telephone Encounter (Signed)
Spoke with [facility], they are calling family to schedule

## 2016-12-01 ENCOUNTER — Other Ambulatory Visit (HOSPITAL_COMMUNITY): Payer: Self-pay | Admitting: Family Medicine

## 2016-12-01 DIAGNOSIS — R1319 Other dysphagia: Secondary | ICD-10-CM

## 2016-12-01 NOTE — Telephone Encounter (Signed)
Order entered

## 2016-12-05 ENCOUNTER — Other Ambulatory Visit (HOSPITAL_COMMUNITY): Payer: Medicare Other

## 2016-12-05 ENCOUNTER — Ambulatory Visit (HOSPITAL_COMMUNITY): Payer: Medicare Other

## 2016-12-09 ENCOUNTER — Ambulatory Visit (HOSPITAL_COMMUNITY)
Admission: RE | Admit: 2016-12-09 | Discharge: 2016-12-09 | Disposition: A | Discharge status: Home / Self Care | Payer: Medicare Other | Source: Ambulatory Visit | Attending: Family Medicine | Admitting: Family Medicine

## 2016-12-09 DIAGNOSIS — R1319 Other dysphagia: Secondary | ICD-10-CM

## 2016-12-09 DIAGNOSIS — R1312 Dysphagia, oropharyngeal phase: Secondary | ICD-10-CM | POA: Diagnosis not present

## 2016-12-09 DIAGNOSIS — T17308A Unspecified foreign body in larynx causing other injury, initial encounter: Secondary | ICD-10-CM

## 2016-12-09 DIAGNOSIS — R05 Cough: Secondary | ICD-10-CM | POA: Diagnosis not present

## 2016-12-12 ENCOUNTER — Telehealth: Payer: Self-pay | Admitting: Family Medicine

## 2016-12-12 NOTE — Telephone Encounter (Signed)
See Result notes 

## 2016-12-12 NOTE — Telephone Encounter (Signed)
Virginia Davidson was returning a call about his mothers results, requesting that we call back to Keiona Jenison his wife who is listed on DPR at [phone]

## 2016-12-16 DIAGNOSIS — H10023 Other mucopurulent conjunctivitis, bilateral: Secondary | ICD-10-CM | POA: Diagnosis not present

## 2016-12-18 DIAGNOSIS — Z23 Encounter for immunization: Secondary | ICD-10-CM | POA: Diagnosis not present

## 2016-12-19 ENCOUNTER — Telehealth: Payer: Self-pay | Admitting: Medical

## 2016-12-19 ENCOUNTER — Other Ambulatory Visit: Payer: Self-pay | Admitting: Family Medicine

## 2016-12-19 DIAGNOSIS — G309 Alzheimer's disease, unspecified: Secondary | ICD-10-CM

## 2016-12-19 DIAGNOSIS — F0280 Dementia in other diseases classified elsewhere without behavioral disturbance: Secondary | ICD-10-CM

## 2016-12-19 DIAGNOSIS — T148XXA Other injury of unspecified body region, initial encounter: Secondary | ICD-10-CM

## 2016-12-19 DIAGNOSIS — R1013 Epigastric pain: Secondary | ICD-10-CM

## 2016-12-19 DIAGNOSIS — B3749 Other urogenital candidiasis: Secondary | ICD-10-CM

## 2016-12-19 DIAGNOSIS — L089 Local infection of the skin and subcutaneous tissue, unspecified: Secondary | ICD-10-CM

## 2016-12-19 MED ORDER — NITROFURANTOIN MONOHYD MACRO 100 MG PO CAPS
100.0000 mg | ORAL_CAPSULE | Freq: Two times a day (BID) | ORAL | 0 refills | Status: DC
Start: 2016-12-19 — End: 2017-01-19

## 2016-12-19 MED ORDER — LEVOTHYROXINE SODIUM 75 MCG PO TABS: 75.0000 ug | ORAL_TABLET | Freq: Every day | ORAL | 1 refills | Status: DC

## 2016-12-19 MED ORDER — MEMANTINE HCL ER 7 MG PO CP24: 7.0000 mg | ORAL_CAPSULE | Freq: Every day | ORAL | 2 refills | Status: DC

## 2016-12-19 MED ORDER — NITROGLYCERIN 0.4 MG SL SUBL: 0.4000 mg | SUBLINGUAL_TABLET | SUBLINGUAL | 0 refills | Status: DC | PRN

## 2016-12-19 MED ORDER — MUPIROCIN 2 % EX OINT: TOPICAL_OINTMENT | CUTANEOUS | 2 refills | Status: DC

## 2016-12-19 MED ORDER — HALOPERIDOL 0.5 MG PO TABS: 0.5000 mg | ORAL_TABLET | Freq: Three times a day (TID) | ORAL | 0 refills | Status: DC

## 2016-12-19 MED ORDER — LISINOPRIL 10 MG PO TABS: ORAL_TABLET | ORAL | 1 refills | Status: DC

## 2016-12-19 MED ORDER — RANITIDINE HCL 150 MG PO TABS: 150.0000 mg | ORAL_TABLET | Freq: Two times a day (BID) | ORAL | 5 refills | Status: DC

## 2016-12-19 MED ORDER — SENNOSIDES-DOCUSATE SODIUM 8.6-50 MG PO TABS: 1.0000 | ORAL_TABLET | Freq: Every day | ORAL | 0 refills | Status: DC | PRN

## 2016-12-19 MED ORDER — CALCIUM CARBONATE-VITAMIN D 600-200 MG-UNIT PO CAPS: 1.0000 | ORAL_CAPSULE | Freq: Two times a day (BID) | ORAL | 0 refills | Status: DC

## 2016-12-19 MED ORDER — CLINDAMYCIN HCL 75 MG PO CAPS: ORAL_CAPSULE | ORAL | 0 refills | Status: DC

## 2016-12-19 NOTE — Telephone Encounter (Signed)
Please call daughter --   We have not written haldol -- who wrote it for her

## 2016-12-19 NOTE — Telephone Encounter (Signed)
HALDOL needs called to [facility] in [place] on [address] because the other [facility] on [address] said their computers are down.

## 2016-12-19 NOTE — Telephone Encounter (Signed)
LVM informing that We have not written haldol -- who wrote it for her

## 2016-12-19 NOTE — Telephone Encounter (Signed)
I don't see where the patient is on Haldol.

## 2016-12-19 NOTE — Telephone Encounter (Signed)
Pt daughter called states [facility] at [address] is the only one that has Haldol in stock right now. Please call script Haldol there. She states all other scripts need refilled at [place]. Daughter ph [phone].

## 2016-12-20 NOTE — Telephone Encounter (Signed)
Virginia Davidson states she needs to req script for Linzess lowest dosage. She states it is a stool softer and Carollee Herter has never called it in before. Please call in to [facility] per daughter in law Virginia Davidson's request.

## 2016-12-21 NOTE — Telephone Encounter (Signed)
Advise on Linzess request

## 2016-12-21 NOTE — Telephone Encounter (Signed)
linzess is not a stool softener  Has she tried miralax/ benefiber together daily?

## 2016-12-22 NOTE — Telephone Encounter (Signed)
Called left message to call back 

## 2016-12-23 NOTE — Telephone Encounter (Signed)
Called the daughter informed of PCP instructions. She verbalized understanding and stated they would do as PCP instructed/but will call back if it does not work. They have been doing a stool softener daily, but not miralax., but will do

## 2016-12-27 ENCOUNTER — Other Ambulatory Visit: Payer: Self-pay | Admitting: Family Medicine

## 2016-12-27 ENCOUNTER — Telehealth: Payer: Self-pay | Admitting: Family Medicine

## 2016-12-27 MED ORDER — HALOPERIDOL 0.5 MG PO TABS: 0.5000 mg | ORAL_TABLET | Freq: Three times a day (TID) | ORAL | 0 refills | Status: DC

## 2016-12-27 NOTE — Telephone Encounter (Signed)
Daughter is calling, requesting script be sent to Virginia Davidson, patient is almost out of meds. Requesting call once sent to [phone]

## 2016-12-27 NOTE — Telephone Encounter (Signed)
Faxed to [facility] to daughter/thx dmf

## 2016-12-27 NOTE — Telephone Encounter (Signed)
Caller name: Sam Relation to pt: Pharmacist Call back number: pharmacist direct tel [phone] Pharmacy: [facility]  Reason for call: Sam needs to speak with nurse regarding about rx sent to pharmacy,  haloperidol (HALDOL) 0.5 MG tablet. Please advise ASAP.

## 2016-12-27 NOTE — Telephone Encounter (Signed)
[facility] faxed in a refill of the Haloperidol 0.5mg  tid and the pharmacist called stating that they do not make a tablet at that dosage  He said that could do the liquid form of 2mg  per 21mL 0.28mL tid? Is this ok? Plz advise/thx dmf

## 2016-12-27 NOTE — Telephone Encounter (Signed)
Ok to switch to the liquid as suggested by pharmacist just let patient know

## 2016-12-28 MED ORDER — HALOPERIDOL LACTATE 2 MG/ML PO CONC: ORAL | 0 refills | Status: DC

## 2016-12-28 NOTE — Telephone Encounter (Signed)
Xylan Sheils/C Haloperidol 0.5mg  (unavailable) Start Haloperidol sol 2mg /31mL 0.62mL tid #75mL faxed/thx dmf

## 2017-01-03 NOTE — Telephone Encounter (Signed)
Virginia Davidson called to see why there is no refills on this medication, she really needs there to be refills, so that she does not have to call every month to get this medication.

## 2017-01-04 ENCOUNTER — Other Ambulatory Visit: Payer: Self-pay | Admitting: Family Medicine

## 2017-01-19 ENCOUNTER — Ambulatory Visit (INDEPENDENT_AMBULATORY_CARE_PROVIDER_SITE_OTHER): Payer: Medicare Other | Admitting: Medical

## 2017-01-19 ENCOUNTER — Encounter: Payer: Self-pay | Admitting: Medical

## 2017-01-19 ENCOUNTER — Telehealth: Payer: Self-pay | Admitting: Medical

## 2017-01-19 ENCOUNTER — Ambulatory Visit (HOSPITAL_BASED_OUTPATIENT_CLINIC_OR_DEPARTMENT_OTHER)
Admission: RE | Admit: 2017-01-19 | Discharge: 2017-01-19 | Disposition: A | Discharge status: Home / Self Care | Payer: Medicare Other | Source: Ambulatory Visit | Attending: Medical | Admitting: Medical

## 2017-01-19 ENCOUNTER — Telehealth: Payer: Self-pay | Admitting: Family Medicine

## 2017-01-19 VITALS — BP 145/66 | HR 62 | Temp 97.5°F | Resp 16 | Ht 64.0 in | Wt 115.6 lb

## 2017-01-19 DIAGNOSIS — R05 Cough: Secondary | ICD-10-CM

## 2017-01-19 DIAGNOSIS — Z7409 Other reduced mobility: Secondary | ICD-10-CM | POA: Diagnosis not present

## 2017-01-19 DIAGNOSIS — Z8701 Personal history of pneumonia (recurrent): Secondary | ICD-10-CM

## 2017-01-19 DIAGNOSIS — R5383 Other fatigue: Secondary | ICD-10-CM | POA: Diagnosis not present

## 2017-01-19 DIAGNOSIS — F0391 Unspecified dementia with behavioral disturbance: Secondary | ICD-10-CM

## 2017-01-19 DIAGNOSIS — B351 Tinea unguium: Secondary | ICD-10-CM

## 2017-01-19 DIAGNOSIS — F0390 Unspecified dementia without behavioral disturbance: Secondary | ICD-10-CM

## 2017-01-19 DIAGNOSIS — R059 Cough, unspecified: Secondary | ICD-10-CM

## 2017-01-19 MED ORDER — CLINDAMYCIN HCL 75 MG PO CAPS: 75.0000 mg | ORAL_CAPSULE | Freq: Three times a day (TID) | ORAL | 0 refills | Status: DC

## 2017-01-19 MED ORDER — CICLOPIROX 8 % EX SOLN: Freq: Every day | CUTANEOUS | 0 refills | Status: DC

## 2017-01-19 NOTE — Telephone Encounter (Signed)
Patient Name: Virginia Davidson DOB: 03/24/1917 Initial Comment Caller states her mother has a cold and wondering if she needs to bring her in. No fever but sounds rattley and dtr is concerned about pneumonia. Nurse Assessment Nurse: Holly Bodily, RN, [place] Date/Time (Eastern Time): 01/19/2017 12:08:10 PM Confirm and document reason for call. If symptomatic, describe symptoms. ---Caller has no fever, very congested and having "rattley" breath sounds. Caller states he has had an off and on cough for three days. Seems to be more after meals. Caller states cough is nonproductive because she is not strong enough to cough it up. Does the patient have any new or worsening symptoms? ---Yes Will a triage be completed? ---Yes Related visit to physician within the last 2 weeks? ---No Does the PT have any chronic conditions? (i.e. diabetes, asthma, etc.) ---Yes List chronic conditions. ---Dementia, HTN Is this a behavioral health or substance abuse call? ---No Guidelines Guideline Title Affirmed Question Affirmed Notes Cough - Acute Non-Productive Taking an ACE Inhibitor medication (e.g., benazepril/LOTENSIN, captopril/ CAPOTEN, enalapril/VASOTEC, lisinopril/ ZESTRIL) Final Disposition User See PCP When Office is Open (within 3 days) [place], Therapist, sports, [employer] Comments Appt with PA Saquier at the [employer] office today at 2:15 Referrals REFERRED TO PCP OFFICE Caller Disagree/Comply Comply Caller Understands Yes PreDisposition Did not know what to do

## 2017-01-19 NOTE — Telephone Encounter (Signed)
Dr. Etter Sjogren,  I saw pt of your today. Daughter in law was requesting a hospital bed. Pt has dementia, 83 year old, poor mobility and spends most of day in bed per daughter in law(she lives at daughter in law home). What would be best way to get  Hospital bed(specifically she as for 2/3 bed). Is this along the lines of getting power chair or easier? Transferring pt is difficult for family and for staff that watches her all day. How would you approach. Write rx with diagnosis. Advise to present to medical device store. Or is referral for in home occupation therapy/PT necessary to evaluate bed/difficulty transferring etc.  Thanks, Percell Miller

## 2017-01-19 NOTE — Progress Notes (Signed)
Subjective:    Patient ID: Virginia Davidson, female    DOB: [birth year], 83 y.o.   MRN: [id number]  HPI  Pt in with 5-6 days of some chest congestion and cough. Pt daughter in law gave her otc cough medicine. Pt has hx of dementia. Early in week she sounded little rattled per daughter in law.  Pt stays at home. She has sitters who have to help her but she is a difficult transfer. She has hard time ambulating. She walks short distance about 10 steps only.   Daughter in law thinks she is more fatigued than normal.   Daughter in law request that we write prescription for hospital bed. The sitters for patient are requesting hospital bed.(pt states told by person she know the 3/4 bed would be better).  Hx of pneumonia aspiration in past.    Review of Systems  Constitutional: Positive for fatigue. Negative for chills and fever.  HENT: Negative for congestion, ear pain, mouth sores, postnasal drip, sinus pain and sinus pressure.   Respiratory: Positive for cough. Negative for chest tightness, shortness of breath and wheezing.   Cardiovascular: Negative for chest pain and palpitations.  Gastrointestinal: Negative for abdominal pain, blood in stool, diarrhea and vomiting.  Musculoskeletal: Negative for back pain, joint swelling and myalgias.  Skin: Negative for rash.  Neurological: Negative for dizziness, seizures, syncope, weakness and headaches.  Hematological: Negative for adenopathy. Does not bruise/bleed easily.  Psychiatric/Behavioral: Negative for behavioral problems and decreased concentration.     Past Medical History:  Diagnosis Date  . Abdominal aneurysm ([place])   . Dementia   . Diverticulitis   . Glaucoma   . Hypertension   . Thyroid disease   . Urinary incontinence      Social History   Social History  . Marital status: Widowed    Spouse name: N/A  . Number of children: N/A  . Years of education: N/A   Occupational History  . Not on file.   Social History Main  Topics  . Smoking status: Never Smoker  . Smokeless tobacco: Never Used  . Alcohol use No  . Drug use: No  . Sexual activity: Not Currently   Other Topics Concern  . Not on file   Social History Narrative   Walking -- exercise    Past Surgical History:  Procedure Laterality Date  . BLADDER SURGERY N/A    x2  . HIP SURGERY Right     Family History  Problem Relation Age of Onset  . Stroke Mother     Allergies  Allergen Reactions  . Penicillins Rash    Family is not sure of reaction as far as breathing issues are concerned  . Sulfamethoxazole Rash  . Alendronate Sodium     Unknown Reaction per caregiver  . Tetracyclines & Related     Unknown Reaction per caregiver    Current Outpatient Prescriptions on File Prior to Visit  Medication Sig Dispense Refill  . ALPRAZolam (XANAX) 0.25 MG tablet TAKE ONE TABLET 3 TIMES A DAY AS NEEDED. 30 tablet 0  . Calcium Carbonate-Vitamin D 600-200 MG-UNIT CAPS Take 1 tablet by mouth 2 (two) times daily. 60 each 0  . clindamycin (CLEOCIN) 75 MG capsule 1 tablet by mouth 3 times a day 21 capsule 0  . haloperidol (HALDOL) 2 MG/ML solution TAKE 0.25ML BY MOUTH 3 TIMES DAILY 30 mL 0  . levothyroxine (SYNTHROID, LEVOTHROID) 75 MCG tablet Take 1 tablet (75 mcg total) by mouth daily. 90 tablet  1  . lisinopril (PRINIVIL,ZESTRIL) 10 MG tablet TAKE ONE (1) TABLET BY MOUTH EVERY DAY 90 tablet 1  . memantine (NAMENDA XR) 7 MG CP24 24 hr capsule Take 1 capsule (7 mg total) by mouth daily. 30 capsule 2  . mupirocin ointment (BACTROBAN) 2 % Apply to area twice daily 22 g 2  . nitrofurantoin, macrocrystal-monohydrate, (MACROBID) 100 MG capsule Take 1 capsule (100 mg total) by mouth 2 (two) times daily. 14 capsule 0  . nitroGLYCERIN (NITROSTAT) 0.4 MG SL tablet Place 1 tablet (0.4 mg total) under the tongue every 5 (five) minutes as needed for chest pain. Reported on 06/01/2015 30 tablet 0  . NON FORMULARY DNR comfort measures only    . ranitidine (ZANTAC)  150 MG tablet Take 1 tablet (150 mg total) by mouth 2 (two) times daily. 30 tablet 5  . senna-docusate (SENOKOT-S) 8.6-50 MG tablet Take 1 tablet by mouth daily as needed for mild constipation. 30 tablet 0   No current facility-administered medications on file prior to visit.     BP (!) 145/66   Pulse 62   Temp (!) 97.5 F (36.4 C) (Oral)   Resp 16   Ht 5\' 4"  (1.626 m)   Wt 115 lb 9.6 oz (52.4 kg)   SpO2 100%   BMI 19.84 kg/m       Objective:   Physical Exam  General  Mental Status - Alert. General Appearance - Well groomed. Not in acute distress.  Skin Rashes- No Rashes.  HEENT Head- Normal. Ear Auditory Canal - Left- Normal. Right - Normal.Tympanic Membrane- Left- Normal. Right- Normal. Eye Sclera/Conjunctiva- Left- Normal. Right- Normal. Nose & Sinuses Nasal Mucosa- Left-  Boggy and Congested. Right-  Boggy and  Congested.Bilateral maxillary and frontal sinus pressure. Mouth & Throat Lips: Upper Lip- Normal: no dryness, cracking, pallor, cyanosis, or vesicular eruption. Lower Lip-Normal: no dryness, cracking, pallor, cyanosis or vesicular eruption. Buccal Mucosa- Bilateral- No Aphthous ulcers. Oropharynx- No Discharge or Erythema. Tonsils: Characteristics- Bilateral- No Erythema or Congestion. Size/Enlargement- Bilateral- No enlargement. Discharge- bilateral-None.  Neck Neck- Supple. No Masses.   Chest and Lung Exam Auscultation: Breath Sounds:- even and unlabored. But shallow. Very faint possible coarse breath sounds at base of lungs.  Cardiovascular Auscultation:Rythm- Regular, rate and rhythm. Murmurs & Other Heart Sounds:Ausculatation of the heart reveal- No Murmurs.  Lymphatic Head & Neck General Head & Neck Lymphatics: Bilateral: Description- No Localized lymphadenopathy.  Lower ext- no pedal edama. But ankles are both swollen. Negative homans signs.   Hands- bilateral thick yellow discolored nails of hand.       Assessment & Plan:  With  history of dementia and history of aspiration, I do think it is best to get chest x-ray today and get CBC.  During the interview the cough does sound a little wet.  Will prescribe low-dose clindamycin.  You can continue the over-the-counter cough syrup.  She does appear to have discolored nails and likely fungal infection.  I wrote prescription for Penlac.  I will discuss with Dr. Etter Sjogren the need for hospital bed and see if we just need to write the prescription or if it would be beneficial to get occupational therapist/or physical therapist opinion regarding difficulty of transfers.  Follow-up in 7 days or as needed.  Coral Timme, Percell Miller, PA-C

## 2017-01-19 NOTE — Patient Instructions (Signed)
With history of dementia and history of aspiration, I do think it is best to get chest x-ray today and get CBC.  During the interview the cough does sound a little wet.  Will prescribe low-dose clindamycin.  You can continue the over-the-counter cough syrup.  She does appear to have discolored nails and likely fungal infection.  I wrote prescription for Penlac.  I will discuss with Dr. Etter Sjogren the need for hospital bed and see if we just need to write the prescription or if it would be beneficial to get occupational therapist/or physical therapist opinion regarding difficulty of transfers.  Follow-up in 7 days or as needed.

## 2017-01-19 NOTE — Telephone Encounter (Signed)
I think she already has home health--- if that is the case jasmine can just fax rx for bed to them with dx on it--- if they d/c her -- she may needs referral for home health  Has the daughter already spoken to someone about a bed.

## 2017-01-20 LAB — CBC WITH DIFFERENTIAL/PLATELET
BASOS ABS: 0.1 10*3/uL (ref 0.0–0.1)
BASOS PCT: 1.5 % (ref 0.0–3.0)
Eosinophils Absolute: 0.2 10*3/uL (ref 0.0–0.7)
Eosinophils Relative: 3.2 % (ref 0.0–5.0)
HEMATOCRIT: 44.2 % (ref 36.0–46.0)
HEMOGLOBIN: 14.6 g/dL (ref 12.0–15.0)
LYMPHS ABS: 1.1 10*3/uL (ref 0.7–4.0)
Lymphocytes Relative: 15 % (ref 12.0–46.0)
MCHC: 32.9 g/dL (ref 30.0–36.0)
MCV: 94.7 fl (ref 78.0–100.0)
MONO ABS: 1.1 10*3/uL — AB (ref 0.1–1.0)
Monocytes Relative: 15.2 % — ABNORMAL HIGH (ref 3.0–12.0)
NEUTROS ABS: 4.7 10*3/uL (ref 1.4–7.7)
Neutrophils Relative %: 65.1 % (ref 43.0–77.0)
Platelets: 202 10*3/uL (ref 150.0–400.0)
RBC: 4.67 Mil/uL (ref 3.87–5.11)
RDW: 15.3 % (ref 11.5–15.5)
WBC: 7.2 10*3/uL (ref 4.0–10.5)

## 2017-01-23 NOTE — Telephone Encounter (Signed)
Placed order for hospital bed

## 2017-01-23 NOTE — Telephone Encounter (Addendum)
Will use to send over that fax regarding order for her bed.  Or have patients daughter-in-law pick it up.  Call her on Wednesday for updates on progress to getting the hospital bed.  Also double check to see if we need to order home health again.

## 2017-01-23 NOTE — Addendum Note (Signed)
Addended by: Hinton Dyer on: 01/23/2017 09:48 AM   Modules accepted: Orders

## 2017-01-24 ENCOUNTER — Telehealth: Payer: Self-pay

## 2017-01-24 NOTE — Telephone Encounter (Signed)
Pt's daughter notified.

## 2017-01-24 NOTE — Telephone Encounter (Signed)
It is over the counter. .I believe pediatric dosing 4-81 yr old is 2.5 ml every 12 hours(if she bought 12 hour version?). Double check on side of box/bottle. Lower dose to avoid side effects and see if it gives some symptoms relief.

## 2017-01-24 NOTE — Telephone Encounter (Signed)
Pt's daughter bought delsym cough syrup and wants to know how much can she give pt at one time.

## 2017-01-24 NOTE — Telephone Encounter (Signed)
So 2.5 ml every 12 hours for delsym. Notify pt.

## 2017-01-24 NOTE — Telephone Encounter (Signed)
Yes It's 12 hours

## 2017-01-25 ENCOUNTER — Other Ambulatory Visit: Payer: Self-pay | Admitting: Family Medicine

## 2017-01-26 ENCOUNTER — Telehealth: Payer: Self-pay | Admitting: *Deleted

## 2017-01-26 ENCOUNTER — Ambulatory Visit: Payer: Medicare Other | Admitting: Medical

## 2017-01-26 ENCOUNTER — Encounter: Payer: Self-pay | Admitting: Medical

## 2017-01-26 NOTE — Telephone Encounter (Signed)
Received Physician Orders from [facility]; forwarded to provider/SLS 11/01  

## 2017-01-27 ENCOUNTER — Telehealth: Payer: Self-pay | Admitting: Medical

## 2017-01-27 ENCOUNTER — Telehealth: Payer: Self-pay | Admitting: *Deleted

## 2017-01-27 NOTE — Telephone Encounter (Signed)
Rx was sent to pharmacy. 

## 2017-01-27 NOTE — Telephone Encounter (Signed)
Would you make sure haldol sent to pt pharmacy. I got my chart request to refill and it looks like Dr Charlett Blake filled. Also daughter in law has concern that pt may have a uti. I would recommend office visit today with someone if possible or at least get urine sample and culture sent out. That way results of culture will be back by Monday to direct treatment.

## 2017-01-27 NOTE — Telephone Encounter (Signed)
rx was sent to pharmacy

## 2017-01-27 NOTE — Telephone Encounter (Signed)
Received Physician Orders from [facility] [progress note for the Evaluation of Need for [place]; forwarded to provider/SLS 11/02

## 2017-01-30 ENCOUNTER — Encounter: Payer: Self-pay | Admitting: Family Medicine

## 2017-01-30 ENCOUNTER — Ambulatory Visit (INDEPENDENT_AMBULATORY_CARE_PROVIDER_SITE_OTHER): Payer: Medicare Other | Admitting: Family Medicine

## 2017-01-30 VITALS — BP 129/67 | HR 69 | Temp 98.0°F | Resp 16

## 2017-01-30 DIAGNOSIS — N898 Other specified noninflammatory disorders of vagina: Secondary | ICD-10-CM | POA: Insufficient documentation

## 2017-01-30 DIAGNOSIS — R2230 Localized swelling, mass and lump, unspecified upper limb: Secondary | ICD-10-CM | POA: Diagnosis not present

## 2017-01-30 DIAGNOSIS — M546 Pain in thoracic spine: Secondary | ICD-10-CM

## 2017-01-30 MED ORDER — TRAMADOL HCL 50 MG PO TABS: 50.0000 mg | ORAL_TABLET | Freq: Three times a day (TID) | ORAL | 0 refills | Status: DC | PRN

## 2017-01-30 MED ORDER — KETOROLAC TROMETHAMINE 60 MG/2ML IM SOLN
60.0000 mg | Freq: Once | INTRAMUSCULAR | Status: AC
  Administered 2017-01-30: 60 mg via INTRAMUSCULAR

## 2017-01-30 NOTE — Assessment & Plan Note (Signed)
Check urine ancillary  And urine culture

## 2017-01-30 NOTE — Patient Instructions (Signed)

## 2017-01-30 NOTE — Progress Notes (Signed)
Patient ID: Hortence Charter, female   DOB: 31-Jan-1917, 83 y.o.   MRN: [id number]     Subjective:  I acted as a Education administrator for Dr. Carollee Herter.  Guerry Bruin, [place]   Patient ID: Netty Sullivant, female    DOB: Jan 27, 1917, 83 y.o.   MRN: [id number]  Chief Complaint  Patient presents with  . Cough    doing better  . hand swelling    left hand noticed this morning  . vaginal odor    would like estrogen cream    HPI  Patient is in today for follow up bronchitis, left hand swelling and vaginal odor.  She is initially in for follow up cough, she is doing better.  Her daughter noticed her left hand was swollen this morning.  She has been having vaginal odor no matter how much they wash and daughter would like to know if estrogen cream may work.  Patient Care Team: Carollee Herter, Alferd Apa, DO as PCP - General (Family Medicine) Hale Bogus., MD as Referring Physician (Gastroenterology) Debby Bud., MD (Surgery) Luellen Pucker, MD (Obstetrics and Gynecology) Christy Sartorius, MD as Referring Physician (Urology) Altamese Benton, MD as Consulting Physician (Orthopedic Surgery) Marilynne Halsted, MD as Referring Physician (Ophthalmology)   Past Medical History:  Diagnosis Date  . Abdominal aneurysm ([place])   . Dementia   . Diverticulitis   . Glaucoma   . Hypertension   . Thyroid disease   . Urinary incontinence     Past Surgical History:  Procedure Laterality Date  . BLADDER SURGERY N/A    x2  . HIP SURGERY Right     Family History  Problem Relation Age of Onset  . Stroke Mother     Social History   Socioeconomic History  . Marital status: Widowed    Spouse name: Not on file  . Number of children: Not on file  . Years of education: Not on file  . Highest education level: Not on file  Social Needs  . Financial resource strain: Not on file  . Food insecurity - worry: Not on file  . Food insecurity - inability: Not on file  . Transportation needs - medical: Not on file    . Transportation needs - non-medical: Not on file  Occupational History  . Not on file  Tobacco Use  . Smoking status: Never Smoker  . Smokeless tobacco: Never Used  Substance and Sexual Activity  . Alcohol use: No  . Drug use: No  . Sexual activity: Not Currently  Other Topics Concern  . Not on file  Social History Narrative   Walking -- exercise    Outpatient Medications Prior to Visit  Medication Sig Dispense Refill  . ALPRAZolam (XANAX) 0.25 MG tablet TAKE ONE TABLET 3 TIMES A DAY AS NEEDED. 30 tablet 0  . Calcium Carbonate-Vitamin D 600-200 MG-UNIT CAPS Take 1 tablet by mouth 2 (two) times daily. 60 each 0  . ciclopirox (PENLAC) 8 % solution Apply topically at bedtime. Apply over nail and surrounding skin. Apply daily over previous coat. After seven (7) days, may remove with alcohol and continue cycle. 6.6 mL 0  . clindamycin (CLEOCIN) 75 MG capsule 1 tablet by mouth 3 times a day 21 capsule 0  . clindamycin (CLEOCIN) 75 MG capsule Take 1 capsule (75 mg total) by mouth 3 (three) times daily. 21 capsule 0  . haloperidol (HALDOL) 2 MG/ML solution TAKE 0.25ML BY MOUTH 3 TIMES DAILY 30 mL 0  . levothyroxine (SYNTHROID,  LEVOTHROID) 75 MCG tablet Take 1 tablet (75 mcg total) by mouth daily. 90 tablet 1  . lisinopril (PRINIVIL,ZESTRIL) 10 MG tablet TAKE ONE (1) TABLET BY MOUTH EVERY DAY 90 tablet 1  . mupirocin ointment (BACTROBAN) 2 % Apply to area twice daily 22 g 2  . nitroGLYCERIN (NITROSTAT) 0.4 MG SL tablet Place 1 tablet (0.4 mg total) under the tongue every 5 (five) minutes as needed for chest pain. Reported on 06/01/2015 30 tablet 0  . NON FORMULARY DNR comfort measures only    . ranitidine (ZANTAC) 150 MG tablet Take 1 tablet (150 mg total) by mouth 2 (two) times daily. 30 tablet 5  . senna-docusate (SENOKOT-S) 8.6-50 MG tablet Take 1 tablet by mouth daily as needed for mild constipation. 30 tablet 0  . memantine (NAMENDA XR) 7 MG CP24 24 hr capsule Take 1 capsule (7 mg total)  by mouth daily. 30 capsule 2   No facility-administered medications prior to visit.     Allergies  Allergen Reactions  . Penicillins Rash    Family is not sure of reaction as far as breathing issues are concerned  . Sulfamethoxazole Rash  . Alendronate Sodium     Unknown Reaction per caregiver  . Tetracyclines & Related     Unknown Reaction per caregiver    Review of Systems  Constitutional: Positive for malaise/fatigue. Negative for chills and fever.  HENT: Negative for congestion and hearing loss.   Eyes: Negative for discharge.  Respiratory: Positive for cough. Negative for sputum production and shortness of breath.   Cardiovascular: Negative for chest pain, palpitations and leg swelling.  Gastrointestinal: Negative for abdominal pain, blood in stool, constipation, diarrhea, heartburn, nausea and vomiting.  Genitourinary: Negative for dysuria, frequency, hematuria and urgency.  Musculoskeletal: Negative for back pain, falls and myalgias.  Skin: Negative for rash.  Neurological: Positive for weakness. Negative for dizziness, sensory change, loss of consciousness and headaches.  Endo/Heme/Allergies: Negative for environmental allergies. Does not bruise/bleed easily.  Psychiatric/Behavioral: Positive for memory loss. Negative for depression and suicidal ideas. The patient is not nervous/anxious and does not have insomnia.        Objective:    Physical Exam  Constitutional: She is oriented to person, place, and time. She appears well-developed and well-nourished.  HENT:  Head: Normocephalic and atraumatic.  Eyes: Conjunctivae and EOM are normal.  Neck: Normal range of motion. Neck supple. No JVD present. Carotid bruit is not present. No thyromegaly present.  Cardiovascular: Normal rate, regular rhythm and normal heart sounds.  No murmur heard. Pulmonary/Chest: Effort normal and breath sounds normal. No respiratory distress. She has no wheezes. She has no rales. She exhibits  no tenderness.  + wet cough--- pt unable to cough up mucus  Musculoskeletal: She exhibits edema.       Arms: Neurological: She is alert and oriented to person, place, and time.  Psychiatric: She has a normal mood and affect.  Nursing note and vitals reviewed.   BP 129/67 (BP Location: Right Arm, Cuff Size: Normal)   Pulse 69   Temp 98 F (36.7 C) (Oral)   Resp 16   SpO2 93%  Wt Readings from Last 3 Encounters:  01/19/17 115 lb 9.6 oz (52.4 kg)  11/22/16 111 lb 6.4 oz (50.5 kg)  03/23/16 95 lb (43.1 kg)   BP Readings from Last 3 Encounters:  01/30/17 129/67  01/19/17 (!) 145/66  11/22/16 98/60     Immunization History  Administered Date(s) Administered  . Influenza, High  Dose Seasonal PF 01/05/2015  . Influenza-Unspecified 01/08/2016    Health Maintenance  Topic Date Due  . TETANUS/TDAP  04/21/1937  . DEXA SCAN  04/22/1983  . PNA vac Low Risk Adult (1 of 2 - PCV13) 04/22/1983  . INFLUENZA VACCINE  10/26/2016    Lab Results  Component Value Date   WBC 7.2 01/19/2017   HGB 14.6 01/19/2017   HCT 44.2 01/19/2017   PLT 202.0 01/19/2017   GLUCOSE 106 (H) 02/08/2016   CHOL 166 02/08/2016   TRIG 94.0 02/08/2016   HDL 51.60 02/08/2016   LDLCALC 96 02/08/2016   ALT 11 02/08/2016   AST 14 02/08/2016   NA 141 02/08/2016   K 4.3 02/08/2016   CL 106 02/08/2016   CREATININE 0.93 02/08/2016   BUN 24 (H) 02/08/2016   CO2 31 02/08/2016   TSH 0.46 02/08/2016   INR 1.21 05/23/2015    Lab Results  Component Value Date   TSH 0.46 02/08/2016   Lab Results  Component Value Date   WBC 7.2 01/19/2017   HGB 14.6 01/19/2017   HCT 44.2 01/19/2017   MCV 94.7 01/19/2017   PLT 202.0 01/19/2017   Lab Results  Component Value Date   NA 141 02/08/2016   K 4.3 02/08/2016   CO2 31 02/08/2016   GLUCOSE 106 (H) 02/08/2016   BUN 24 (H) 02/08/2016   CREATININE 0.93 02/08/2016   BILITOT 0.6 02/08/2016   ALKPHOS 63 02/08/2016   AST 14 02/08/2016   ALT 11 02/08/2016   PROT  5.9 (L) 02/08/2016   ALBUMIN 3.6 02/08/2016   CALCIUM 9.9 02/08/2016   ANIONGAP 6 10/19/2015   GFR 59.19 (L) 02/08/2016   Lab Results  Component Value Date   CHOL 166 02/08/2016   Lab Results  Component Value Date   HDL 51.60 02/08/2016   Lab Results  Component Value Date   LDLCALC 96 02/08/2016   Lab Results  Component Value Date   TRIG 94.0 02/08/2016   Lab Results  Component Value Date   CHOLHDL 3 02/08/2016   No results found for: HGBA1C       Assessment & Plan:   Problem List Items Addressed This Visit      Unprioritized   Vaginal odor    Check urine ancillary  And urine culture        Other Visit Diagnoses    Localized swelling on hand    -  Primary   Relevant Orders   DG Hand Complete Left   Thoracic back pain, unspecified back pain laterality, unspecified chronicity       Relevant Medications   traMADol (ULTRAM) 50 MG tablet   ketorolac (TORADOL) injection 60 mg (Completed)      I have discontinued Melida Dovel's memantine. I am also having her start on traMADol. Additionally, I am having her maintain her NON FORMULARY, ALPRAZolam, Calcium Carbonate-Vitamin D, nitroGLYCERIN, senna-docusate, ranitidine, mupirocin ointment, levothyroxine, lisinopril, clindamycin, clindamycin, ciclopirox, and haloperidol. We administered ketorolac.  Meds ordered this encounter  Medications  . traMADol (ULTRAM) 50 MG tablet    Sig: Take 1 tablet (50 mg total) every 8 (eight) hours as needed by mouth.    Dispense:  30 tablet    Refill:  0  . ketorolac (TORADOL) injection 60 mg    CMA served as scribe during this visit. History, Physical and Plan performed by medical provider. Documentation and orders reviewed and attested to.  Ann Held, DO

## 2017-02-07 ENCOUNTER — Other Ambulatory Visit (HOSPITAL_COMMUNITY)
Admission: RE | Admit: 2017-02-07 | Discharge: 2017-02-07 | Disposition: A | Discharge status: Home / Self Care | Payer: Medicare Other | Source: Ambulatory Visit | Attending: Family Medicine | Admitting: Family Medicine

## 2017-02-07 ENCOUNTER — Other Ambulatory Visit (INDEPENDENT_AMBULATORY_CARE_PROVIDER_SITE_OTHER): Payer: Medicare Other

## 2017-02-07 DIAGNOSIS — N390 Urinary tract infection, site not specified: Secondary | ICD-10-CM | POA: Insufficient documentation

## 2017-02-07 DIAGNOSIS — N898 Other specified noninflammatory disorders of vagina: Secondary | ICD-10-CM | POA: Insufficient documentation

## 2017-02-07 DIAGNOSIS — R82998 Other abnormal findings in urine: Secondary | ICD-10-CM | POA: Insufficient documentation

## 2017-02-07 DIAGNOSIS — R8290 Unspecified abnormal findings in urine: Secondary | ICD-10-CM | POA: Insufficient documentation

## 2017-02-07 DIAGNOSIS — R319 Hematuria, unspecified: Secondary | ICD-10-CM

## 2017-02-07 LAB — POC URINALSYSI DIPSTICK (AUTOMATED)
BILIRUBIN UA: NEGATIVE
GLUCOSE UA: NEGATIVE
Ketones, UA: NEGATIVE
Nitrite, UA: POSITIVE
Protein, UA: NEGATIVE
SPEC GRAV UA: 1.025 (ref 1.010–1.025)
UROBILINOGEN UA: 0.2 U/dL
pH, UA: 6 (ref 5.0–8.0)

## 2017-02-09 ENCOUNTER — Telehealth: Payer: Self-pay | Admitting: *Deleted

## 2017-02-09 ENCOUNTER — Other Ambulatory Visit: Payer: Self-pay | Admitting: *Deleted

## 2017-02-09 DIAGNOSIS — R319 Hematuria, unspecified: Principal | ICD-10-CM

## 2017-02-09 DIAGNOSIS — N390 Urinary tract infection, site not specified: Secondary | ICD-10-CM

## 2017-02-09 MED ORDER — CIPROFLOXACIN HCL 250 MG PO TABS: 250.0000 mg | ORAL_TABLET | Freq: Two times a day (BID) | ORAL | 0 refills | Status: DC

## 2017-02-09 NOTE — Telephone Encounter (Signed)
Daughter in law wanted to ask you about putting patient on macrobid daily.  Cipro sent in.

## 2017-02-09 NOTE — Telephone Encounter (Signed)
-----   Message from Ann Held, DO sent at 02/07/2017  9:25 PM EST ----- Looks like she has a uti----  cipro 250 mg bid x 5 days  We are waiting for culture and wet prep Recheck urine and culture in 2 weeks--- daughter in law can pick up a urine cup and drop off again

## 2017-02-09 NOTE — Telephone Encounter (Signed)
Trimethoprim 100mg  qhs daily

## 2017-02-10 LAB — URINE CULTURE
MICRO NUMBER:: 81283864
SPECIMEN QUALITY:: ADEQUATE

## 2017-02-10 LAB — URINE CYTOLOGY ANCILLARY ONLY
BACTERIAL VAGINITIS: NEGATIVE
Candida vaginitis: NEGATIVE

## 2017-02-13 MED ORDER — TRIMETHOPRIM 100 MG PO TABS: 100.0000 mg | ORAL_TABLET | Freq: Every day | ORAL | 5 refills | Status: DC

## 2017-02-13 NOTE — Telephone Encounter (Signed)
rx sent in and left message on machine

## 2017-03-03 NOTE — Telephone Encounter (Signed)
I thought we gave her an extra urine cup to recheck urine--- if she is having watery stools- we may need to check stool culture and c dif

## 2017-03-03 NOTE — Telephone Encounter (Signed)
Pt's daughter in law Diane called in to be advised. She said that since starting antibiotic pt has had diarrhea really bad. She has been giving pt Am odium but says that she's not sure what to do. She would also like to have something called in to pharmacy for a UTI for pt.    Please call back to advise.     CB: [phone] - Diane

## 2017-03-03 NOTE — Telephone Encounter (Signed)
Patient daughter in law will call and make appt at Saturday clinic

## 2017-03-06 ENCOUNTER — Inpatient Hospital Stay (HOSPITAL_BASED_OUTPATIENT_CLINIC_OR_DEPARTMENT_OTHER)
Admission: EM | Admit: 2017-03-06 | Discharge: 2017-03-08 | DRG: 872 | Disposition: A | Discharge status: Hospice | Payer: Medicare Other | Attending: Internal Medicine | Admitting: Internal Medicine

## 2017-03-06 ENCOUNTER — Emergency Department (HOSPITAL_BASED_OUTPATIENT_CLINIC_OR_DEPARTMENT_OTHER): Payer: Medicare Other

## 2017-03-06 ENCOUNTER — Encounter (HOSPITAL_BASED_OUTPATIENT_CLINIC_OR_DEPARTMENT_OTHER): Payer: Self-pay | Admitting: *Deleted

## 2017-03-06 ENCOUNTER — Other Ambulatory Visit: Payer: Self-pay

## 2017-03-06 DIAGNOSIS — Z79899 Other long term (current) drug therapy: Secondary | ICD-10-CM

## 2017-03-06 DIAGNOSIS — F419 Anxiety disorder, unspecified: Secondary | ICD-10-CM | POA: Diagnosis present

## 2017-03-06 DIAGNOSIS — Z515 Encounter for palliative care: Secondary | ICD-10-CM | POA: Diagnosis present

## 2017-03-06 DIAGNOSIS — F0390 Unspecified dementia without behavioral disturbance: Secondary | ICD-10-CM | POA: Diagnosis not present

## 2017-03-06 DIAGNOSIS — R059 Cough, unspecified: Secondary | ICD-10-CM | POA: Diagnosis present

## 2017-03-06 DIAGNOSIS — R739 Hyperglycemia, unspecified: Secondary | ICD-10-CM | POA: Diagnosis present

## 2017-03-06 DIAGNOSIS — Z7189 Other specified counseling: Secondary | ICD-10-CM

## 2017-03-06 DIAGNOSIS — E039 Hypothyroidism, unspecified: Secondary | ICD-10-CM | POA: Diagnosis present

## 2017-03-06 DIAGNOSIS — A419 Sepsis, unspecified organism: Principal | ICD-10-CM | POA: Diagnosis present

## 2017-03-06 DIAGNOSIS — N179 Acute kidney failure, unspecified: Secondary | ICD-10-CM | POA: Diagnosis present

## 2017-03-06 DIAGNOSIS — R197 Diarrhea, unspecified: Secondary | ICD-10-CM | POA: Diagnosis present

## 2017-03-06 DIAGNOSIS — R4182 Altered mental status, unspecified: Secondary | ICD-10-CM | POA: Diagnosis not present

## 2017-03-06 DIAGNOSIS — E870 Hyperosmolality and hypernatremia: Secondary | ICD-10-CM | POA: Diagnosis present

## 2017-03-06 DIAGNOSIS — I714 Abdominal aortic aneurysm, without rupture: Secondary | ICD-10-CM | POA: Diagnosis present

## 2017-03-06 DIAGNOSIS — Z66 Do not resuscitate: Secondary | ICD-10-CM | POA: Diagnosis present

## 2017-03-06 DIAGNOSIS — N390 Urinary tract infection, site not specified: Secondary | ICD-10-CM | POA: Diagnosis present

## 2017-03-06 DIAGNOSIS — E860 Dehydration: Secondary | ICD-10-CM | POA: Diagnosis present

## 2017-03-06 DIAGNOSIS — Z87440 Personal history of urinary (tract) infections: Secondary | ICD-10-CM | POA: Diagnosis not present

## 2017-03-06 DIAGNOSIS — H409 Unspecified glaucoma: Secondary | ICD-10-CM | POA: Diagnosis present

## 2017-03-06 DIAGNOSIS — I10 Essential (primary) hypertension: Secondary | ICD-10-CM | POA: Diagnosis present

## 2017-03-06 DIAGNOSIS — R05 Cough: Secondary | ICD-10-CM | POA: Diagnosis present

## 2017-03-06 DIAGNOSIS — L8990 Pressure ulcer of unspecified site, unspecified stage: Secondary | ICD-10-CM | POA: Insufficient documentation

## 2017-03-06 DIAGNOSIS — J189 Pneumonia, unspecified organism: Secondary | ICD-10-CM

## 2017-03-06 LAB — I-STAT VENOUS BLOOD GAS, ED
Acid-base deficit: 4 mmol/L — ABNORMAL HIGH (ref 0.0–2.0)
Bicarbonate: 21.7 mmol/L (ref 20.0–28.0)
O2 SAT: 72 %
TCO2: 23 mmol/L (ref 22–32)
pCO2, Ven: 42.4 mmHg — ABNORMAL LOW (ref 44.0–60.0)
pH, Ven: 7.322 (ref 7.250–7.430)
pO2, Ven: 44 mmHg (ref 32.0–45.0)

## 2017-03-06 LAB — CBC WITH DIFFERENTIAL/PLATELET
BASOS PCT: 0 %
Basophils Absolute: 0 10*3/uL (ref 0.0–0.1)
EOS PCT: 0 %
Eosinophils Absolute: 0 10*3/uL (ref 0.0–0.7)
HCT: 48 % — ABNORMAL HIGH (ref 36.0–46.0)
Hemoglobin: 15.6 g/dL — ABNORMAL HIGH (ref 12.0–15.0)
Lymphocytes Relative: 2 %
Lymphs Abs: 0.6 10*3/uL — ABNORMAL LOW (ref 0.7–4.0)
MCH: 30.7 pg (ref 26.0–34.0)
MCHC: 32.5 g/dL (ref 30.0–36.0)
MCV: 94.5 fL (ref 78.0–100.0)
MONO ABS: 2.5 10*3/uL — AB (ref 0.1–1.0)
Monocytes Relative: 9 %
NEUTROS ABS: 24.7 10*3/uL — AB (ref 1.7–7.7)
NEUTROS PCT: 89 %
PLATELETS: 180 10*3/uL (ref 150–400)
RBC: 5.08 MIL/uL (ref 3.87–5.11)
RDW: 15.4 % (ref 11.5–15.5)
WBC: 27.8 10*3/uL — ABNORMAL HIGH (ref 4.0–10.5)

## 2017-03-06 LAB — COMPREHENSIVE METABOLIC PANEL
ALK PHOS: 79 U/L (ref 38–126)
ALT: 13 U/L — AB (ref 14–54)
AST: 20 U/L (ref 15–41)
Albumin: 3.3 g/dL — ABNORMAL LOW (ref 3.5–5.0)
Anion gap: 8 (ref 5–15)
BUN: 60 mg/dL — ABNORMAL HIGH (ref 6–20)
CALCIUM: 10.1 mg/dL (ref 8.9–10.3)
CHLORIDE: 119 mmol/L — AB (ref 101–111)
CO2: 21 mmol/L — ABNORMAL LOW (ref 22–32)
CREATININE: 1.55 mg/dL — AB (ref 0.44–1.00)
GFR, EST AFRICAN AMERICAN: 31 mL/min — AB (ref >60)
GFR, EST NON AFRICAN AMERICAN: 27 mL/min — AB (ref >60)
Glucose, Bld: 225 mg/dL — ABNORMAL HIGH (ref 65–99)
Potassium: 4.4 mmol/L (ref 3.5–5.1)
Sodium: 148 mmol/L — ABNORMAL HIGH (ref 135–145)
Total Bilirubin: 0.6 mg/dL (ref 0.3–1.2)
Total Protein: 6.1 g/dL — ABNORMAL LOW (ref 6.5–8.1)

## 2017-03-06 LAB — URINALYSIS, MICROSCOPIC (REFLEX)

## 2017-03-06 LAB — URINALYSIS, ROUTINE W REFLEX MICROSCOPIC
BILIRUBIN URINE: NEGATIVE
Glucose, UA: NEGATIVE mg/dL
KETONES UR: NEGATIVE mg/dL
NITRITE: NEGATIVE
PROTEIN: NEGATIVE mg/dL
Specific Gravity, Urine: 1.025 (ref 1.005–1.030)
pH: 5.5 (ref 5.0–8.0)

## 2017-03-06 LAB — I-STAT CG4 LACTIC ACID, ED: LACTIC ACID, VENOUS: 2.42 mmol/L — AB (ref 0.5–1.9)

## 2017-03-06 MED ORDER — SODIUM CHLORIDE 0.9 % IV BOLUS (SEPSIS)
250.0000 mL | Freq: Once | INTRAVENOUS | Status: AC
  Administered 2017-03-06: 250 mL via INTRAVENOUS

## 2017-03-06 MED ORDER — SODIUM CHLORIDE 0.9 % IV BOLUS (SEPSIS)
1000.0000 mL | Freq: Once | INTRAVENOUS | Status: AC
  Administered 2017-03-06: 1000 mL via INTRAVENOUS

## 2017-03-06 MED ORDER — ONDANSETRON HCL 4 MG/2ML IJ SOLN
4.0000 mg | Freq: Once | INTRAMUSCULAR | Status: AC
  Administered 2017-03-06: 4 mg via INTRAVENOUS
  Filled 2017-03-06: qty 2

## 2017-03-06 MED ORDER — SODIUM CHLORIDE 0.9 % IV BOLUS (SEPSIS)
500.0000 mL | Freq: Once | INTRAVENOUS | Status: AC
  Administered 2017-03-06: 500 mL via INTRAVENOUS

## 2017-03-06 MED ORDER — MORPHINE SULFATE (PF) 2 MG/ML IV SOLN
2.0000 mg | Freq: Once | INTRAVENOUS | Status: AC
  Administered 2017-03-06: 2 mg via INTRAVENOUS
  Filled 2017-03-06: qty 1

## 2017-03-06 NOTE — ED Notes (Signed)
PT taken off bipap and placed on a [place]

## 2017-03-06 NOTE — ED Notes (Signed)
ED Provider at bedside. 

## 2017-03-06 NOTE — ED Notes (Signed)
[facility] arrived to transport pt to [facility]

## 2017-03-06 NOTE — ED Notes (Signed)
Contacted [facility] for transport - [facility] had to bump patient due to more critical transports.

## 2017-03-06 NOTE — ED Notes (Signed)
Patient transported to [place] 

## 2017-03-06 NOTE — ED Provider Notes (Addendum)
[facility] EMERGENCY DEPARTMENT Provider Note   CSN: [id number] Arrival date & time: 03/06/17  1931     History   Chief Complaint Chief Complaint  Patient presents with  . Altered Mental Status    HPI Virginia Davidson is a 83 y.o. female.  Patient is a 83 year old female with a history of abdominal aneurysm, dementia, hypertension, recurrent UTIs who lives with her family at home presenting today with worsening cough, congestion, altered mental status that is worsened throughout the day.  Patient's daughter-in-law states that approximately 2 weeks ago she had a cold with upper respiratory congestion which initially got better.  She was found last week to have a urinary tract infection and was placed on Cipro however she started to develop severe diarrhea for 3-4 days and 2 days ago daughter-in-law took her off of the Cipro.  She seemed to be okay yesterday did not cough at all last night but then this morning started coughing which escalated throughout the day.  She did eat some today but was more confused than normal.  She did have one small episode of diarrhea but it is improved from prior.  She did say that her abdomen hurt earlier today but she has had no nausea or vomiting.  She has been under hospice care in the past but has been discharged from hospice.  She is a DNR but family feels that she would want IV antibiotics and fluids but no intubation or CPR.   The history is provided by the patient.  Altered Mental Status   This is a new problem. The current episode started 12 to 24 hours ago. The problem has been gradually worsening. Associated symptoms include confusion and weakness. Her past medical history is significant for hypertension and dementia.    Past Medical History:  Diagnosis Date  . Abdominal aneurysm ([place])   . Dementia   . Diverticulitis   . Glaucoma   . Hypertension   . Thyroid disease   . Urinary incontinence     Patient Active Problem List   Diagnosis Date Noted  . Vaginal odor 01/30/2017  . Open wound of left wrist 11/22/2016  . Open wound of right forearm 11/22/2016  . Cough 06/04/2015  . Urinary retention 05/25/2015  . Influenza A (H1N1) 05/24/2015  . Sepsis ([place]) 05/23/2015  . Acute respiratory failure ([place]) 05/23/2015  . HCAP (healthcare-associated pneumonia) 05/23/2015  . Intertrochanteric fracture of right hip S/P ORIF and IM screw 11/29/2013  . Essential hypertension, benign 11/29/2013  . Hypothyroidism 11/29/2013  . GERD (gastroesophageal reflux disease) 11/29/2013  . Dementia 11/29/2013  . Anxiety 11/29/2013  . Acute blood loss anemia 11/29/2013  . UTI (urinary tract infection) 11/29/2013  . Unspecified constipation 11/29/2013    Past Surgical History:  Procedure Laterality Date  . BLADDER SURGERY N/A    x2  . HIP SURGERY Right     OB History    No data available       Home Medications    Prior to Admission medications   Medication Sig Start Date End Date Taking? Authorizing Provider  ALPRAZolam Duanne Moron) 0.25 MG tablet TAKE ONE TABLET 3 TIMES A DAY AS NEEDED. 11/25/16   Carollee Herter, Alferd Apa, DO  Calcium Carbonate-Vitamin D 600-200 MG-UNIT CAPS Take 1 tablet by mouth 2 (two) times daily. 12/19/16   Ann Held, DO  ciclopirox (PENLAC) 8 % solution Apply topically at bedtime. Apply over nail and surrounding skin. Apply daily over previous coat. After seven (  7) days, may remove with alcohol and continue cycle. 01/19/17   Saguier, Percell Miller, PA-C  ciprofloxacin (CIPRO) 250 MG tablet Take 1 tablet (250 mg total) 2 (two) times daily by mouth. For 5 days 02/09/17   Carollee Herter, Alferd Apa, DO  clindamycin (CLEOCIN) 75 MG capsule 1 tablet by mouth 3 times a day 12/19/16   Carollee Herter, Alferd Apa, DO  clindamycin (CLEOCIN) 75 MG capsule Take 1 capsule (75 mg total) by mouth 3 (three) times daily. 01/19/17   Saguier, Percell Miller, PA-C  haloperidol (HALDOL) 2 MG/ML solution TAKE 0.25ML BY MOUTH 3 TIMES DAILY 01/26/17    Mosie Lukes, MD  levothyroxine (SYNTHROID, LEVOTHROID) 75 MCG tablet Take 1 tablet (75 mcg total) by mouth daily. 12/19/16   Roma Schanz R, DO  lisinopril (PRINIVIL,ZESTRIL) 10 MG tablet TAKE ONE (1) TABLET BY MOUTH EVERY DAY 12/19/16   Carollee Herter, Alferd Apa, DO  mupirocin ointment (BACTROBAN) 2 % Apply to area twice daily 12/19/16   Carollee Herter, Alferd Apa, DO  nitroGLYCERIN (NITROSTAT) 0.4 MG SL tablet Place 1 tablet (0.4 mg total) under the tongue every 5 (five) minutes as needed for chest pain. Reported on 06/01/2015 12/19/16   Ann Held, DO  NON FORMULARY DNR comfort measures only    [provider]  ranitidine (ZANTAC) 150 MG tablet Take 1 tablet (150 mg total) by mouth 2 (two) times daily. 12/19/16   Roma Schanz R, DO  senna-docusate (SENOKOT-S) 8.6-50 MG tablet Take 1 tablet by mouth daily as needed for mild constipation. 12/19/16   Ann Held, DO  traMADol (ULTRAM) 50 MG tablet Take 1 tablet (50 mg total) every 8 (eight) hours as needed by mouth. 01/30/17   Ann Held, DO  trimethoprim (TRIMPEX) 100 MG tablet Take 1 tablet (100 mg total) daily by mouth. 02/13/17   Carollee Herter, Alferd Apa, DO    Family History Family History  Problem Relation Age of Onset  . Stroke Mother     Social History Social History   Tobacco Use  . Smoking status: Never Smoker  . Smokeless tobacco: Never Used  Substance Use Topics  . Alcohol use: No  . Drug use: No     Allergies   Penicillins; Sulfamethoxazole; Alendronate sodium; and Tetracyclines & related   Review of Systems Review of Systems  Neurological: Positive for weakness.  Psychiatric/Behavioral: Positive for confusion.  All other systems reviewed and are negative.    Physical Exam Updated Vital Signs BP 107/78   Pulse (!) 109   Temp (!) 100.8 F (38.2 C) (Rectal)   SpO2 95%   Physical Exam  Constitutional: She appears well-developed. She appears lethargic. She appears  cachectic. She has a sickly appearance. She appears ill.  HENT:  Mouth/Throat: Mucous membranes are dry.  Eyes: Conjunctivae and EOM are normal. Pupils are equal, round, and reactive to light.  Neck: Neck supple.  Cardiovascular: Normal heart sounds and intact distal pulses. Tachycardia present.  Pulmonary/Chest: Accessory muscle usage present. Tachypnea noted. She has rhonchi.  Diffuse rhonchi  Abdominal: Soft. There is no tenderness. There is no guarding.  Musculoskeletal: She exhibits no edema or tenderness.  Neurological: She appears lethargic.  Patient will respond when you say her name and will briefly open her eyes.  She can follow simple commands such as opening her mouth.  She does move upper and lower extremities bilaterally  Skin: Skin is warm. Capillary refill takes more than 3 seconds.  ED Treatments / Results  Labs (all labs ordered are listed, but only abnormal results are displayed) Labs Reviewed  CBC WITH DIFFERENTIAL/PLATELET - Abnormal; Notable for the following components:      Result Value   WBC 27.8 (*)    Hemoglobin 15.6 (*)    HCT 48.0 (*)    Neutro Abs 24.7 (*)    Lymphs Abs 0.6 (*)    Monocytes Absolute 2.5 (*)    All other components within normal limits  COMPREHENSIVE METABOLIC PANEL - Abnormal; Notable for the following components:   Sodium 148 (*)    Chloride 119 (*)    CO2 21 (*)    Glucose, Bld 225 (*)    BUN 60 (*)    Creatinine, Ser 1.55 (*)    Total Protein 6.1 (*)    Albumin 3.3 (*)    ALT 13 (*)    GFR calc non Af Amer 27 (*)    GFR calc Af Amer 31 (*)    All other components within normal limits  URINALYSIS, ROUTINE W REFLEX MICROSCOPIC - Abnormal; Notable for the following components:   APPearance TURBID (*)    Hgb urine dipstick LARGE (*)    Leukocytes, UA MODERATE (*)    All other components within normal limits  URINALYSIS, MICROSCOPIC (REFLEX) - Abnormal; Notable for the following components:   Bacteria, UA FEW (*)     Squamous Epithelial / LPF 0-5 (*)    All other components within normal limits  I-STAT CG4 LACTIC ACID, ED - Abnormal; Notable for the following components:   Lactic Acid, Venous 2.42 (*)    All other components within normal limits  I-STAT VENOUS BLOOD GAS, ED - Abnormal; Notable for the following components:   pCO2, Ven 42.4 (*)    Acid-base deficit 4.0 (*)    All other components within normal limits  CULTURE, BLOOD (ROUTINE X 2)  CULTURE, BLOOD (ROUTINE X 2)  C DIFFICILE QUICK SCREEN W PCR REFLEX    EKG  EKG Interpretation  Date/Time:  Monday March 06 2017 19:48:29 EST Ventricular Rate:  109 PR Interval:    QRS Duration: 85 QT Interval:  311 QTC Calculation: 419 R Axis:   1 Text Interpretation:  Sinus tachycardia Borderline low voltage, extremity leads Minimal ST elevation, lateral leads No significant change since last tracing Confirmed by Blanchie Dessert [phone]) on 03/06/2017 7:52:13 PM       Radiology Ct Head Wo Contrast  Result Date: 03/06/2017 CLINICAL DATA:  Altered mental status for 1 day EXAM: CT HEAD WITHOUT CONTRAST TECHNIQUE: Contiguous axial images were obtained from the base of the skull through the vertex without intravenous contrast. COMPARISON:  10/18/2015 FINDINGS: Brain: Diffuse atrophic changes are noted stable from prior exam. Previously seen right-sided subdural hematoma has resolved in the interval. Chronic white matter ischemic changes are noted. No acute hemorrhage or acute infarct is seen. There is a apparent 1.7 cm extra-axial lesion on the right adjacent to the frontal lobe. This likely represents a small meningioma. In retrospect it is stable in appearance from the prior exam. Vascular: No hyperdense vessel or unexpected calcification. Skull: Normal. Negative for fracture or focal lesion. Sinuses/Orbits: No acute finding. Other: None. IMPRESSION: Chronic changes without acute abnormality. Resolution of previously seen right-sided subdural  hematoma. 1.7 cm extra-axial soft tissue lesion adjacent to the right frontal lobe likely representing a small meningioma. Electronically Signed   By: Inez Catalina M.D.   On: 03/06/2017 20:58   Dg Chest  Portable 1 View  Result Date: 03/06/2017 CLINICAL DATA:  Altered mental status and cough with shortness of Breath EXAM: PORTABLE CHEST 1 VIEW COMPARISON:  01/19/2017 FINDINGS: Cardiac shadow is stable. Aortic calcifications are again seen. Hiatal hernia is again noted. The lungs are well-aerated without focal infiltrate or sizable effusion. No acute bony abnormality is noted. IMPRESSION: No acute abnormality seen. Electronically Signed   By: Inez Catalina M.D.   On: 03/06/2017 20:14    Procedures Procedures (including critical care time)  Medications Ordered in ED Medications  sodium chloride 0.9 % bolus 1,000 mL (not administered)    And  sodium chloride 0.9 % bolus 500 mL (not administered)    And  sodium chloride 0.9 % bolus 250 mL (not administered)     Initial Impression / Assessment and Plan / ED Course  I have reviewed the triage vital signs and the nursing notes.  Pertinent labs & imaging results that were available during my care of the patient were reviewed by me and considered in my medical decision making (see chart for details).     Patient presenting with symptoms concerning for sepsis.  Most likely from a respiratory source as she has coarse breath sounds, tachypnea and mild respiratory distress.  She is satting 95% on room air but is tachypneic in the mid 30s.  She is febrile here to 100.8 and mildly tachycardic.  She has no evidence of fluid overload.  She recently had URI symptoms but no evidence of pneumonia.  Also concerned that she may have C. difficile that she had been on antibiotics for a urinary tract infection and was taken off 2 days ago due to diarrhea. Code sepsis orders initiated.  Discussing with the family patient is definitely a DNR but at this point they  think she would want IV fluids and antibiotics.  They are going to discuss with other family members whether this is the case.  Pt started on bipap for comfort  8:59 PM After having a long discussion with the patient's son and reviewing her advanced directives it states that she would not want life prolonging therapy including IV hydration and that was also felt to mean antibiotics.  Labs are consistent with a recurrent urinary tract infection, evidence of sepsis with elevated lactate, acute renal injury, hypernatremia with a sodium of 148 and a new leukocytosis of almost 30,000.  Patient could have C. difficile as well.  Initial chest x-ray is negative for pneumonia but might be delayed given coarse breath sounds tachypnea and hypoxia.  BiPAP was removed due to patient being uncomfortable and pulling at the mask.  She was placed on nasal cannula for comfort but is more tachypneic and intermittently desaturating.  After speaking with the patient's family including a son over the telephone who would talked with his other brothers they agreed that they do not want IV antibiotics at this time.  Patient does not currently have any type of hospice care set up at her home.  Will admit for palate of care consult.  Patient was given morphine for pain and comfort.  9:25 PM All pt's sons agree they only want comfort care at this time.  They do not wish to start antibiotics.  Will admit for comfort care  CRITICAL CARE Performed by: Lasaro Primm Total critical care time: 30 minutes Critical care time was exclusive of separately billable procedures and treating other patients. Critical care was necessary to treat or prevent imminent or life-threatening deterioration. Critical care was  time spent personally by me on the following activities: development of treatment plan with patient and/or surrogate as well as nursing, discussions with consultants, evaluation of patient's response to treatment, examination of  patient, obtaining history from patient or surrogate, ordering and performing treatments and interventions, ordering and review of laboratory studies, ordering and review of radiographic studies, pulse oximetry and re-evaluation of patient's condition.   Final Clinical Impressions(s) / ED Diagnoses   Final diagnoses:  Dehydration  Lower urinary tract infectious disease  AKI (acute kidney injury) ([place])  Diarrhea of presumed infectious origin  Community acquired pneumonia, unspecified laterality  Sepsis, due to unspecified organism [facility])  Hypernatremia    ED Discharge Orders    None       Blanchie Dessert, MD 03/06/17 2126    Blanchie Dessert, MD 03/06/17 2127

## 2017-03-06 NOTE — ED Triage Notes (Signed)
Family states altered mental status and increased cough with SOB x 1 day

## 2017-03-06 NOTE — ED Notes (Signed)
Family at bedside. 

## 2017-03-06 NOTE — ED Notes (Signed)
Antibiotics not ordered/started due to pts living will and families wishes. Pt is DNR.

## 2017-03-06 NOTE — Progress Notes (Signed)
This is a no charge note  Transfer from [facility] per Dr. Maryan Rued  83 year old lady with past medical history of hypertension, hypothyroidism, anxiety, dementia, AAA, diverticulitis, incontinence, SDH, who presents with altered mental status, cough and diarrhea. Patient was treated for UTI with antibiotics recently.  Patient was found to have UTI with positive urinalysis with moderate amount of leukocyte, sepsis with leukocytosis 27.8, tachycardia, tachypnea, temperature 100.8, lactic acid 2.42, hyponatremia with sodium 148, AKI with Cre 1.55 and BUN 60. Chest x-rays negative.  CT-head showed: 1. Resolution of previously seen right-sided subdural hematoma. 2. 1.7 cm extra-axial soft tissue lesion adjacent to the right frontal lobe likely representing a small meningioma.  Per EDP, Dr. Maryan Rued, family wants pt be comfort care only, no IV antibiotics should be given. Pt is admitted to med-surg bed for comfort care only.   Please call manager of [facility] at [phone] when pt arrives to floor   Ivor Costa, MD  [facility] Pager [phone]  If 7PM-7AM, please contact night-coverage [email or web address] Password [email or web address] 03/06/2017, 9:55 PM

## 2017-03-06 NOTE — ED Notes (Signed)
Pt switched to 3L [place] per MD orders

## 2017-03-07 DIAGNOSIS — Z515 Encounter for palliative care: Secondary | ICD-10-CM

## 2017-03-07 DIAGNOSIS — F0390 Unspecified dementia without behavioral disturbance: Secondary | ICD-10-CM

## 2017-03-07 DIAGNOSIS — F419 Anxiety disorder, unspecified: Secondary | ICD-10-CM

## 2017-03-07 DIAGNOSIS — A419 Sepsis, unspecified organism: Principal | ICD-10-CM

## 2017-03-07 DIAGNOSIS — R197 Diarrhea, unspecified: Secondary | ICD-10-CM

## 2017-03-07 MED ORDER — POLYVINYL ALCOHOL 1.4 % OP SOLN
1.0000 [drp] | Freq: Four times a day (QID) | OPHTHALMIC | Status: DC | PRN
  Filled 2017-03-07: qty 15

## 2017-03-07 MED ORDER — BIOTENE DRY MOUTH MT LIQD: 15.0000 mL | OROMUCOSAL | Status: DC | PRN

## 2017-03-07 MED ORDER — LOPERAMIDE HCL 2 MG PO CAPS: 2.0000 mg | ORAL_CAPSULE | ORAL | Status: DC | PRN

## 2017-03-07 MED ORDER — ACETAMINOPHEN 650 MG RE SUPP: 650.0000 mg | Freq: Four times a day (QID) | RECTAL | Status: DC | PRN

## 2017-03-07 MED ORDER — GLYCOPYRROLATE 0.2 MG/ML IJ SOLN: 0.2000 mg | INTRAMUSCULAR | Status: DC | PRN

## 2017-03-07 MED ORDER — HALOPERIDOL 1 MG PO TABS: 0.5000 mg | ORAL_TABLET | ORAL | Status: DC | PRN

## 2017-03-07 MED ORDER — LORAZEPAM 2 MG/ML PO CONC
1.0000 mg | ORAL | Status: DC | PRN
  Administered 2017-03-08: 1 mg via SUBLINGUAL
  Filled 2017-03-07: qty 1

## 2017-03-07 MED ORDER — DIPHENHYDRAMINE HCL 50 MG/ML IJ SOLN: 12.5000 mg | INTRAMUSCULAR | Status: DC | PRN

## 2017-03-07 MED ORDER — LORAZEPAM 2 MG/ML IJ SOLN: 1.0000 mg | INTRAMUSCULAR | Status: DC | PRN

## 2017-03-07 MED ORDER — ACETAMINOPHEN 325 MG PO TABS: 650.0000 mg | ORAL_TABLET | Freq: Four times a day (QID) | ORAL | Status: DC | PRN

## 2017-03-07 MED ORDER — GLYCOPYRROLATE 1 MG PO TABS
1.0000 mg | ORAL_TABLET | ORAL | Status: DC | PRN
  Filled 2017-03-07: qty 1

## 2017-03-07 MED ORDER — MORPHINE SULFATE (PF) 4 MG/ML IV SOLN
1.0000 mg | INTRAVENOUS | Status: DC | PRN
  Administered 2017-03-07 – 2017-03-08 (×2): 1 mg via INTRAVENOUS
  Filled 2017-03-07 (×2): qty 1

## 2017-03-07 MED ORDER — STARCH (THICKENING) PO POWD
ORAL | Status: DC | PRN
  Filled 2017-03-07: qty 227

## 2017-03-07 MED ORDER — LORAZEPAM 1 MG PO TABS: 1.0000 mg | ORAL_TABLET | ORAL | Status: DC | PRN

## 2017-03-07 MED ORDER — HALOPERIDOL LACTATE 2 MG/ML PO CONC
0.5000 mg | ORAL | Status: DC | PRN
  Filled 2017-03-07: qty 0.3

## 2017-03-07 MED ORDER — ONDANSETRON HCL 4 MG/2ML IJ SOLN: 4.0000 mg | Freq: Four times a day (QID) | INTRAMUSCULAR | Status: DC | PRN

## 2017-03-07 MED ORDER — ONDANSETRON 4 MG PO TBDP: 4.0000 mg | ORAL_TABLET | Freq: Four times a day (QID) | ORAL | Status: DC | PRN

## 2017-03-07 MED ORDER — HALOPERIDOL LACTATE 5 MG/ML IJ SOLN: 0.5000 mg | INTRAMUSCULAR | Status: DC | PRN

## 2017-03-07 NOTE — Progress Notes (Signed)
Patient seen and examined, admitted by Dr. Lorin Mercy this morning.  Briefly 83 year old female with abdominal aneurysm, dementia, recurrent UTIs admitted with altered mental status, recurrent UTIs, has been under hospice care in the past but discharged from the hospice. DNR.   BP 94/67 (BP Location: Left Arm)   Pulse 66   Temp 98.2 F (36.8 C) (Axillary)   Resp (!) 32   SpO2 92%   A/p: Agree with Dr Lorin Mercy H&P and plan.  Currently appears to be comfortable, in no distress, non verbal. -Family decided with comfort care measures after discussion with EDP at the [facility] comfort measures, no antibiotics or IV fluids, pain control - will place social work consult for residential hospice   Estill Cotta M.D. [facility] 03/07/2017, 9:35 AM  Pager: [phone]

## 2017-03-07 NOTE — H&P (Signed)
History and Physical    Virginia Davidson ZDG:[id number] DOB: 29-Jul-1916 DOA: 03/06/2017  PCP: Ann Held, DO Patient coming from:  Home - lives with family  Chief Complaint: AMS  HPI: Virginia Davidson is a 83 y.o. female with medical history significant of abdominal aneurysm, dementia, hypertension, recurrent UTIs presenting as a transfer from [facility].  The patient is obtunded and unaccompanied.  History as per Dr. Maryan Rued at [facility]:  Patient is a 83 year old female with a history of abdominal aneurysm, dementia, hypertension, recurrent UTIs who lives with her family at home presenting today with worsening cough, congestion, altered mental status that is worsened throughout the day. Patient's daughter-in-law states that approximately 2 weeks ago she had a cold with upper respiratory congestion which initially got better. She was found last week to have a urinary tract infection and was placed on Cipro however she started to develop severe diarrhea for 3-4 days and 2 days ago daughter-in-law took her off of the Cipro. She seemed to be okay yesterday did not cough at all last night but then this morning started coughing which escalated throughout the day. She did eat some today but was more confused than normal. She did have one small episode of diarrhea but it is improved from prior. She did say that her abdomen hurt earlier today but she has had no nausea or vomiting. She has been under hospice care in the past but has been discharged from hospice. She is a DNR but family feels that she would want IV antibiotics and fluids but no intubation or CPR.  ED Course:  Initially code sepsis but after significant discussion the patient's family was in agreement with comfort care only, no antibiotics.  Review of Systems: Unable to assess  PMH, PSH, FH, and SH reviewed in Epic but unable to confirm with patient  Past Medical History:  Diagnosis Date  . Abdominal aneurysm ([place])   . Dementia   .  Diverticulitis   . Glaucoma   . Hypertension   . Thyroid disease   . Urinary incontinence     Past Surgical History:  Procedure Laterality Date  . BLADDER SURGERY N/A    x2  . HIP SURGERY Right     Social History   Socioeconomic History  . Marital status: Widowed    Spouse name: Not on file  . Number of children: Not on file  . Years of education: Not on file  . Highest education level: Not on file  Social Needs  . Financial resource strain: Not on file  . Food insecurity - worry: Not on file  . Food insecurity - inability: Not on file  . Transportation needs - medical: Not on file  . Transportation needs - non-medical: Not on file  Occupational History  . Not on file  Tobacco Use  . Smoking status: Never Smoker  . Smokeless tobacco: Never Used  Substance and Sexual Activity  . Alcohol use: No  . Drug use: No  . Sexual activity: Not Currently  Other Topics Concern  . Not on file  Social History Narrative   Walking -- exercise    Allergies  Allergen Reactions  . Penicillins Rash    Family is not sure of reaction as far as breathing issues are concerned  . Sulfamethoxazole Rash  . Alendronate Sodium     Unknown Reaction per caregiver  . Tetracyclines & Related     Unknown Reaction per caregiver    Family History  Problem Relation Age  of Onset  . Stroke Mother     Prior to Admission medications   Medication Sig Start Date End Date Taking? Authorizing Provider  ALPRAZolam Duanne Moron) 0.25 MG tablet TAKE ONE TABLET 3 TIMES A DAY AS NEEDED. 11/25/16   Carollee Herter, Alferd Apa, DO  Calcium Carbonate-Vitamin D 600-200 MG-UNIT CAPS Take 1 tablet by mouth 2 (two) times daily. 12/19/16   Ann Held, DO  ciclopirox (PENLAC) 8 % solution Apply topically at bedtime. Apply over nail and surrounding skin. Apply daily over previous coat. After seven (7) days, may remove with alcohol and continue cycle. 01/19/17   Saguier, Percell Miller, PA-C  ciprofloxacin (CIPRO) 250 MG  tablet Take 1 tablet (250 mg total) 2 (two) times daily by mouth. For 5 days 02/09/17   Carollee Herter, Alferd Apa, DO  clindamycin (CLEOCIN) 75 MG capsule 1 tablet by mouth 3 times a day 12/19/16   Carollee Herter, Alferd Apa, DO  clindamycin (CLEOCIN) 75 MG capsule Take 1 capsule (75 mg total) by mouth 3 (three) times daily. 01/19/17   Saguier, Percell Miller, PA-C  haloperidol (HALDOL) 2 MG/ML solution TAKE 0.25ML BY MOUTH 3 TIMES DAILY 01/26/17   Mosie Lukes, MD  levothyroxine (SYNTHROID, LEVOTHROID) 75 MCG tablet Take 1 tablet (75 mcg total) by mouth daily. 12/19/16   Roma Schanz R, DO  lisinopril (PRINIVIL,ZESTRIL) 10 MG tablet TAKE ONE (1) TABLET BY MOUTH EVERY DAY 12/19/16   Carollee Herter, Alferd Apa, DO  mupirocin ointment (BACTROBAN) 2 % Apply to area twice daily 12/19/16   Carollee Herter, Alferd Apa, DO  nitroGLYCERIN (NITROSTAT) 0.4 MG SL tablet Place 1 tablet (0.4 mg total) under the tongue every 5 (five) minutes as needed for chest pain. Reported on 06/01/2015 12/19/16   Ann Held, DO  NON FORMULARY DNR comfort measures only    [provider]  ranitidine (ZANTAC) 150 MG tablet Take 1 tablet (150 mg total) by mouth 2 (two) times daily. 12/19/16   Roma Schanz R, DO  senna-docusate (SENOKOT-S) 8.6-50 MG tablet Take 1 tablet by mouth daily as needed for mild constipation. 12/19/16   Ann Held, DO  traMADol (ULTRAM) 50 MG tablet Take 1 tablet (50 mg total) every 8 (eight) hours as needed by mouth. 01/30/17   Ann Held, DO  trimethoprim (TRIMPEX) 100 MG tablet Take 1 tablet (100 mg total) daily by mouth. 02/13/17   Ann Held, DO    Physical Exam: Vitals:   03/06/17 2053 03/06/17 2201 03/06/17 2202 03/06/17 2357  BP: 126/76 104/73  94/67  Pulse:   100 66  Resp: (!) 30 (!) 35 (!) 26   Temp:    98.2 F (36.8 C)  TempSrc:    Axillary  SpO2:   (!) 79% 92%     General:  Appears calm and comfortable and is NAD; she is obtunded Eyes:   normal  lids ENT:  grossly normal lips  Neck:  no LAD, masses or thyromegaly Cardiovascular:   RRR, no m/r/g. No LE edema.  Respiratory:   CTA bilaterally with no wheezes/rales/rhonchi.  Normal respiratory effort. Abdomen:  soft, NT, ND, NABS Back:   kyphosis Skin: pressure ulcer on thoracic spine, covered with Allevyn Musculoskeletal:  no bony abnormality Lower extremity:  No LE edema.  Cool LE Psychiatric:  Obtunded, unresponsive Neurologic: Unable to assess    Radiological Exams on Admission: Ct Head Wo Contrast  Result Date: 03/06/2017 CLINICAL DATA:  Altered mental status  for 1 day EXAM: CT HEAD WITHOUT CONTRAST TECHNIQUE: Contiguous axial images were obtained from the base of the skull through the vertex without intravenous contrast. COMPARISON:  10/18/2015 FINDINGS: Brain: Diffuse atrophic changes are noted stable from prior exam. Previously seen right-sided subdural hematoma has resolved in the interval. Chronic white matter ischemic changes are noted. No acute hemorrhage or acute infarct is seen. There is a apparent 1.7 cm extra-axial lesion on the right adjacent to the frontal lobe. This likely represents a small meningioma. In retrospect it is stable in appearance from the prior exam. Vascular: No hyperdense vessel or unexpected calcification. Skull: Normal. Negative for fracture or focal lesion. Sinuses/Orbits: No acute finding. Other: None. IMPRESSION: Chronic changes without acute abnormality. Resolution of previously seen right-sided subdural hematoma. 1.7 cm extra-axial soft tissue lesion adjacent to the right frontal lobe likely representing a small meningioma. Electronically Signed   By: Inez Catalina M.D.   On: 03/06/2017 20:58   Dg Chest Portable 1 View  Result Date: 03/06/2017 CLINICAL DATA:  Altered mental status and cough with shortness of Breath EXAM: PORTABLE CHEST 1 VIEW COMPARISON:  01/19/2017 FINDINGS: Cardiac shadow is stable. Aortic calcifications are again seen. Hiatal  hernia is again noted. The lungs are well-aerated without focal infiltrate or sizable effusion. No acute bony abnormality is noted. IMPRESSION: No acute abnormality seen. Electronically Signed   By: Inez Catalina M.D.   On: 03/06/2017 20:14    EKG: Independently reviewed.  Sinus tachycardia with rate 109; nonspecific ST changes with no evidence of acute ischemia; NSCSLT   Labs on Admission: I have personally reviewed the available labs and imaging studies at the time of the admission.  Pertinent labs:   UA: large Hgb, moderate LE, few bacteria, TNTC WBC VBG: 7.322/42.4/21.7 Lactate 2.42 Na++ 148 Chl 119 CO2 21 Glucose 225 BUN 60/Creatinine 1.55/GFR 27 Albumin 3.3 WBC 27.8 11/13 urine culture with pansensitive E coli  Assessment/Plan Principal Problem:   Comfort measures only status Active Problems:   Essential hypertension, benign   Hypothyroidism   Dementia   Anxiety   Sepsis (HCC)   Cough   Acute lower UTI   Diarrhea   AKI (acute kidney injury) (HCC)   Hypernatremia   -Patient presenting with apparent sepsis, possibly related to UTI -After long discussion in the ER at [facility], family has decided to proceed with comfort care only -She was transferred to [facility] for comfort care and palliative care consult -Patient is likely to be a candidate for [employer] [facility] or other residential hospice, as she does not appear to be actively dying at this time -However, her reserve is likely quite low given her age and overall frailty -Comfort care order set utilized -No antibiotics or IVF as per family's request -Pain control with morphine as needed, would transition to a drip if needed but patient does not appear to be in pain at this time   DVT prophylaxis: None - comfort care Code Status: DNR - ER physician confirmed with family Family Communication: None present Disposition Plan: Likely transition to residential hospice Consults called: Palliative care Admission status: Admit - It is  my clinical opinion that admission to INPATIENT is reasonable and necessary because this patient will require at least 2 midnights in the hospital to treat this condition based on the medical complexity of the problems presented.  Given the aforementioned information, the predictability of an adverse outcome is felt to be significant.    Karmen Bongo MD [facility]  If note is  complete, please contact covering daytime or nighttime physician. [email or web address] Password [email or web address]  03/07/2017, 12:46 AM

## 2017-03-07 NOTE — Progress Notes (Signed)
Nutrition Brief Note  Chart reviewed. Pt now transitioning to comfort care.  No further nutrition interventions warranted at this time.  Please re-consult as needed.   Rual Vermeer A. Neala Miggins, RD, LDN, CDE Pager: [phone] After hours Pager: [phone]  

## 2017-03-07 NOTE — Consult Note (Signed)
Consultation Note Date: 03/07/2017   Patient Name: Virginia Davidson  DOB: 1916-08-23  MRN: [id number]  Age / Sex: 83 y.o., female  PCP: Ann Held, DO Referring Physician: Mendel Corning, MD  Reason for Consultation: Establishing goals of care and Terminal Care  HPI/Patient Profile: 83 y.o. female  with past medical history of dementia, hypertension, abdominal aneurysm, recurrent UTI's, and diverticulitis admitted on 03/06/2017 with AMS, cough, and congestion. In ED, patient with tachypnea, tachycardia, febrile 100.8, WBC 27.8, and lactic acid 2.42. Code sepsis initiated but after further discussion with family, the patient was admitted for comfort measures only. Palliative medicine consult for terminal care/hospice placement.   Clinical Assessment and Goals of Care: I have reviewed medical records, discussed with care team, and met with son Liliane Channel) and daughter-in-law (Diane) to discuss diagnosis, prognosis, GOC, EOL wishes, disposition and options. Another son, Herbie [place], was on speaker phone. Patient is lethargic. She will open eyes to voice but will not follow commands and does not participate in [place] conversation.   Introduced Palliative Medicine as specialized medical care for people living with serious illness. It focuses on providing relief from the symptoms and stress of a serious illness.  We discussed a brief life review of the patient. 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 She was diagnosed with dementia many years ago (10-15 years). She has lived with Liliane Channel and Diane who have been a huge support to her along with 24/7 caregivers. The family speaks of her progressive dementia in the last few years with recurrent infections and aspiration risk requiring a dysphagia diet/thickened liquids.    Prior to this hospitalization, PCP started  her on antibiotics for UTI for which she developed severe diarrhea. Liliane Channel speaks of her cognitive decline in the last week for which they thought she had a stroke. Diane tells me she has been refusing to swallow and it has taken over 2 hours to feed her.    Discussed hospital diagnoses and interventions. Discussed disease trajectory of dementia.   Advanced directives, concepts specific to code status, and artifical feeding and hydration were discussed. Living will reviewed. The family confirms DNR and her wishes against life prolonging measures. Diane and Liliane Channel speak of her being on hospice services in the past and have "been preparing ourselves."   The difference between aggressive medical intervention and comfort care was considered in light of the patient's goals of care. Kirby Funk, and Diane confirm focus on comfort measures with transition to hospice facility. Discussed hospice philosophy and focus on comfort, quality, and dignity at EOL. Discussed medications as needed for symptom management with goal to prevent re-hospitalization. Discussed prognosis days-weeks with infection and ongoing functional, cognitive, and nutritional status decline.   Questions and concerns were addressed. Emotional and spiritual support provided.    SUMMARY OF RECOMMENDATIONS    Comfort focused care.   Continue prn medications for symptom management  Comfort feeds per family request. Patient is on  a dysphagia diet/thickened liquids at home.   SW consult for residential hospice placement. Stable for transfer today if bed available.   Code Status/Advance Care Planning:  DNR  Symptom Management:   Continue prn Morphine, Robinul, and Haldol per attending.   Palliative Prophylaxis:   Aspiration, Delirium Protocol, Frequent Pain Assessment, Oral Care and Turn Reposition  Additional Recommendations (Limitations, Scope, Preferences):  Full Comfort Care  Psycho-social/Spiritual:   Desire for further  Chaplaincy support: yes  Additional Recommendations: Caregiving  Support/Resources and Education on Hospice  Prognosis:   < 2 weeks: sepsis secondary to UTI with underlying dementia and severe decline in functional/cognitive/nutritional status.   Discharge Planning: Hospice facility      Primary Diagnoses: Present on Admission: . Hypothyroidism . Essential hypertension, benign . Dementia . Anxiety . Acute lower UTI . Sepsis ([place]) . Diarrhea . Cough . AKI (acute kidney injury) ([place]) . Hypernatremia   I have reviewed the medical record, interviewed the patient and family, and examined the patient. The following aspects are pertinent.  Past Medical History:  Diagnosis Date  . Abdominal aneurysm ([place])   . Dementia   . Diverticulitis   . Glaucoma   . Hypertension   . Thyroid disease   . Urinary incontinence    Social History   Socioeconomic History  . Marital status: Widowed    Spouse name: None  . Number of children: None  . Years of education: None  . Highest education level: None  Social Needs  . Financial resource strain: None  . Food insecurity - worry: None  . Food insecurity - inability: None  . Transportation needs - medical: None  . Transportation needs - non-medical: None  Occupational History  . None  Tobacco Use  . Smoking status: Never Smoker  . Smokeless tobacco: Never Used  Substance and Sexual Activity  . Alcohol use: No  . Drug use: No  . Sexual activity: Not Currently  Other Topics Concern  . None  Social History Narrative   Walking -- exercise   Family History  Problem Relation Age of Onset  . Stroke Mother    Scheduled Meds: Continuous Infusions: PRN Meds:.acetaminophen **OR** acetaminophen, antiseptic oral rinse, diphenhydrAMINE, food thickener, glycopyrrolate **OR** glycopyrrolate **OR** glycopyrrolate, haloperidol **OR** haloperidol **OR** haloperidol lactate, loperamide, LORazepam **OR** LORazepam **OR** LORazepam, morphine  injection, ondansetron **OR** ondansetron (ZOFRAN) IV, polyvinyl alcohol Medications Prior to Admission:  Prior to Admission medications   Medication Sig Start Date End Date Taking? Authorizing Provider  ALPRAZolam Duanne Moron) 0.25 MG tablet TAKE ONE TABLET 3 TIMES A DAY AS NEEDED. 11/25/16   Carollee Herter, Alferd Apa, DO  Calcium Carbonate-Vitamin D 600-200 MG-UNIT CAPS Take 1 tablet by mouth 2 (two) times daily. 12/19/16   Ann Held, DO  ciclopirox (PENLAC) 8 % solution Apply topically at bedtime. Apply over nail and surrounding skin. Apply daily over previous coat. After seven (7) days, may remove with alcohol and continue cycle. 01/19/17   Saguier, Percell Miller, PA-C  ciprofloxacin (CIPRO) 250 MG tablet Take 1 tablet (250 mg total) 2 (two) times daily by mouth. For 5 days 02/09/17   Carollee Herter, Alferd Apa, DO  clindamycin (CLEOCIN) 75 MG capsule 1 tablet by mouth 3 times a day 12/19/16   Carollee Herter, Alferd Apa, DO  clindamycin (CLEOCIN) 75 MG capsule Take 1 capsule (75 mg total) by mouth 3 (three) times daily. 01/19/17   Saguier, Percell Miller, PA-C  haloperidol (HALDOL) 2 MG/ML solution TAKE 0.25ML BY MOUTH 3 TIMES DAILY 01/26/17  Mosie Lukes, MD  levothyroxine (SYNTHROID, LEVOTHROID) 75 MCG tablet Take 1 tablet (75 mcg total) by mouth daily. 12/19/16   Roma Schanz R, DO  lisinopril (PRINIVIL,ZESTRIL) 10 MG tablet TAKE ONE (1) TABLET BY MOUTH EVERY DAY 12/19/16   Carollee Herter, Alferd Apa, DO  mupirocin ointment (BACTROBAN) 2 % Apply to area twice daily 12/19/16   Carollee Herter, Alferd Apa, DO  nitroGLYCERIN (NITROSTAT) 0.4 MG SL tablet Place 1 tablet (0.4 mg total) under the tongue every 5 (five) minutes as needed for chest pain. Reported on 06/01/2015 12/19/16   Ann Held, DO  NON FORMULARY DNR comfort measures only    [provider]  ranitidine (ZANTAC) 150 MG tablet Take 1 tablet (150 mg total) by mouth 2 (two) times daily. 12/19/16   Roma Schanz R, DO  senna-docusate  (SENOKOT-S) 8.6-50 MG tablet Take 1 tablet by mouth daily as needed for mild constipation. 12/19/16   Ann Held, DO  traMADol (ULTRAM) 50 MG tablet Take 1 tablet (50 mg total) every 8 (eight) hours as needed by mouth. 01/30/17   Ann Held, DO  trimethoprim (TRIMPEX) 100 MG tablet Take 1 tablet (100 mg total) daily by mouth. 02/13/17   Carollee Herter, Alferd Apa, DO   Allergies  Allergen Reactions  . Penicillins Rash    Family is not sure of reaction as far as breathing issues are concerned  . Sulfamethoxazole Rash  . Alendronate Sodium     Unknown Reaction per caregiver  . Tetracyclines & Related     Unknown Reaction per caregiver   Review of Systems  Unable to perform ROS: Dementia   Physical Exam  Constitutional: She appears lethargic. She appears cachectic. She appears ill.  HENT:  Head: Normocephalic and atraumatic.  Pulmonary/Chest: No accessory muscle usage. No tachypnea. No respiratory distress.  shallow  Abdominal: Normal appearance. There is no tenderness.  Neurological: She appears lethargic.  Opens eyes to voice. Does not follow commands.  Skin: There is pallor.  Cool, mottling BLE  Psychiatric: Her speech is delayed. She is inattentive.  Nursing note and vitals reviewed.  Vital Signs: BP 94/67 (BP Location: Left Arm)   Pulse 66   Temp 98.2 F (36.8 C) (Axillary)   Resp (!) 32   SpO2 92%  Pain Assessment: PAINAD POSS *See Group Information*: S-Acceptable,Sleep, easy to arouse Pain Score: Asleep SpO2: SpO2: 92 % O2 Device:SpO2: 92 % O2 Flow Rate: .O2 Flow Rate (L/min): 3 L/min  IO: Intake/output summary:   Intake/Output Summary (Last 24 hours) at 03/07/2017 1212 Last data filed at 03/06/2017 2121 Gross per 24 hour  Intake 1750 ml  Output 40 ml  Net 1710 ml    LBM: Last BM Date: 03/06/17(per family) Baseline Weight:   Most recent weight:       Palliative Assessment/Data: PPS 20%   Flowsheet Rows     Most Recent Value  Intake Tab   Referral Department  Hospitalist  Unit at Time of Referral  Med/Surg Unit  Palliative Care Primary Diagnosis  Sepsis/Infectious Disease  Palliative Care Type  New Palliative care  Date first seen by Palliative Care  03/07/17  Clinical Assessment  Palliative Performance Scale Score  20%  Psychosocial & Spiritual Assessment  Palliative Care Outcomes  Patient/Family meeting held?  Yes  Who was at the meeting?  son and daughter-in-law  Palliative Care Outcomes  Clarified goals of care, Provided end of life care assistance, Provided psychosocial or spiritual  support, Transitioned to hospice, Improved pain interventions, Improved non-pain symptom therapy, Counseled regarding hospice      Time In: 1015 Time Out: 1125  Time Total: 66mn Greater than 50%  of this time was spent counseling and coordinating care related to the above assessment and plan.  Signed by:  MIhor Dow FNP-C Palliative Medicine Team  Phone: [phone]: [phone]  Please contact Palliative Medicine Team phone at [phone] questions and concerns.  For individual provider: See AShea Evans

## 2017-03-08 ENCOUNTER — Telehealth: Payer: Self-pay | Admitting: Family Medicine

## 2017-03-08 DIAGNOSIS — N179 Acute kidney failure, unspecified: Secondary | ICD-10-CM

## 2017-03-08 DIAGNOSIS — N390 Urinary tract infection, site not specified: Secondary | ICD-10-CM

## 2017-03-08 DIAGNOSIS — Z7189 Other specified counseling: Secondary | ICD-10-CM

## 2017-03-08 DIAGNOSIS — Z515 Encounter for palliative care: Secondary | ICD-10-CM

## 2017-03-08 DIAGNOSIS — J189 Pneumonia, unspecified organism: Secondary | ICD-10-CM

## 2017-03-08 MED ORDER — LORAZEPAM 2 MG/ML PO CONC: 1.0000 mg | Freq: Three times a day (TID) | ORAL | 0 refills | Status: AC | PRN

## 2017-03-08 MED ORDER — MORPHINE SULFATE (CONCENTRATE) 10 MG /0.5 ML PO SOLN: 5.0000 mg | Freq: Four times a day (QID) | ORAL | 0 refills | Status: AC | PRN

## 2017-03-08 NOTE — Progress Notes (Signed)
Daily Progress Note   Patient Name: Virginia Davidson       Date: 03/08/2017 DOB: 06-01-1916  Age: 83 y.o. MRN#: [id number] Attending Physician: Modena Jansky, MD Primary Care Physician: Carollee Herter, Alferd Apa, DO Admit Date: 03/06/2017  Reason for Consultation/Follow-up: Establishing goals of care and Terminal Care  Subjective: Patient in bed. Appears comfortable. Daughter in law at bedside. Tells me they are questioning their decision to transition to comfort care.  She called her husband and put him on speaker phone. We discussed what lead to their decision and why they were questioning it. They are questioning decision because patient "looks" better. Her breathing is better, she is eating bites and sips. Discussed that her breathing is better d/t administration of comfort medications. We talked about GOC and quality of life. Dtr in law discussed that patient's quality of life was poor and she was eating and drinking very little prior to admission. We discussed appreciating "golden moments" and the concept of "rallying" when patient's are close to dying and experience surges of energy. We discussed that making her comfortable allows her to spend quality time with family. They have also discussed decision with patient's primary care MD who is in agreement with Hospice care. They would like to continue with residential hospice placement.   Review of Systems  Unable to perform ROS: Acuity of condition    Length of Stay: 2  Current Medications: Scheduled Meds:    Continuous Infusions:   PRN Meds: acetaminophen **OR** acetaminophen, antiseptic oral rinse, diphenhydrAMINE, food thickener, glycopyrrolate **OR** glycopyrrolate **OR** glycopyrrolate, haloperidol **OR** haloperidol **OR**  haloperidol lactate, loperamide, LORazepam **OR** LORazepam **OR** LORazepam, morphine injection, ondansetron **OR** ondansetron (ZOFRAN) IV, polyvinyl alcohol  Physical Exam  Constitutional:  Cachetic, frail  Cardiovascular: Normal rate.  Pulmonary/Chest: Effort normal.  Neurological:  Asleep  Skin: There is pallor.  Nursing note and vitals reviewed.           Vital Signs: BP 121/67 (BP Location: Right Arm)   Pulse 90   Temp 99.1 F (37.3 C) (Axillary)   Resp (!) 32   SpO2 97%  SpO2: SpO2: 97 % O2 Device: O2 Device: Not Delivered O2 Flow Rate: O2 Flow Rate (L/min): 3 L/min  Intake/output summary:   Intake/Output Summary (Last 24 hours) at 03/08/2017 1050 Last data filed at 03/08/2017  1740 Gross per 24 hour  Intake 380 ml  Output 275 ml  Net 105 ml   LBM: Last BM Date: 03/06/17 Baseline Weight:   Most recent weight:         Palliative Assessment/Data: PPS: 20%    Flowsheet Rows     Most Recent Value  Intake Tab  Referral Department  Hospitalist  Unit at Time of Referral  Med/Surg Unit  Palliative Care Primary Diagnosis  Sepsis/Infectious Disease  Date Notified  03/07/17  Palliative Care Type  New Palliative care  Reason for referral  Clarify Goals of Care, End of Life Care Assistance  Date of Admission  03/06/17  Date first seen by Palliative Care  03/07/17  # of days Palliative referral response time  0 Day(s)  # of days IP prior to Palliative referral  1  Clinical Assessment  Palliative Performance Scale Score  20%  Psychosocial & Spiritual Assessment  Palliative Care Outcomes  Patient/Family meeting held?  Yes  Who was at the meeting?  son and daughter-in-law  Palliative Care Outcomes  Clarified goals of care, Provided end of life care assistance, Provided psychosocial or spiritual support, Transitioned to hospice, Improved pain interventions, Improved non-pain symptom therapy, Counseled regarding hospice      Patient Active Problem List   Diagnosis  Date Noted  . Palliative care by specialist   . Comfort measures only status 03/06/2017  . Pressure injury of skin 03/06/2017  . Acute lower UTI 03/06/2017  . Diarrhea 03/06/2017  . AKI (acute kidney injury) ([place]) 03/06/2017  . Hypernatremia 03/06/2017  . Vaginal odor 01/30/2017  . Open wound of left wrist 11/22/2016  . Open wound of right forearm 11/22/2016  . Cough 06/04/2015  . Urinary retention 05/25/2015  . Influenza A (H1N1) 05/24/2015  . Sepsis ([place]) 05/23/2015  . Acute respiratory failure ([place]) 05/23/2015  . HCAP (healthcare-associated pneumonia) 05/23/2015  . Intertrochanteric fracture of right hip S/P ORIF and IM screw 11/29/2013  . Essential hypertension, benign 11/29/2013  . Hypothyroidism 11/29/2013  . GERD (gastroesophageal reflux disease) 11/29/2013  . Dementia 11/29/2013  . Anxiety 11/29/2013  . Acute blood loss anemia 11/29/2013  . Lower urinary tract infectious disease 11/29/2013  . Unspecified constipation 11/29/2013    Palliative Care Assessment & Plan   Patient Profile: 83 y.o. female  with past medical history of dementia, hypertension, abdominal aneurysm, recurrent UTI's, and diverticulitis admitted on 03/06/2017 with AMS, cough, and congestion. In ED, patient with tachypnea, tachycardia, febrile 100.8, WBC 27.8, and lactic acid 2.42. Code sepsis initiated but after further discussion with family, the patient was admitted for comfort measures only. Palliative medicine consult for terminal care/hospice placement.   Assessment/Recommendations/Plan   Continue current comfort measures  Planned d/c to residential hospice this afternoon  Goals of Care and Additional Recommendations:  Limitations on Scope of Treatment: Full Comfort Care  Code Status:  DNR  Prognosis:   < 2 weeks  Discharge Planning:  Hospice facility  Care plan was discussed with patient's daughter in law and son.  Thank you for allowing the Palliative Medicine Team to assist  in the care of this patient.   Time In: 1015 Time Out: 1050 Total Time 35 minutes Prolonged Time Billed no      Greater than 50%  of this time was spent counseling and coordinating care related to the above assessment and plan.  Mariana Kaufman, AGNP-C Palliative Medicine   Please contact Palliative Medicine Team phone at [phone] for questions and concerns.

## 2017-03-08 NOTE — Telephone Encounter (Signed)
Copied from [place]. Topic: Quick Communication - See Telephone Encounter >> Mar 08, 2017  9:38 AM Antonieta Iba C wrote: CRM for notification. See Telephone encounter for: pt's daughter in law called in to be advised. She would like a call back. She said that she want provider to look at pt's lab work and advise on if they (family) should continue to have pt treated? She would like a call back at: [phone]   03/08/17.

## 2017-03-08 NOTE — Progress Notes (Signed)
Pt discharged to home with her family and Hospice Services at home.  Pt was transported via ambulance.  Discharge instructions and education given to son and his wife.

## 2017-03-08 NOTE — Consult Note (Signed)
            [facility] CM Primary Care Navigator  03/08/2017  Kataleia Quaranta 1916-11-10 [id number]   Attempt to see patient at the bedside to identify possible discharge needs. Per chart review, patient is 83 years old with history of dementia, hypertension, abdominal aneurysm, recurrent UTI's, and diverticulitis, who was admitted on 12/10/2018with altered mental status, cough, and congestion. Code sepsis initiated but after further discussion with family, the patient was admitted for comfort measures only.   Palliative care consulted for terminal care/ hospice placement. Per RN report, patient continues on current comfort measures. Planned discharge is to a residential hospice [facility]) today.  No further health management neededat this point.   For additional questions please contact:  Edwena Felty A. Jullien Granquist, BSN, RN-BC [facility] PRIMARY CARE Navigator Cell: [phone]

## 2017-03-08 NOTE — Discharge Summary (Signed)
Physician Discharge Summary  Virginia Davidson ZOX:[id number] DOB: Feb 12, 1917  PCP: Ann Held, DO  Admit date: 03/06/2017 Discharge date: 03/08/2017  Recommendations for Outpatient Follow-up:  1. M.D. at [facility] [place] for ongoing home hospice care.   Home Health: Not applicable Equipment/Devices: None    Discharge Condition: Guarded with expected decline and death. CODE STATUS: DO NOT RESUSCITATE  Diet recommendation: Comfort feeds of choice.  Discharge Diagnoses:  Principal Problem:   Comfort measures only status Active Problems:   Essential hypertension, benign   Hypothyroidism   Dementia   Anxiety   Lower urinary tract infectious disease   Sepsis ([place])   Cough   Acute lower UTI   Diarrhea   AKI (acute kidney injury) ([place])   Hypernatremia   Palliative care by specialist   Community acquired pneumonia   Terminal care   Advance care planning   Brief Summary: 83 year old female with PMH of advanced dementia, recurrent UTIs, abdominal aneurysm, HTN, hypothyroid, has been living with a son and daughter-in-law for the last approximately 4 months and as per discussion with daughter-in-law today, patient has been mostly bedbound, occasionally walked with assistance, did not recognize family, fluctuating oral intake, poor quality of life and gradually declining, presented to [facility] as a transfer from [place] due to worsening cough, congestion, altered mental status that worsened throughout the day. Recently treated for UTI with Cipro but then developed diarrhea and Cipro was stopped. In the ED she was assessed as a apparent sepsis possibly related to UTI. After long discussion in the ED at [facility], family decided to proceed with comfort care only. She was transferred to [facility] for comfort care and palliative care consultation. Only medications pertinent to comfort were initiated. Palliative care team consulted and followed up again  today. Some family members seemed to have second thoughts whether they had made the appropriate decision. I discussed in detail with patient's daughter-in-law at bedside and with patient's son extensively via phone at bedside. I advised them that given her very advanced age, frail physical status, advanced dementia, progressively declining and poor quality of life, current acute illness complicating multiple comorbidities, treating her for current acute illness may only be a temporizing measure but she will likely continue to progressively decline and advised them that hospice route was appropriate. Family had also communicated with patient's prior PCP who agreed with hospice care. Family agreeable to hospice. She apparently has been under hospice care in the past but was able to discharge from hospice. Residential hospice was initially explored but as per discussion with clinical social worker, patient has been approved for home hospice and family is on board with this disposition.  She had marked leukocytosis likely related to her UTI but given history of diarrhea and recent antibiotic exposure, C. difficile not entirely excluded. She also has acute kidney injury probably related to hypernatremic dehydration due to poor oral intake. Hyperglycemia noted, DM not excluded.   Consultations:  Palliative care medicine  Procedures:  Foley catheter, for comfort.   Discharge Instructions  Discharge Instructions    Bed rest   Complete by:  As directed    Call MD for:  difficulty breathing, headache or visual disturbances   Complete by:  As directed    Call MD for:  severe uncontrolled pain   Complete by:  As directed    Diet general   Complete by:  As directed    Comfort feeds of choice.  Medication List    STOP taking these medications   ciclopirox 8 % solution Commonly known as:  PENLAC   CULTURELLE PO   DELSYM COUGH/COLD NIGHT TIME 12.5-5-325 MG/10ML Liqd Generic drug:   Diphenhydramine-PE-APAP   haloperidol 2 MG/ML solution Commonly known as:  HALDOL   levothyroxine 75 MCG tablet Commonly known as:  SYNTHROID, LEVOTHROID   lisinopril 10 MG tablet Commonly known as:  PRINIVIL,ZESTRIL   nitroGLYCERIN 0.4 MG SL tablet Commonly known as:  NITROSTAT   ranitidine 150 MG tablet Commonly known as:  ZANTAC   senna-docusate 8.6-50 MG tablet Commonly known as:  Senokot-S   trimethoprim 100 MG tablet Commonly known as:  TRIMPEX     TAKE these medications   LORazepam 2 MG/ML concentrated solution Commonly known as:  ATIVAN Place 0.5 mLs (1 mg total) under the tongue every 8 (eight) hours as needed for anxiety.   morphine CONCENTRATE 10 mg / 0.5 ml concentrated solution Place 0.25 mLs (5 mg total) under the tongue every 6 (six) hours as needed for moderate pain, severe pain or shortness of breath.   NON FORMULARY DNR comfort measures only      Follow-up Information    MD with [facility] [place] Follow up.   Why:  Follow-up regarding Home hospice care.       Roma Schanz R, DO Follow up.   Specialty:  Family Medicine Why:  [place] [facility] information: [address] [place] [address] [phone]          Allergies  Allergen Reactions  . Penicillins Rash    Family is not sure of reaction as far as breathing issues are concerned  . Sulfamethoxazole Rash  . Alendronate Sodium     Unknown Reaction per caregiver  . Tetracyclines & Related     Unknown Reaction per caregiver      Procedures/Studies: Ct Head Wo Contrast  Result Date: 03/06/2017 CLINICAL DATA:  Altered mental status for 1 day EXAM: CT HEAD WITHOUT CONTRAST TECHNIQUE: Contiguous axial images were obtained from the base of the skull through the vertex without intravenous contrast. COMPARISON:  10/18/2015 FINDINGS: Brain: Diffuse atrophic changes are noted stable from prior exam. Previously seen right-sided subdural hematoma has  resolved in the interval. Chronic white matter ischemic changes are noted. No acute hemorrhage or acute infarct is seen. There is a apparent 1.7 cm extra-axial lesion on the right adjacent to the frontal lobe. This likely represents a small meningioma. In retrospect it is stable in appearance from the prior exam. Vascular: No hyperdense vessel or unexpected calcification. Skull: Normal. Negative for fracture or focal lesion. Sinuses/Orbits: No acute finding. Other: None. IMPRESSION: Chronic changes without acute abnormality. Resolution of previously seen right-sided subdural hematoma. 1.7 cm extra-axial soft tissue lesion adjacent to the right frontal lobe likely representing a small meningioma. Electronically Signed   By: Inez Catalina M.D.   On: 03/06/2017 20:58   Dg Chest Portable 1 View  Result Date: 03/06/2017 CLINICAL DATA:  Altered mental status and cough with shortness of Breath EXAM: PORTABLE CHEST 1 VIEW COMPARISON:  01/19/2017 FINDINGS: Cardiac shadow is stable. Aortic calcifications are again seen. Hiatal hernia is again noted. The lungs are well-aerated without focal infiltrate or sizable effusion. No acute bony abnormality is noted. IMPRESSION: No acute abnormality seen. Electronically Signed   By: Inez Catalina M.D.   On: 03/06/2017 20:14      Subjective: Patient is nonverbal, has her eyes closed and  does not respond to any questions or instructions.  Discharge Exam:  Vitals:   03/06/17 2357 03/07/17 0800 03/07/17 1452 03/08/17 0503  BP: 94/67  (!) 100/57 121/67  Pulse: 66  82 90  Resp:  (!) 32 (!) 32   Temp: 98.2 F (36.8 C)  98.4 F (36.9 C) 99.1 F (37.3 C)  TempSrc: Axillary  Axillary Axillary  SpO2: 92%  92% 97%    General: Pleasant elderly female, small built and frail, lying comfortably propped up in bed. Does not appear to be in any distress. Cardiovascular: S1 & S2 heard, RRR, S1/S2 +. No murmurs, rubs, gallops or clicks. No JVD or pedal edema. Respiratory: Poor  inspiratory effort but seems clear to auscultation. No increased work of breathing. Abdominal:  Non distended, non tender & soft. No organomegaly or masses appreciated. Normal bowel sounds heard. CNS: Mental status as indicated above. No obvious focal neurological deficits.  Extremities: no edema, no cyanosis    The results of significant diagnostics from this hospitalization (including imaging, microbiology, ancillary and laboratory) are listed below for reference.     Microbiology: Recent Results (from the past 240 hour(s))  Blood culture (routine x 2)     Status: None (Preliminary result)   Collection Time: 03/06/17  7:55 PM  Result Value Ref Range Status   Specimen Description BLOOD BLOOD LEFT HAND  Final   Special Requests IN PEDIATRIC BOTTLE Blood Culture adequate volume  Final   Culture   Final    NO GROWTH < 24 HOURS Performed at [facility], [address] [place], [place] [address]    Report Status PENDING  Incomplete  Blood culture (routine x 2)     Status: None (Preliminary result)   Collection Time: 03/06/17  8:00 PM  Result Value Ref Range Status   Specimen Description BLOOD BLOOD RIGHT WRIST  Final   Special Requests IN PEDIATRIC BOTTLE Blood Culture adequate volume  Final   Culture   Final    NO GROWTH < 24 HOURS Performed at [facility], [place] [address] [place], [place] [address]    Report Status PENDING  Incomplete     Labs: CBC: Recent Labs  Lab 03/06/17 1956  WBC 27.8*  NEUTROABS 24.7*  HGB 15.6*  HCT 48.0*  MCV 94.5  PLT 355   Basic Metabolic Panel: Recent Labs  Lab 03/06/17 1956  NA 148*  K 4.4  CL 119*  CO2 21*  GLUCOSE 225*  BUN 60*  CREATININE 1.55*  CALCIUM 10.1   Liver Function Tests: Recent Labs  Lab 03/06/17 1956  AST 20  ALT 13*  ALKPHOS 79  BILITOT 0.6  PROT 6.1*  ALBUMIN 3.3*   Urinalysis    Component Value Date/Time   COLORURINE YELLOW 03/06/2017 2020   APPEARANCEUR TURBID (A) 03/06/2017 2020    LABSPEC 1.025 03/06/2017 2020   PHURINE 5.5 03/06/2017 2020   GLUCOSEU NEGATIVE 03/06/2017 2020   GLUCOSEU NEGATIVE 11/26/2014 1457   HGBUR LARGE (A) 03/06/2017 2020   BILIRUBINUR NEGATIVE 03/06/2017 2020   BILIRUBINUR neg 02/07/2017 [place] NEGATIVE 03/06/2017 2020   PROTEINUR NEGATIVE 03/06/2017 2020   UROBILINOGEN 0.2 02/07/2017 1928   UROBILINOGEN 0.2 11/26/2014 1457   NITRITE NEGATIVE 03/06/2017 2020   LEUKOCYTESUR MODERATE (A) 03/06/2017 2020   I discussed in detail with patient's daughter-in-law at bedside. Patient has been currently living with her and her husband for the last 4 months. At daughter-in-law's request, I also discussed with  one of patient's son via speaker phone in detail and updated all care and answered all questions.   Time coordinating discharge: Over 30 minutes  SIGNED:  Vernell Leep, MD, FACP, [facility]. [facility] Pager [phone]  If 7PM-7AM, please contact night-coverage [email or web address] Password [facility] 03/08/2017, 12:55 PM

## 2017-03-08 NOTE — Social Work (Addendum)
CSW met with pt and family at bedside to confirm desire to transfer pt to residential hospice care. Pt and family desire is to transfer to [facility], CSW will follow up with [facility] [place] to facilitate transfer.   10:37am- CSW in contact with [facility] [place], they are going to contact son Liliane Channel & daughter in law Diane.  12:45pm- CSW informed that pt is not appropriate for [facility] but will be followed by [facility] at home.   CSW has informed MD & RN Case Manager.   CSW signing off. Please consult if any additional needs arise.  Alexander Mt, [place] Work [phone]    Alexander Mt, [place] Work [phone]

## 2017-03-08 NOTE — Care Management Note (Addendum)
Case Management Note  Patient Details  Name: Virginia Davidson MRN: [id number] Date of Birth: 03-16-1917  Subjective/Objective:                    Action/Plan: Asked nurse if ready for transport to be called , she is. Family at bedside they are also ready and wish to stay and follow [facility]. [facility] called asked estimated time of arrival " not long at all". Bedside nurse and family aware.  Nunzio Cory with [facility] assessed patient spoke with family at bedside, instead of patient going to [facility] [place] , she will discharge to home with home [facility] [place].   Patient already has all DME. Patient has 24 hour care at home.  Confirmed face sheet information with Diane , will arrange ambulance transport home.  Expected Discharge Date:  03/08/17               Expected Discharge Plan:  Home w Hospice Care  In-House Referral:     Discharge planning Services  CM Consult  Post Acute Care Choice:    Choice offered to:  Adult Children  DME Arranged:    DME Agency:     HH Arranged:    HH Agency:  [place]  Status of Service:  Completed, signed off  If discussed at [employer] of Stay Meetings, dates discussed:    Additional Comments:  Marilu Favre, RN 03/08/2017, 1:09 PM

## 2017-03-09 DIAGNOSIS — G301 Alzheimer's disease with late onset: Secondary | ICD-10-CM | POA: Diagnosis not present

## 2017-03-09 DIAGNOSIS — N390 Urinary tract infection, site not specified: Secondary | ICD-10-CM | POA: Diagnosis not present

## 2017-03-09 DIAGNOSIS — Z681 Body mass index (BMI) 19 or less, adult: Secondary | ICD-10-CM | POA: Diagnosis not present

## 2017-03-09 DIAGNOSIS — F0280 Dementia in other diseases classified elsewhere without behavioral disturbance: Secondary | ICD-10-CM | POA: Diagnosis not present

## 2017-03-09 DIAGNOSIS — A419 Sepsis, unspecified organism: Secondary | ICD-10-CM | POA: Diagnosis not present

## 2017-03-09 DIAGNOSIS — I10 Essential (primary) hypertension: Secondary | ICD-10-CM | POA: Diagnosis not present

## 2017-03-09 DIAGNOSIS — L89152 Pressure ulcer of sacral region, stage 2: Secondary | ICD-10-CM | POA: Diagnosis not present

## 2017-03-09 DIAGNOSIS — R1310 Dysphagia, unspecified: Secondary | ICD-10-CM | POA: Diagnosis not present

## 2017-03-09 DIAGNOSIS — R634 Abnormal weight loss: Secondary | ICD-10-CM | POA: Diagnosis not present

## 2017-03-09 NOTE — Telephone Encounter (Signed)
I saw she was admitted to hospice from the hospital ---  It is completely up to them but I agree with hospice Keep her comfortable-- I would not force anything on her

## 2017-03-10 ENCOUNTER — Telehealth: Payer: Self-pay | Admitting: *Deleted

## 2017-03-10 DIAGNOSIS — F0280 Dementia in other diseases classified elsewhere without behavioral disturbance: Secondary | ICD-10-CM | POA: Diagnosis not present

## 2017-03-10 DIAGNOSIS — R1310 Dysphagia, unspecified: Secondary | ICD-10-CM | POA: Diagnosis not present

## 2017-03-10 DIAGNOSIS — N390 Urinary tract infection, site not specified: Secondary | ICD-10-CM | POA: Diagnosis not present

## 2017-03-10 DIAGNOSIS — G301 Alzheimer's disease with late onset: Secondary | ICD-10-CM | POA: Diagnosis not present

## 2017-03-10 DIAGNOSIS — L89152 Pressure ulcer of sacral region, stage 2: Secondary | ICD-10-CM | POA: Diagnosis not present

## 2017-03-10 DIAGNOSIS — A419 Sepsis, unspecified organism: Secondary | ICD-10-CM | POA: Diagnosis not present

## 2017-03-10 NOTE — Telephone Encounter (Signed)
That is fine 

## 2017-03-10 NOTE — Telephone Encounter (Signed)
Spoke with Constance Holster at [facility] to notify her of below and she states they had to go ahead and enroll pt under the inhouse attending physician and will continue to forward communication to Dr Carollee Herter for continuity of care.

## 2017-03-10 NOTE — Telephone Encounter (Signed)
Left message on machine to call back  

## 2017-03-10 NOTE — Telephone Encounter (Signed)
Copied from [place]. Topic: General - Other >> Mar 08, 2017  1:50 PM Marin Olp L wrote: Reason for CRM: Wants to know if Dr. Etter Sjogren wast's to be the attending physician for the patient while she is in hospice?

## 2017-03-11 DIAGNOSIS — N390 Urinary tract infection, site not specified: Secondary | ICD-10-CM | POA: Diagnosis not present

## 2017-03-11 DIAGNOSIS — A419 Sepsis, unspecified organism: Secondary | ICD-10-CM | POA: Diagnosis not present

## 2017-03-11 DIAGNOSIS — R1310 Dysphagia, unspecified: Secondary | ICD-10-CM | POA: Diagnosis not present

## 2017-03-11 DIAGNOSIS — F0280 Dementia in other diseases classified elsewhere without behavioral disturbance: Secondary | ICD-10-CM | POA: Diagnosis not present

## 2017-03-11 DIAGNOSIS — L89152 Pressure ulcer of sacral region, stage 2: Secondary | ICD-10-CM | POA: Diagnosis not present

## 2017-03-11 DIAGNOSIS — G301 Alzheimer's disease with late onset: Secondary | ICD-10-CM | POA: Diagnosis not present

## 2017-03-11 LAB — CULTURE, BLOOD (ROUTINE X 2)
Culture: NO GROWTH
Culture: NO GROWTH
Special Requests: ADEQUATE
Special Requests: ADEQUATE

## 2017-03-14 NOTE — Telephone Encounter (Signed)
See other phone note

## 2017-03-28 DEATH — deceased
